# Patient Record
Sex: Male | Born: 1970 | Race: White | Hispanic: No | Marital: Married | Smoking: Current some day smoker
Health system: Southern US, Community
[De-identification: ages and names within clinical notes are randomized; demographics above are authoritative.]

## PROBLEM LIST (undated history)

## (undated) DIAGNOSIS — R51 Headache: Secondary | ICD-10-CM

## (undated) DIAGNOSIS — R519 Headache, unspecified: Secondary | ICD-10-CM

## (undated) DIAGNOSIS — I214 Non-ST elevation (NSTEMI) myocardial infarction: Secondary | ICD-10-CM

## (undated) DIAGNOSIS — E7800 Pure hypercholesterolemia, unspecified: Secondary | ICD-10-CM

## (undated) DIAGNOSIS — I2510 Atherosclerotic heart disease of native coronary artery without angina pectoris: Secondary | ICD-10-CM

## (undated) HISTORY — PX: CARDIAC CATHETERIZATION: SHX172

## (undated) HISTORY — PX: TONSILLECTOMY: SUR1361

## (undated) HISTORY — PX: CHOLECYSTECTOMY: SHX55

---
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
## 2015-07-28 DIAGNOSIS — I214 Non-ST elevation (NSTEMI) myocardial infarction: Secondary | ICD-10-CM

## 2015-07-28 HISTORY — DX: Non-ST elevation (NSTEMI) myocardial infarction: I21.4

## 2015-07-29 ENCOUNTER — Inpatient Hospital Stay (HOSPITAL_COMMUNITY)
Admission: EM | Admit: 2015-07-29 | Discharge: 2015-08-01 | DRG: 247 | Disposition: A | Discharge status: Home / Self Care | Payer: BLUE CROSS/BLUE SHIELD | Attending: Cardiology | Admitting: Cardiology

## 2015-07-29 ENCOUNTER — Emergency Department (HOSPITAL_COMMUNITY): Payer: BLUE CROSS/BLUE SHIELD

## 2015-07-29 ENCOUNTER — Encounter (HOSPITAL_COMMUNITY)
Admission: EM | Disposition: A | Discharge status: Home / Self Care | Payer: Self-pay | Source: Home / Self Care | Attending: Cardiology

## 2015-07-29 ENCOUNTER — Encounter (HOSPITAL_COMMUNITY): Payer: Self-pay | Admitting: Emergency Medicine

## 2015-07-29 DIAGNOSIS — E668 Other obesity: Secondary | ICD-10-CM | POA: Diagnosis present

## 2015-07-29 DIAGNOSIS — I2582 Chronic total occlusion of coronary artery: Secondary | ICD-10-CM | POA: Diagnosis not present

## 2015-07-29 DIAGNOSIS — R079 Chest pain, unspecified: Secondary | ICD-10-CM | POA: Diagnosis not present

## 2015-07-29 DIAGNOSIS — I214 Non-ST elevation (NSTEMI) myocardial infarction: Principal | ICD-10-CM | POA: Diagnosis present

## 2015-07-29 DIAGNOSIS — T464X5A Adverse effect of angiotensin-converting-enzyme inhibitors, initial encounter: Secondary | ICD-10-CM | POA: Diagnosis not present

## 2015-07-29 DIAGNOSIS — E785 Hyperlipidemia, unspecified: Secondary | ICD-10-CM | POA: Diagnosis present

## 2015-07-29 DIAGNOSIS — E669 Obesity, unspecified: Secondary | ICD-10-CM | POA: Diagnosis present

## 2015-07-29 DIAGNOSIS — G4733 Obstructive sleep apnea (adult) (pediatric): Secondary | ICD-10-CM | POA: Diagnosis present

## 2015-07-29 DIAGNOSIS — R001 Bradycardia, unspecified: Secondary | ICD-10-CM | POA: Diagnosis not present

## 2015-07-29 DIAGNOSIS — I2510 Atherosclerotic heart disease of native coronary artery without angina pectoris: Secondary | ICD-10-CM | POA: Diagnosis present

## 2015-07-29 DIAGNOSIS — Z955 Presence of coronary angioplasty implant and graft: Secondary | ICD-10-CM | POA: Insufficient documentation

## 2015-07-29 DIAGNOSIS — Z6838 Body mass index (BMI) 38.0-38.9, adult: Secondary | ICD-10-CM | POA: Diagnosis not present

## 2015-07-29 DIAGNOSIS — G459 Transient cerebral ischemic attack, unspecified: Secondary | ICD-10-CM | POA: Diagnosis present

## 2015-07-29 DIAGNOSIS — I25110 Atherosclerotic heart disease of native coronary artery with unstable angina pectoris: Secondary | ICD-10-CM | POA: Diagnosis present

## 2015-07-29 HISTORY — DX: Pure hypercholesterolemia, unspecified: E78.00

## 2015-07-29 HISTORY — DX: Non-ST elevation (NSTEMI) myocardial infarction: I21.4

## 2015-07-29 HISTORY — DX: Headache: R51

## 2015-07-29 HISTORY — DX: Headache, unspecified: R51.9

## 2015-07-29 HISTORY — DX: Atherosclerotic heart disease of native coronary artery without angina pectoris: I25.10

## 2015-07-29 HISTORY — PX: CARDIAC CATHETERIZATION: SHX172

## 2015-07-29 LAB — BASIC METABOLIC PANEL
Anion gap: 7 (ref 5–15)
BUN: 14 mg/dL (ref 6–20)
CHLORIDE: 105 mmol/L (ref 101–111)
CO2: 26 mmol/L (ref 22–32)
Calcium: 9.2 mg/dL (ref 8.9–10.3)
Creatinine, Ser: 0.94 mg/dL (ref 0.61–1.24)
GFR calc non Af Amer: >60 mL/min (ref >60)
Glucose, Bld: 116 mg/dL — ABNORMAL HIGH (ref 65–99)
POTASSIUM: 3.6 mmol/L (ref 3.5–5.1)
SODIUM: 138 mmol/L (ref 135–145)

## 2015-07-29 LAB — CBC
HEMATOCRIT: 42.3 % (ref 39.0–52.0)
Hemoglobin: 14 g/dL (ref 13.0–17.0)
MCH: 30.1 pg (ref 26.0–34.0)
MCHC: 33.1 g/dL (ref 30.0–36.0)
MCV: 91 fL (ref 78.0–100.0)
PLATELETS: 382 10*3/uL (ref 150–400)
RBC: 4.65 MIL/uL (ref 4.22–5.81)
RDW: 13.2 % (ref 11.5–15.5)
WBC: 11.3 10*3/uL — AB (ref 4.0–10.5)

## 2015-07-29 LAB — TROPONIN I: Troponin I: 7.88 ng/mL (ref <0.031)

## 2015-07-29 LAB — PROTIME-INR
INR: 1.02 (ref 0.00–1.49)
Prothrombin Time: 13.6 seconds (ref 11.6–15.2)

## 2015-07-29 LAB — APTT: APTT: 29 s (ref 24–37)

## 2015-07-29 SURGERY — LEFT HEART CATH AND CORONARY ANGIOGRAPHY

## 2015-07-29 MED ORDER — MORPHINE SULFATE (PF) 4 MG/ML IV SOLN
4.0000 mg | Freq: Once | INTRAVENOUS | Status: AC
  Administered 2015-07-29: 4 mg via INTRAVENOUS

## 2015-07-29 MED ORDER — SODIUM CHLORIDE 0.9 % IV SOLN: 250.0000 mL | INTRAVENOUS | Status: DC | PRN

## 2015-07-29 MED ORDER — MORPHINE SULFATE (PF) 4 MG/ML IV SOLN
INTRAVENOUS | Status: AC
  Filled 2015-07-29: qty 1

## 2015-07-29 MED ORDER — CLOPIDOGREL BISULFATE 75 MG PO TABS
300.0000 mg | ORAL_TABLET | Freq: Once | ORAL | Status: AC
Start: 2015-07-29 — End: 2015-07-29
  Administered 2015-07-29: 300 mg via ORAL
  Filled 2015-07-29: qty 4

## 2015-07-29 MED ORDER — VERAPAMIL HCL 2.5 MG/ML IV SOLN
INTRAVENOUS | Status: DC | PRN
  Administered 2015-07-29: 16:00:00 via INTRA_ARTERIAL

## 2015-07-29 MED ORDER — HEPARIN (PORCINE) IN NACL 100-0.45 UNIT/ML-% IJ SOLN
1600.0000 [IU]/h | INTRAMUSCULAR | Status: DC
  Administered 2015-07-30: 1400 [IU]/h via INTRAVENOUS
  Filled 2015-07-29 (×3): qty 250

## 2015-07-29 MED ORDER — NITROGLYCERIN IN D5W 200-5 MCG/ML-% IV SOLN: 10.0000 ug/min | INTRAVENOUS | Status: AC

## 2015-07-29 MED ORDER — SODIUM CHLORIDE 0.9 % WEIGHT BASED INFUSION: 1.0000 mL/kg/h | INTRAVENOUS | Status: AC

## 2015-07-29 MED ORDER — SODIUM CHLORIDE 0.9 % IJ SOLN: 3.0000 mL | INTRAMUSCULAR | Status: DC | PRN

## 2015-07-29 MED ORDER — HEPARIN SODIUM (PORCINE) 1000 UNIT/ML IJ SOLN
INTRAMUSCULAR | Status: AC
  Filled 2015-07-29: qty 1

## 2015-07-29 MED ORDER — FENTANYL CITRATE (PF) 100 MCG/2ML IJ SOLN
INTRAMUSCULAR | Status: AC
  Filled 2015-07-29: qty 4

## 2015-07-29 MED ORDER — ACETAMINOPHEN 325 MG PO TABS
650.0000 mg | ORAL_TABLET | ORAL | Status: DC | PRN
  Administered 2015-07-29: 19:00:00 650 mg via ORAL
  Filled 2015-07-29: qty 2

## 2015-07-29 MED ORDER — ASPIRIN 81 MG PO CHEW
CHEWABLE_TABLET | ORAL | Status: AC
  Filled 2015-07-29: qty 4

## 2015-07-29 MED ORDER — OXYCODONE-ACETAMINOPHEN 5-325 MG PO TABS
1.0000 | ORAL_TABLET | ORAL | Status: DC | PRN
  Administered 2015-07-29 – 2015-07-31 (×3): 2 via ORAL
  Filled 2015-07-29 (×3): qty 2

## 2015-07-29 MED ORDER — CLOPIDOGREL BISULFATE 75 MG PO TABS
75.0000 mg | ORAL_TABLET | Freq: Every day | ORAL | Status: DC
  Administered 2015-07-30 – 2015-08-01 (×3): 75 mg via ORAL
  Filled 2015-07-29 (×4): qty 1

## 2015-07-29 MED ORDER — ASPIRIN 81 MG PO CHEW
324.0000 mg | CHEWABLE_TABLET | Freq: Once | ORAL | Status: AC
  Administered 2015-07-29: 324 mg via ORAL

## 2015-07-29 MED ORDER — SODIUM CHLORIDE 0.9 % IJ SOLN
3.0000 mL | Freq: Two times a day (BID) | INTRAMUSCULAR | Status: DC
Start: 2015-07-29 — End: 2015-08-01
  Administered 2015-07-30 – 2015-07-31 (×2): 3 mL via INTRAVENOUS

## 2015-07-29 MED ORDER — LIDOCAINE HCL (PF) 1 % IJ SOLN
INTRAMUSCULAR | Status: DC | PRN
  Administered 2015-07-29: 17:00:00

## 2015-07-29 MED ORDER — MIDAZOLAM HCL 2 MG/2ML IJ SOLN
INTRAMUSCULAR | Status: DC | PRN
  Administered 2015-07-29: 1 mg via INTRAVENOUS

## 2015-07-29 MED ORDER — ACTIVE PARTNERSHIP FOR HEALTH OF YOUR HEART BOOK
Freq: Once | Status: AC
  Administered 2015-07-30: 04:00:00
  Filled 2015-07-29: qty 1

## 2015-07-29 MED ORDER — NITROGLYCERIN IN D5W 200-5 MCG/ML-% IV SOLN
5.0000 ug/min | Freq: Once | INTRAVENOUS | Status: AC
  Administered 2015-07-29: 5 ug/min via INTRAVENOUS
  Filled 2015-07-29: qty 250

## 2015-07-29 MED ORDER — HEPARIN (PORCINE) IN NACL 100-0.45 UNIT/ML-% IJ SOLN
1200.0000 [IU]/h | INTRAMUSCULAR | Status: DC
  Administered 2015-07-29: 1200 [IU]/h via INTRAVENOUS
  Filled 2015-07-29: qty 250

## 2015-07-29 MED ORDER — HEPARIN BOLUS VIA INFUSION
4000.0000 [IU] | Freq: Once | INTRAVENOUS | Status: AC
Start: 2015-07-29 — End: 2015-07-29
  Administered 2015-07-29: 4000 [IU] via INTRAVENOUS

## 2015-07-29 MED ORDER — NITROGLYCERIN 0.4 MG SL SUBL
0.4000 mg | SUBLINGUAL_TABLET | SUBLINGUAL | Status: DC | PRN
  Administered 2015-07-29: 0.4 mg via SUBLINGUAL

## 2015-07-29 MED ORDER — ONDANSETRON HCL 4 MG/2ML IJ SOLN: 4.0000 mg | Freq: Four times a day (QID) | INTRAMUSCULAR | Status: DC | PRN

## 2015-07-29 MED ORDER — NITROGLYCERIN 0.4 MG SL SUBL
SUBLINGUAL_TABLET | SUBLINGUAL | Status: AC
  Administered 2015-07-29: 0.4 mg via SUBLINGUAL
  Filled 2015-07-29: qty 1

## 2015-07-29 MED ORDER — ROSUVASTATIN CALCIUM 10 MG PO TABS
10.0000 mg | ORAL_TABLET | Freq: Every day | ORAL | Status: DC
  Administered 2015-07-30 – 2015-08-01 (×3): 10 mg via ORAL
  Filled 2015-07-29 (×3): qty 1

## 2015-07-29 MED ORDER — FENTANYL CITRATE (PF) 100 MCG/2ML IJ SOLN
INTRAMUSCULAR | Status: DC | PRN
  Administered 2015-07-29 (×2): 25 ug via INTRAVENOUS

## 2015-07-29 MED ORDER — MIDAZOLAM HCL 2 MG/2ML IJ SOLN
INTRAMUSCULAR | Status: AC
  Filled 2015-07-29: qty 4

## 2015-07-29 MED ORDER — LIDOCAINE HCL (PF) 1 % IJ SOLN
INTRAMUSCULAR | Status: AC
  Filled 2015-07-29: qty 30

## 2015-07-29 MED ORDER — ASPIRIN 81 MG PO CHEW
81.0000 mg | CHEWABLE_TABLET | Freq: Every day | ORAL | Status: DC
  Administered 2015-07-30 – 2015-08-01 (×3): 81 mg via ORAL
  Filled 2015-07-29 (×3): qty 1

## 2015-07-29 MED ORDER — HEPARIN SODIUM (PORCINE) 1000 UNIT/ML IJ SOLN
INTRAMUSCULAR | Status: DC | PRN
  Administered 2015-07-29: 6000 [IU] via INTRAVENOUS

## 2015-07-29 MED ORDER — VERAPAMIL HCL 2.5 MG/ML IV SOLN
INTRAVENOUS | Status: AC
  Filled 2015-07-29: qty 2

## 2015-07-29 SURGICAL SUPPLY — 9 items
CATH INFINITI 5 FR JL3.5 (CATHETERS) ×2 IMPLANT
CATH INFINITI JR4 5F (CATHETERS) ×2 IMPLANT
DEVICE RAD COMP TR BAND LRG (VASCULAR PRODUCTS) ×2 IMPLANT
GLIDESHEATH SLEND A-KIT 6F 22G (SHEATH) ×2 IMPLANT
KIT HEART LEFT (KITS) ×2 IMPLANT
PACK CARDIAC CATHETERIZATION (CUSTOM PROCEDURE TRAY) ×2 IMPLANT
TRANSDUCER W/STOPCOCK (MISCELLANEOUS) ×2 IMPLANT
TUBING CIL FLEX 10 FLL-RA (TUBING) ×2 IMPLANT
WIRE SAFE-T 1.5MM-J .035X260CM (WIRE) ×2 IMPLANT

## 2015-07-29 NOTE — H&P (View-Only) (Signed)
    Consulting cardiologist: Dr Tran Arzuaga MD  Clinical Summary Alex Barnes is a 44 y.o.male with no known cardiac disease presents with chest pain. 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 10/10 pressure pain at that time with some SOB, lasted about 45 minutes. Became very diaphoretic. Patient did not seek medical attention. Pain on and off throughout the night and into the morning, similar in character but more mild in severity.    No Known Allergies  Medications Scheduled Medications:     Infusions: . heparin 1,200 Units/hr (07/29/15 1404)     PRN Medications:  nitroGLYCERIN   Past Medical History  Diagnosis Date  . Hypercholesteremia     Past Surgical History  Procedure Laterality Date  . Tonsillectomy    . Wisdom tooth extraction    . Cardiac catheterization      History reviewed. No pertinent family history.  Social History Alex Barnes reports that he has never smoked. He does not have any smokeless tobacco history on file. Alex Barnes reports that he drinks alcohol.  Review of Systems CONSTITUTIONAL: No weight loss, fever, chills, weakness or fatigue.  HEENT: Eyes: No visual loss, blurred vision, double vision or yellow sclerae. No hearing loss, sneezing, congestion, runny nose or sore throat.  SKIN: No rash or itching.  CARDIOVASCULAR: per HPI RESPIRATORY: No shortness of breath, cough or sputum.  GASTROINTESTINAL: No anorexia, nausea, vomiting or diarrhea. No abdominal pain or blood.  GENITOURINARY: no polyuria, no dysuria NEUROLOGICAL: No headache, dizziness, syncope, paralysis, ataxia, numbness or tingling in the extremities. No change in bowel or bladder control.  MUSCULOSKELETAL: No muscle, back pain, joint pain or stiffness.  HEMATOLOGIC: No anemia, bleeding or bruising.  LYMPHATICS: No enlarged nodes. No history of splenectomy.  PSYCHIATRIC: No history of depression or  anxiety.      Physical Examination Blood pressure 127/75, pulse 61, temperature 98.6 F (37 C), temperature source Oral, resp. rate 16, height 5' 10" (1.778 m), weight 265 lb (120.203 kg), SpO2 98 %. No intake or output data in the 24 hours ending 07/29/15 1439  HEENT: sclera clear  Cardiovascular: RRR, no m/r/g, no JVD  Respiratory:CTAB  GI: abdomen soft, NT, ND  MSK: no LE edema  Neuro: no focal deficits  Psych: appropriate affect   Lab Results  Basic Metabolic Panel:  Recent Labs Lab 07/29/15 1216  NA 138  K 3.6  CL 105  CO2 26  GLUCOSE 116*  BUN 14  CREATININE 0.94  CALCIUM 9.2    Liver Function Tests: No results for input(s): AST, ALT, ALKPHOS, BILITOT, PROT, ALBUMIN in the last 168 hours.  CBC:  Recent Labs Lab 07/29/15 1216  WBC 11.3*  HGB 14.0  HCT 42.3  MCV 91.0  PLT 382    Cardiac Enzymes:  Recent Labs Lab 07/29/15 1216  TROPONINI 7.88*    BNP: Invalid input(s): POCBNP    Impression/Recommendations 1. NSTEMI - EKG shows Q-waves in inferior leads, no acute ST segement changes, overall the ST's appear nonspecific. Significant troponin elevation. Suspect possible subacute MI, has been approx 17 hours since symptoms onset.  - still with lingering chest pain on and off on heparin and gtt drip - will plan for transfer to [place] for cath, given ongoing symptoms have arranged cath be done today.     Alex Barnes, M.D  

## 2015-07-29 NOTE — ED Notes (Signed)
Cardiologist at bedside.  

## 2015-07-29 NOTE — ED Notes (Signed)
CRITICAL VALUE ALERT  Critical value received:  Troponin 7.88  Date of notification: 07/29/2015  Time of notification:  13:33  Critical value read back: Yes   Nurse who received alert:  Merleen Milliner RN  MD notified (1st page):  MD Zammit/Rancour

## 2015-07-29 NOTE — Progress Notes (Signed)
ANTICOAGULATION CONSULT NOTE - Initial Consult  Pharmacy Consult for Heparin Indication: chest pain/ACS  No Known Allergies  Patient Measurements: Height:  (177.8 cm) Weight: 265 lb (120.203 kg) IBW/kg (Calculated) : 73 Heparin Dosing Weight: 100kg  Vital Signs: Temp: 98.6 F (37 C) (09/26 1147) Temp Source: Oral (09/26 1147) BP: 124/68 mmHg (09/26 1300) Pulse Rate: 53 (09/26 1300)  Labs:  Recent Labs  07/29/15 1216  HGB 14.0  HCT 42.3  PLT 382  CREATININE 0.94  TROPONINI 7.88*    Estimated Creatinine Clearance: 133.1 mL/min (by C-G formula based on Cr of 0.94).   Medical History: Past Medical History  Diagnosis Date  . Hypercholesteremia     Medications:  Crestor  daily  Assessment: 44 yo admitted with sudden onset chest pain and radiating into his left arm. Also, c/o SOB, cold sweats, palpitations, and nausea.  Initiate Heparin protocol for ACS/chest pain.  Goal of Therapy:  Heparin level 0.3-0.7 units/ml Monitor platelets by anticoagulation protocol: Yes   Plan:  Give 4000 units bolus x 1 Start heparin infusion at 1200 units/hr Check anti-Xa level in 6 hours and daily while on heparin Continue to monitor H&H and platelets   Elder Cyphers, BS Loura Back, BCPS Clinical Pharmacist Pager [phone]  07/29/2015,1:50 PM

## 2015-07-29 NOTE — Consult Note (Addendum)
    Consulting cardiologist: Dr Dina Rich MD  Clinical Summary Alex Barnes is a 44 y.o.male with no known cardiac disease presents with chest pain. 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 10/10 pressure pain at that time with some SOB, lasted about 45 minutes. Became very diaphoretic. Patient did not seek medical attention. Pain on and off throughout the night and into the morning, similar in character but more mild in severity.    No Known Allergies  Medications Scheduled Medications:     Infusions: . heparin 1,200 Units/hr (07/29/15 1404)     PRN Medications:  nitroGLYCERIN   Past Medical History  Diagnosis Date  . Hypercholesteremia     Past Surgical History  Procedure Laterality Date  . Tonsillectomy    . Wisdom tooth extraction    . Cardiac catheterization      History reviewed. No pertinent family history.  Social History Mr. Stockinger reports that he has never smoked. He does not have any smokeless tobacco history on file. Mr. Stanzione reports that he drinks alcohol.  Review of Systems CONSTITUTIONAL: No weight loss, fever, chills, weakness or fatigue.  HEENT: Eyes: No visual loss, blurred vision, double vision or yellow sclerae. No hearing loss, sneezing, congestion, runny nose or sore throat.  SKIN: No rash or itching.  CARDIOVASCULAR: per HPI RESPIRATORY: No shortness of breath, cough or sputum.  GASTROINTESTINAL: No anorexia, nausea, vomiting or diarrhea. No abdominal pain or blood.  GENITOURINARY: no polyuria, no dysuria NEUROLOGICAL: No headache, dizziness, syncope, paralysis, ataxia, numbness or tingling in the extremities. No change in bowel or bladder control.  MUSCULOSKELETAL: No muscle, back pain, joint pain or stiffness.  HEMATOLOGIC: No anemia, bleeding or bruising.  LYMPHATICS: No enlarged nodes. No history of splenectomy.  PSYCHIATRIC: No history of depression or  anxiety.      Physical Examination Blood pressure 127/75, pulse 61, temperature 98.6 F (37 C), temperature source Oral, resp. rate 16, height  (1.778 m), weight 265 lb (120.203 kg), SpO2 98 %. No intake or output data in the 24 hours ending 07/29/15 1439  HEENT: sclera clear  Cardiovascular: RRR, no m/r/g, no JVD  Respiratory:CTAB  GI: abdomen soft, NT, ND  MSK: no LE edema  Neuro: no focal deficits  Psych: appropriate affect   Lab Results  Basic Metabolic Panel:  Recent Labs Lab 07/29/15 1216  NA 138  K 3.6  CL 105  CO2 26  GLUCOSE 116*  BUN 14  CREATININE 0.94  CALCIUM 9.2    Liver Function Tests: No results for input(s): AST, ALT, ALKPHOS, BILITOT, PROT, ALBUMIN in the last 168 hours.  CBC:  Recent Labs Lab 07/29/15 1216  WBC 11.3*  HGB 14.0  HCT 42.3  MCV 91.0  PLT 382    Cardiac Enzymes:  Recent Labs Lab 07/29/15 1216  TROPONINI 7.88*    BNP: Invalid input(s): POCBNP    Impression/Recommendations 1. NSTEMI - EKG shows Q-waves in inferior leads, no acute ST segement changes, overall the ST's appear nonspecific. Significant troponin elevation. Suspect possible subacute MI, has been approx 17 hours since symptoms onset.  - still with lingering chest pain on and off on heparin and gtt drip - will plan for transfer to [facility] for cath, given ongoing symptoms have arranged cath be done today.     Dina Rich, M.D

## 2015-07-29 NOTE — Progress Notes (Signed)
ANTICOAGULATION CONSULT NOTE - Initial Consult  Pharmacy Consult for Heparin Indication: CAD s/p cath 9/26  No Known Allergies  Patient Measurements: Height:  (177.8 cm) Weight: 265 lb (120.203 kg) IBW/kg (Calculated) : 73 Heparin Dosing Weight: 99 kg  Vital Signs: Temp: 98.1 F (36.7 C) (09/26 1703) Temp Source: Oral (09/26 1703) BP: 126/77 mmHg (09/26 1703) Pulse Rate: 70 (09/26 1703)  Labs:  Recent Labs  07/29/15 1200 07/29/15 1216  HGB  --  14.0  HCT  --  42.3  PLT  --  382  APTT 29  --   LABPROT 13.6  --   INR 1.02  --   CREATININE  --  0.94  TROPONINI  --  7.88*    Estimated Creatinine Clearance: 133.1 mL/min (by C-G formula based on Cr of 0.94).   Medical History: Past Medical History  Diagnosis Date  . Hypercholesteremia   . NSTEMI (non-ST elevated myocardial infarction) 07/28/2015  . Headache     "weekly" (07/29/2015)    Medications:  Prescriptions prior to admission  Medication Sig Dispense Refill Last Dose  . rosuvastatin (CRESTOR) 10 MG tablet Take 10 mg by mouth daily.  6 07/29/2015 at Unknown time    Assessment: 44 yo M with CP/pressure, some SOB that has been on/off for the last 42 hours. [facility]en at [facility] with EKG consistent with NSTEMI and transferred to [facility] for cath.  Cath >> total occlusion of the RCA beyond the origin of the PDA, 75-85% stenosis of distal Cx, patent LAD, EF 55-60%.  TR band removed 07/29/2015 at 2000.  Pharmacy asked to restart heparin pending plans for PCI.  Goal of Therapy:  Heparin level 0.3-0.7 units/ml Monitor platelets by anticoagulation protocol: Yes   Plan:  Follow-up LOT for heparin post-cath At 0400 9/27, begin heparin infusion at 1400 units/hr Heparin level at noon 9/27 Heparin level and CBC daily while on heparin  [employer], Pharm.D., BCPS Clinical Pharmacist Pager [phone] 07/29/2015 8:12 PM

## 2015-07-29 NOTE — ED Provider Notes (Signed)
CSN: [id number]     Arrival date & time 07/29/15  1138 History  This chart was scribed for Alex Octave, MD by Tanda Rockers, ED Scribe. This patient was seen in room APA05/[facility] and the patient's care was started at 11:57 AM.    Chief Complaint  Patient presents with  . Chest Pain   The history is provided by the patient. No language interpreter was used.     HPI Comments: Alex Barnes is a 44 y.o. male who presents to the Emergency Department complaining of sudden onset, constant, 10/10, pressure, mid chest pain that began last night while carrying sandbags. Pt states that the pain began radiating into his left arm as well. Pt also complains of shortness of breath, cold sweats, palpitations, and nausea. He mentions that the pain was in his central chest last night but it has now moved to the left side. The pain has mildly subsided since last night with pt currently r. Pt has not taken any aspirin. He denies any other associated symptoms. Pt had stress test approximately and cardiac cath approximately 10 years ago without any acute findings.    Past Medical History  Diagnosis Date  . Hypercholesteremia    Past Surgical History  Procedure Laterality Date  . Tonsillectomy    . Wisdom tooth extraction    . Cardiac catheterization     History reviewed. No pertinent family history. Social History  Substance Use Topics  . Smoking status: Never Smoker   . Smokeless tobacco: None  . Alcohol Use: Yes     Comment: twice a week (4-6 drinks)    Review of Systems  A complete 10 system review of systems was obtained and all systems are negative except as noted in the HPI and PMH.    Allergies  Review of patient's allergies indicates no known allergies.  Home Medications   Prior to Admission medications   Medication Sig Start Date End Date Taking? Authorizing Provider  rosuvastatin (CRESTOR) 10 MG tablet Take 10 mg by mouth daily. 04/29/15  Yes Historical Provider, MD   Triage  Vitals: BP 136/75 mmHg  Pulse 68  Temp(Src) 98.6 F (37 C) (Oral)  Resp 20  Ht  (1.778 m)  Wt 265 lb (120.203 kg)  BMI 38.02 kg/m2  SpO2 97%   Physical Exam  Constitutional: He is oriented to person, place, and time. He appears well-developed and well-nourished. No distress.  HENT:  Head: Normocephalic and atraumatic.  Mouth/Throat: Oropharynx is clear and moist. No oropharyngeal exudate.  Eyes: Conjunctivae and EOM are normal. Pupils are equal, round, and reactive to light.  Neck: Normal range of motion. Neck supple.  No meningismus.  Cardiovascular: Normal rate, regular rhythm, normal heart sounds and intact distal pulses.   No murmur heard. Pulmonary/Chest: Effort normal and breath sounds normal. No respiratory distress.  Abdominal: Soft. There is no tenderness. There is no rebound and no guarding.  Musculoskeletal: Normal range of motion. He exhibits no edema or tenderness.  Neurological: He is alert and oriented to person, place, and time. No cranial nerve deficit. He exhibits normal muscle tone. Coordination normal.  No ataxia on finger to nose bilaterally. No pronator drift. 5/5 strength throughout. CN 2-12 intact. Negative Romberg. Equal grip strength. Sensation intact. Gait is normal.   Skin: Skin is warm.  Psychiatric: He has a normal mood and affect. His behavior is normal.  Nursing note and vitals reviewed.   ED Course  Procedures (including critical care time)  DIAGNOSTIC STUDIES:  Oxygen Saturation is 97% on RA, normal by my interpretation.    COORDINATION OF CARE: 12:03 PM-Discussed treatment plan which includes consult with cardiology, CXR, BMP, CBC, Troponin with pt at bedside and pt agreed to plan.   Labs Review Labs Reviewed  BASIC METABOLIC PANEL - Abnormal; Notable for the following:    Glucose, Bld 116 (*)    All other components within normal limits  CBC - Abnormal; Notable for the following:    WBC 11.3 (*)    All other components within  normal limits  TROPONIN I - Abnormal; Notable for the following:    Troponin I 7.88 (*)    All other components within normal limits  PROTIME-INR  APTT  CBC    Imaging Review Dg Chest 2 View  07/29/2015   CLINICAL DATA:  Chest pain and pressure.  EXAM: CHEST  2 VIEW  COMPARISON:  None.  FINDINGS: The heart size and mediastinal contours are within normal limits. Both lungs are clear. The visualized skeletal structures are unremarkable.  IMPRESSION: Normal chest.   Electronically Signed   By: Francene Boyers M.D.   On: 07/29/2015 12:33   I have personally reviewed and evaluated these images and lab results as part of my medical decision-making.   EKG Interpretation   Date/Time:  Monday July 29 2015 11:50:55 EDT Ventricular Rate:  60 PR Interval:  174 QRS Duration: 100 QT Interval:  426 QTC Calculation: 426 R Axis:   17 Text Interpretation:  Normal sinus rhythm Possible Inferior infarct , age  undetermined Abnormal ECG No significant change was found d/w Dr. Wyline Mood  Confirmed by Manus Gunning  MD, STEPHEN [phone]) on 07/29/2015 12:16:50 PM      MDM   Final diagnoses:  NSTEMI (non-ST elevated myocardial infarction)   Patient with central chest pain into left arm that onset yesterday while lifting sand bags. Has persisted all night. Associated with shortness of breath and nausea.  Concern for angina. His EKG is concerning for Q waves inferiorly with slight ST segment elevation in lead 3 and aVF. This was discussed with Dr. Wyline Mood. He does not feel this needs STEMI criteria.  Labs, aspirin, nitroglycerin, chest x-ray.  Troponin elevated at 7.8. IV heparin, IV nitroglycerin, rediscussed with Dr. Wyline Mood.  Nitro and heparin continued with improvement in pain.  Dr. Wyline Mood has seen patient and made arrangements for transfer to [facility] for emergent catheterization.  Repeat EKG without STEMI.   CRITICAL CARE Performed by: Alex Barnes Total critical care time: 35 Critical care time  was exclusive of separately billable procedures and treating other patients. Critical care was necessary to treat or prevent imminent or life-threatening deterioration. Critical care was time spent personally by me on the following activities: development of treatment plan with patient and/or surrogate as well as nursing, discussions with consultants, evaluation of patient's response to treatment, examination of patient, obtaining history from patient or surrogate, ordering and performing treatments and interventions, ordering and review of laboratory studies, ordering and review of radiographic studies, pulse oximetry and re-evaluation of patient's condition.   I personally performed the services described in this documentation, which was scribed in my presence. The recorded information has been reviewed and is accurate.     Alex Octave, MD 07/29/15 [phone]

## 2015-07-29 NOTE — Interval H&P Note (Signed)
Cath Lab Visit (complete for each Cath Lab visit)  Clinical Evaluation Leading to the Procedure:   ACS: Yes.    Non-ACS:    Anginal Classification: CCS IV  Anti-ischemic medical therapy: Minimal Therapy (1 class of medications)  Non-Invasive Test Results: No non-invasive testing performed  Prior CABG: No previous CABG      History and Physical Interval Note:  07/29/2015 3:32 PM  Alex Barnes  has presented today for surgery, with the diagnosis of cp  The various methods of treatment have been discussed with the patient and family. After consideration of risks, benefits and other options for treatment, the patient has consented to  Procedure(s): Left Heart Cath and Coronary Angiography (N/A) as a surgical intervention .  The patient's history has been reviewed, patient examined, no change in status, stable for surgery.  I have reviewed the patient's chart and labs.  Questions were answered to the patient's satisfaction.     Lesleigh Noe

## 2015-07-29 NOTE — ED Notes (Signed)
PT c/o left sided chest pain with SOB on exertion intermittently after doing yard work last night.

## 2015-07-29 NOTE — Progress Notes (Signed)
Full consult to follow. Patient presents with chest pain. EKG with inferior Q waves without acute ishcemic changes. Ongoing chest pain, patient to start hep gtt and NG gtt. We will arrange transfer to [facility] for cath today.   Dominga Ferry MD

## 2015-07-30 ENCOUNTER — Encounter (HOSPITAL_COMMUNITY): Payer: Self-pay | Admitting: Interventional Cardiology

## 2015-07-30 DIAGNOSIS — R001 Bradycardia, unspecified: Secondary | ICD-10-CM | POA: Diagnosis not present

## 2015-07-30 DIAGNOSIS — T464X5A Adverse effect of angiotensin-converting-enzyme inhibitors, initial encounter: Secondary | ICD-10-CM | POA: Diagnosis not present

## 2015-07-30 DIAGNOSIS — I2582 Chronic total occlusion of coronary artery: Secondary | ICD-10-CM | POA: Diagnosis present

## 2015-07-30 DIAGNOSIS — R079 Chest pain, unspecified: Secondary | ICD-10-CM | POA: Diagnosis present

## 2015-07-30 DIAGNOSIS — Z6838 Body mass index (BMI) 38.0-38.9, adult: Secondary | ICD-10-CM | POA: Diagnosis not present

## 2015-07-30 DIAGNOSIS — G459 Transient cerebral ischemic attack, unspecified: Secondary | ICD-10-CM | POA: Diagnosis not present

## 2015-07-30 DIAGNOSIS — I25110 Atherosclerotic heart disease of native coronary artery with unstable angina pectoris: Secondary | ICD-10-CM | POA: Diagnosis not present

## 2015-07-30 DIAGNOSIS — I2510 Atherosclerotic heart disease of native coronary artery without angina pectoris: Secondary | ICD-10-CM | POA: Diagnosis present

## 2015-07-30 DIAGNOSIS — E668 Other obesity: Secondary | ICD-10-CM | POA: Diagnosis not present

## 2015-07-30 DIAGNOSIS — G4733 Obstructive sleep apnea (adult) (pediatric): Secondary | ICD-10-CM | POA: Diagnosis present

## 2015-07-30 DIAGNOSIS — E785 Hyperlipidemia, unspecified: Secondary | ICD-10-CM

## 2015-07-30 DIAGNOSIS — I214 Non-ST elevation (NSTEMI) myocardial infarction: Secondary | ICD-10-CM | POA: Diagnosis present

## 2015-07-30 LAB — CBC
HEMATOCRIT: 39.2 % (ref 39.0–52.0)
Hemoglobin: 12.9 g/dL — ABNORMAL LOW (ref 13.0–17.0)
MCH: 30.4 pg (ref 26.0–34.0)
MCHC: 32.9 g/dL (ref 30.0–36.0)
MCV: 92.5 fL (ref 78.0–100.0)
PLATELETS: 325 10*3/uL (ref 150–400)
RBC: 4.24 MIL/uL (ref 4.22–5.81)
RDW: 13.6 % (ref 11.5–15.5)
WBC: 9.8 10*3/uL (ref 4.0–10.5)

## 2015-07-30 LAB — PLATELET INHIBITION P2Y12: PLATELET FUNCTION P2Y12: 218 [PRU] (ref 194–418)

## 2015-07-30 LAB — HEPARIN LEVEL (UNFRACTIONATED)
HEPARIN UNFRACTIONATED: 0.25 [IU]/mL — AB (ref 0.30–0.70)
HEPARIN UNFRACTIONATED: 0.3 [IU]/mL (ref 0.30–0.70)

## 2015-07-30 MED ORDER — LISINOPRIL 5 MG PO TABS
2.5000 mg | ORAL_TABLET | Freq: Every day | ORAL | Status: DC
  Administered 2015-07-30 – 2015-08-01 (×3): 2.5 mg via ORAL
  Filled 2015-07-30 (×3): qty 1

## 2015-07-30 NOTE — Progress Notes (Signed)
   Spoke to wife and husband.  Assuming no recurrence of ischemic pain over the next 48-72 hours, will anticipate the patient being discharged and returning for PCI on circumflex at 7:30 AM on October 6th.

## 2015-07-30 NOTE — Progress Notes (Addendum)
ANTICOAGULATION CONSULT NOTE - Follow Up Consult  Pharmacy Consult for heparin Indication: CAD post cath, NSTEMI  No Known Allergies  Patient Measurements: Height:  (177.8 cm) Weight: 266 lb 12.1 oz (121 kg) IBW/kg (Calculated) : 73 Heparin Dosing Weight: 99 kg  Vital Signs: Temp: 98.1 F (36.7 C) (09/27 0800) Temp Source: Oral (09/27 0800) BP: 120/69 mmHg (09/27 0800) Pulse Rate: 61 (09/27 0800)  Labs:  Recent Labs  07/29/15 1200 07/29/15 1216 07/30/15 0315 07/30/15 1055  HGB  --  14.0 12.9*  --   HCT  --  42.3 39.2  --   PLT  --  382 325  --   APTT 29  --   --   --   LABPROT 13.6  --   --   --   INR 1.02  --   --   --   HEPARINUNFRC  --   --   --  0.25*  CREATININE  --  0.94  --   --   TROPONINI  --  7.88*  --   --     Estimated Creatinine Clearance: 133.5 mL/min (by C-G formula based on Cr of 0.94).   Medications:  Infusions:  . heparin 1,400 Units/hr (07/30/15 0356)    Assessment: 44 y/o male with CP/pressure, some SOB that has been on/off for the last 18 hours. Seen at [facility] with EKG consistent with NSTEMI and transferred to [facility] for cath.  Cath >> total occlusion of the RCA beyond the origin of the PDA, 75-85% stenosis of distal Cx, patent LAD, EF 55-60%. Plan is for discharge and to return for PCI on circumflex on 10/6 if no recurrent chest pain.  Heparin level is subtherapeutic at 0.25 on 1400 units/hr. No bleeding noted, Hb down to 12.9, platelets are stable.   Goal of Therapy:  Heparin level 0.3-0.7 units/ml Monitor platelets by anticoagulation protocol: Yes   Plan:  - Increase heparin drip to 1600 units/hr - 6 hr heparin level - Daily heparin level and CBC - Monitor for s/sx of bleeding  [facility], [place].D., BCPS Clinical Pharmacist Pager: [phone] 07/30/2015 1:49 PM   ADDN: F/u HL is therapeutic at 0.3 on heparin 1600 units/hr. No issues with infusion or bleeding noted. Continue current rate and recheck with AM labs.  Arlean Hopping. Newman Pies, PharmD Clinical Pharmacist Pager [phone]

## 2015-07-30 NOTE — Progress Notes (Signed)
CARDIAC REHAB PHASE I   PRE:  Rate/Rhythm: 66 SR  BP:  Supine: 127/69  Sitting: 132/80  Standing:    SaO2:   MODE:  Ambulation: 700 ft   POST:  Rate/Rhythm: 73 SR  BP:  Supine:   Sitting: 122/61  Standing:    SaO2:  0850-0940 Pt walked 700 ft with steady gait. No increased CP or pressure. To recliner after walk. Began MI ed with pt who voiced understanding. Discussed MI restrictions, NTG use and explained that CRP 2 [place] will be part of recovery. Friend arrived so will continue ed tomorrow with pt and wife.   Luetta Nutting, RN BSN  07/30/2015 9:35 AM

## 2015-07-30 NOTE — Progress Notes (Signed)
TR BAND REMOVAL  LOCATION:  right radial  DEFLATED PER PROTOCOL:  Yes.    TIME BAND OFF / DRESSING APPLIED:   2000   SITE UPON ARRIVAL:   Level 0  SITE AFTER BAND REMOVAL:  Level 0  REVERSE ALLEN'S TEST:    positive  CIRCULATION SENSATION AND MOVEMENT:  Within Normal Limits  Yes.    COMMENTS:    

## 2015-07-30 NOTE — Progress Notes (Signed)
Patient Name: Alex Barnes Date of Encounter: 07/30/2015  Active Problems:   NSTEMI (non-ST elevated myocardial infarction)   Primary Cardiologist: Dr. Wyline Mood Patient Profile: 44 yo male w/ HLD and no known CAD history presented to [facility] ED for chest pain on 07/29/2015. Taken to cath lab which showed recent acute inferolateral infarction and total occlusion of the RCA beyond the origin of the PDA with the left ventricular branch supplied by collaterals from the left atrial recurrent branch of the distal circumflex. A 75-80% mid to distal circumflex stenosis also noted.   SUBJECTIVE: Reports still having some left pectoral region pain, that has decreased in intensity since yesterday. Denies any palpitations or shortness of breath. Reports no complications from his procedure yesterday.  OBJECTIVE Filed Vitals:   07/30/15 0200 07/30/15 0300 07/30/15 0400 07/30/15 0500  BP: 119/61 114/59 114/65 132/52  Pulse: 48 48 46 49  Temp:   98.1 F (36.7 C)   TempSrc:   Oral   Resp:   16   Height:      Weight:   266 lb 12.1 oz (121 kg)   SpO2: 100% 100% 99% 99%    Intake/Output Summary (Last 24 hours) at 07/30/15 0732 Last data filed at 07/29/15 2300  Gross per 24 hour  Intake    240 ml  Output      0 ml  Net    240 ml   Filed Weights   07/29/15 1147 07/30/15 0400  Weight: 265 lb (120.203 kg) 266 lb 12.1 oz (121 kg)    PHYSICAL EXAM General: Well developed, well nourished, male in no acute distress. Head: Normocephalic, atraumatic.  Neck: Supple without bruits, JVD not elevated. Lungs:  Resp regular and unlabored, CTA without wheezing or rales. Heart: RRR, S1, S2, no S3, S4, or murmur; no rub. Abdomen: Soft, non-tender, non-distended with normoactive bowel sounds. No hepatomegaly. No rebound/guarding. No obvious abdominal masses. Extremities: No clubbing, cyanosis, or edema. Distal pedal pulses are 2+ bilaterally. Right radial cath site without ecchymosis or hematoma. Mild  tenderness at site. Neuro: Alert and oriented X 3. Moves all extremities spontaneously. Psych: Normal affect.   LABS: CBC: Recent Labs  07/29/15 1216 07/30/15 0315  WBC 11.3* 9.8  HGB 14.0 12.9*  HCT 42.3 39.2  MCV 91.0 92.5  PLT 382 325   INR: Recent Labs  07/29/15 1200  INR 1.02   Basic Metabolic Panel: Recent Labs  07/29/15 1216  NA 138  K 3.6  CL 105  CO2 26  GLUCOSE 116*  BUN 14  CREATININE 0.94  CALCIUM 9.2   Cardiac Enzymes: Recent Labs  07/29/15 1216  TROPONINI 7.88*    TELE:   NSR with rate in 50's - 70's. Bradycardiac at times with rate into mid-40's.   ECHO: 1. Ost 2nd Mrg to 2nd Mrg lesion, 85% stenosed. 2. Prox Cx lesion, 75% stenosed. 3. Post Atrio lesion, 100% stenosed. 4. Ost LAD to Prox LAD lesion, 30% stenosed. 5. Mid RCA to Dist RCA lesion, 40% stenosed.   Recent acute inferolateral infarction now greater than 18 hours old. Angiography demonstrates total occlusion of the RCA beyond the origin of the PDA. The left ventricular branch is supplied by collaterals from the left atrial recurrent branch of the distal circumflex.  75-80% mid to distal circumflex stenosis. High-grade complex stenosis in the second obtuse marginal branch which is small to moderate in size.  Widely patent LAD and a dominant branching ramus intermedius.  Overall normal LV  function with EF 55-60%, and inferobasal hypokinesis.   Recommendations:  Dual antiplatelet therapy.  Will eventually need PCI on the mid circumflex and second obtuse marginal branch. Not undertaken today due to acute infarction in the right coronary territory. PCI on the circumflex can be done during this hospital stay or preferably in 7-10 days as needed elective staged procedure.  High-dose statin therapy  Phase I cardiac rehabilitation in a.m.  IV nitroglycerin for an additional 12 hours  IV hydration for an additional 12 hours   Radiology/Studies: Dg Chest 2 View: 07/29/2015    CLINICAL DATA:  Chest pain and pressure.  EXAM: CHEST  2 VIEW  COMPARISON:  None.  FINDINGS: The heart size and mediastinal contours are within normal limits. Both lungs are clear. The visualized skeletal structures are unremarkable.  IMPRESSION: Normal chest.   Electronically Signed   By: Francene Boyers M.D.   On: 07/29/2015 12:33    Current Medications:  . aspirin  81 mg Oral Daily  . clopidogrel  75 mg Oral Q breakfast  . rosuvastatin  10 mg Oral q1800  . sodium chloride  3 mL Intravenous Q12H   . heparin 1,400 Units/hr (07/30/15 0356)    ASSESSMENT AND PLAN:  1. NSTEMI (non-ST elevated myocardial infarction) - presented to [facility] ED on 07/29/2015 for chest pain. Troponin elevated to 7.88 and EKG showed Q-waves in the inferior leads. - Cardiac catheterization showed recent acute inferolateral infarction with angiography demonstrating  total occlusion of the RCA beyond the origin of the PDA. The left ventricular branch was supplied by collaterals from the left atrial recurrent branch of the distal circumflex. A 75-80% mid to distal circumflex stenosis was also noted.  - Dual antiplatelet therapy was recommended and the patient will eventually need PCI on the mid circumflex and second obtuse marginal branch. PCI on the circumflex can be done during this hospital stay or preferably in 7-10 days as needed elective staged procedure. - continue ASA, Plavix, and Statin. - No BB at this time due to bradycardia.  2. HLD - continue statin  Signed, Ellsworth Lennox , PA-C 7:32 AM 07/30/2015 Pager: [phone]   I have seen, examined and evaluated the patient this AM along with Ms. Iran Ouch, PA.  After reviewing all the available data and chart,  I agree with her findings, examination as well as impression recommendations.  S/p NSTEMI by EKG, but pathophysiologically Inferolateral MI with rRPAV-PL system 100% occluded (suspect that the presence of collaterals led to no STE on EKG).     Mild discomfort o/n - NTG weaned to off.   On IV Heparin - would plan for 48 hr coverage (through tomorrow PM).  On ASA & Plavix - with the intention of PCI on Cx-OM lesion --> would plan for staged PCI as OP if no recurrence of Angina as meds started.  If he were to have angina (@ rest or with [facility]), we would proceed with PCI to Cx prior to d/c.  With baseline bradycardia - we are holding BB (need to see HR response to ambulation). BP is actually optimal -- will add low dose ACE-I for positive remodeling  On high dose statin  [facility] consulted --> would benefit from Phase 2 once PCI done (dietary education & exercise regimen).   Marykay Lex, M.D., M.S. Interventional Cardiologist   Pager # [phone]

## 2015-07-31 ENCOUNTER — Encounter (HOSPITAL_COMMUNITY)
Admission: EM | Disposition: A | Discharge status: Home / Self Care | Payer: Self-pay | Source: Home / Self Care | Attending: Cardiology

## 2015-07-31 DIAGNOSIS — E668 Other obesity: Secondary | ICD-10-CM | POA: Diagnosis present

## 2015-07-31 HISTORY — PX: CARDIAC CATHETERIZATION: SHX172

## 2015-07-31 LAB — CBC
HCT: 38.2 % — ABNORMAL LOW (ref 39.0–52.0)
HEMOGLOBIN: 12.4 g/dL — AB (ref 13.0–17.0)
MCH: 30 pg (ref 26.0–34.0)
MCHC: 32.5 g/dL (ref 30.0–36.0)
MCV: 92.3 fL (ref 78.0–100.0)
Platelets: 318 10*3/uL (ref 150–400)
RBC: 4.14 MIL/uL — AB (ref 4.22–5.81)
RDW: 13.3 % (ref 11.5–15.5)
WBC: 10.1 10*3/uL (ref 4.0–10.5)

## 2015-07-31 LAB — HEPARIN LEVEL (UNFRACTIONATED): HEPARIN UNFRACTIONATED: 0.52 [IU]/mL (ref 0.30–0.70)

## 2015-07-31 LAB — PLATELET INHIBITION P2Y12: PLATELET FUNCTION P2Y12: 165 [PRU] — AB (ref 194–418)

## 2015-07-31 SURGERY — CORONARY STENT INTERVENTION

## 2015-07-31 MED ORDER — SODIUM CHLORIDE 0.9 % IV SOLN: 250.0000 mL | INTRAVENOUS | Status: DC | PRN

## 2015-07-31 MED ORDER — SODIUM CHLORIDE 0.9 % IV SOLN
250.0000 mg | INTRAVENOUS | Status: DC | PRN
  Administered 2015-07-31: 1.75 mg/kg/h via INTRAVENOUS

## 2015-07-31 MED ORDER — MIDAZOLAM HCL 2 MG/2ML IJ SOLN
INTRAMUSCULAR | Status: AC
  Filled 2015-07-31: qty 4

## 2015-07-31 MED ORDER — SODIUM CHLORIDE 0.9 % IJ SOLN: 3.0000 mL | INTRAMUSCULAR | Status: DC | PRN

## 2015-07-31 MED ORDER — HEPARIN (PORCINE) IN NACL 2-0.9 UNIT/ML-% IJ SOLN
INTRAMUSCULAR | Status: AC
  Filled 2015-07-31: qty 1000

## 2015-07-31 MED ORDER — BIVALIRUDIN 250 MG IV SOLR
INTRAVENOUS | Status: AC
  Filled 2015-07-31: qty 250

## 2015-07-31 MED ORDER — SODIUM CHLORIDE 0.9 % WEIGHT BASED INFUSION: 1.0000 mL/kg/h | INTRAVENOUS | Status: DC

## 2015-07-31 MED ORDER — FENTANYL CITRATE (PF) 100 MCG/2ML IJ SOLN
INTRAMUSCULAR | Status: DC | PRN
  Administered 2015-07-31 (×2): 50 ug via INTRAVENOUS

## 2015-07-31 MED ORDER — BIVALIRUDIN BOLUS VIA INFUSION - CUPID
INTRAVENOUS | Status: DC | PRN
  Administered 2015-07-31: 91.35 mg via INTRAVENOUS

## 2015-07-31 MED ORDER — MIDAZOLAM HCL 2 MG/2ML IJ SOLN
INTRAMUSCULAR | Status: DC | PRN
  Administered 2015-07-31 (×2): 1 mg via INTRAVENOUS

## 2015-07-31 MED ORDER — BIVALIRUDIN BOLUS VIA INFUSION - CUPID: INTRAVENOUS | Status: DC | PRN

## 2015-07-31 MED ORDER — LIDOCAINE HCL (PF) 1 % IJ SOLN
INTRAMUSCULAR | Status: AC
  Filled 2015-07-31: qty 30

## 2015-07-31 MED ORDER — SODIUM CHLORIDE 0.9 % WEIGHT BASED INFUSION: 3.0000 mL/kg/h | INTRAVENOUS | Status: DC

## 2015-07-31 MED ORDER — VERAPAMIL HCL 2.5 MG/ML IV SOLN
INTRAVENOUS | Status: AC
  Filled 2015-07-31: qty 2

## 2015-07-31 MED ORDER — SODIUM CHLORIDE 0.9 % IJ SOLN: 3.0000 mL | Freq: Two times a day (BID) | INTRAMUSCULAR | Status: DC

## 2015-07-31 MED ORDER — VERAPAMIL HCL 2.5 MG/ML IV SOLN
INTRAVENOUS | Status: DC | PRN
  Administered 2015-07-31: 15:00:00 via INTRA_ARTERIAL

## 2015-07-31 MED ORDER — SODIUM CHLORIDE 0.9 % IV SOLN: INTRAVENOUS | Status: DC

## 2015-07-31 MED ORDER — FENTANYL CITRATE (PF) 100 MCG/2ML IJ SOLN
INTRAMUSCULAR | Status: AC
  Filled 2015-07-31: qty 4

## 2015-07-31 MED ORDER — NITROGLYCERIN 1 MG/10 ML FOR IR/CATH LAB
INTRA_ARTERIAL | Status: AC
  Filled 2015-07-31: qty 10

## 2015-07-31 MED ORDER — LIDOCAINE HCL (PF) 1 % IJ SOLN
INTRAMUSCULAR | Status: DC | PRN
  Administered 2015-07-31: 16:00:00

## 2015-07-31 SURGICAL SUPPLY — 19 items
BALLN EUPHORA RX 2.5X15 (BALLOONS) ×2
BALLN ~~LOC~~ EMERGE MR 2.75X20 (BALLOONS) ×2
BALLOON EUPHORA RX 2.5X15 (BALLOONS) ×1 IMPLANT
BALLOON ~~LOC~~ EMERGE MR 2.75X20 (BALLOONS) ×1 IMPLANT
CATH INFINITI JR4 5F (CATHETERS) ×2 IMPLANT
CATH VISTA GUIDE 6FR AL1 (CATHETERS) ×2 IMPLANT
CATH VISTA GUIDE 6FR JL3.5 (CATHETERS) ×2 IMPLANT
CATH VISTA GUIDE 6FR XB3 (CATHETERS) ×2 IMPLANT
CATH VISTA GUIDE 6FR XB3.5 (CATHETERS) ×2 IMPLANT
GLIDESHEATH SLEND SS 6F .021 (SHEATH) ×2 IMPLANT
KIT ENCORE 26 ADVANTAGE (KITS) ×2 IMPLANT
KIT HEART LEFT (KITS) ×2 IMPLANT
PACK CARDIAC CATHETERIZATION (CUSTOM PROCEDURE TRAY) ×2 IMPLANT
STENT RESOLUTE INTEG 2.25X30 (Permanent Stent) ×2 IMPLANT
TRANSDUCER W/STOPCOCK (MISCELLANEOUS) ×2 IMPLANT
TUBING CIL FLEX 10 FLL-RA (TUBING) ×2 IMPLANT
WIRE HI TORQ VERSACORE-J 145CM (WIRE) ×2 IMPLANT
WIRE INTUITION PROPEL ST 180CM (WIRE) ×2 IMPLANT
WIRE SAFE-T 1.5MM-J .035X260CM (WIRE) ×2 IMPLANT

## 2015-07-31 NOTE — H&P (View-Only) (Signed)
   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 Branch Patient Profile: 44 yo male w/ HLD and no known CAD history presented to [facility] ED for chest pain on 07/29/2015. Taken to cath lab which showed recent acute inferolateral infarction and total occlusion of the RCA beyond the origin of the PDA with the left ventricular branch supplied by collaterals from the left atrial recurrent branch of the distal circumflex. A 75-80% mid to distal circumflex stenosis also noted.   SUBJECTIVE:  Still having intermittent ~2/10 SSCP & noted DOE walking with [facility] today.   BP & HR stable - still mildly bradycardic o/n.  OBJECTIVE Filed Vitals:   07/30/15 1616 07/30/15 1957 07/31/15 0427 07/31/15 0801  BP: 134/60 137/59 121/61 127/55  Pulse: 53 61 48 65  Temp: 98 F (36.7 C) 99.6 F (37.6 C) 98.4 F (36.9 C) 98.8 F (37.1 C)  TempSrc: Oral Oral Oral Oral  Resp: 18 16 17 17  Height:      Weight:   268 lb 8.3 oz (121.8 kg)   SpO2: 99% 98% 95% 96%    Intake/Output Summary (Last 24 hours) at 07/31/15 0919 Last data filed at 07/31/15 0804  Gross per 24 hour  Intake 677.13 ml  Output    700 ml  Net -22.87 ml   Filed Weights   07/29/15 1147 07/30/15 0400 07/31/15 0427  Weight: 265 lb (120.203 kg) 266 lb 12.1 oz (121 kg) 268 lb 8.3 oz (121.8 kg)  Body mass index is 38.53 kg/(m^2).   PHYSICAL EXAM General: Well developed, well nourished, male in no acute distress. Head: Normocephalic, atraumatic.  Neck: Supple without bruits, JVD not elevated. Lungs:  Resp regular and unlabored, CTA without wheezing or rales. Heart: RRR, S1, S2, no S3, S4, or murmur; no rub. Abdomen: Soft, non-tender, non-distended with normoactive bowel sounds. No  hepatomegaly. No rebound/guarding. No obvious abdominal masses. Extremities: No clubbing, cyanosis, or edema. Distal pedal pulses are 2+ bilaterally. Right radial cath site without ecchymosis or hematoma. Mild tenderness at site. Neuro: Alert and oriented X 3. Moves all extremities spontaneously. Psych: Normal affect.   LABS: CBC:  Recent Labs  07/30/15 0315 07/31/15 0403  WBC 9.8 10.1  HGB 12.9* 12.4*  HCT 39.2 38.2*  MCV 92.5 92.3  PLT 325 318   INR:  Recent Labs  07/29/15 1200  INR 1.02   Basic Metabolic Panel:  Recent Labs  07/29/15 1216  NA 138  K 3.6  CL 105  CO2 26  GLUCOSE 116*  BUN 14  CREATININE 0.94  CALCIUM 9.2   Cardiac Enzymes:  Recent Labs  07/29/15 1216  TROPONINI 7.88*    TELE:   NSR with rate in 50's - 70's. Bradycardiac at times with rate into mid-40's. Cath: Films personally reviewed. 1. Ost 2nd Mrg to 2nd Mrg lesion, 85% stenosed; Prox Cx lesion, 75% stenosed. 2. Mid RCA to Dist RCA lesion, 40% stenosed.Post Atrio lesion, 100% stenosed. 3. Ost LAD to Prox LAD lesion, 30% stenosed.   Recent acute inferolateral infarction now greater than 18 hours old. Angiography demonstrates total occlusion of the RCA beyond the origin of the PDA. The left ventricular branch is supplied by collaterals from the left atrial recurrent branch of the distal circumflex.    75-80% mid to distal circumflex stenosis. High-grade complex stenosis in the second obtuse marginal branch which is small to moderate in size.  Widely patent LAD and a dominant branching ramus intermedius.  Overall normal LV function with EF 55-60%, and inferobasal hypokinesis.   Recommendations:  Dual antiplatelet therapy.  Will eventually need PCI on the mid circumflex and second obtuse marginal branch. Not undertaken today due to acute infarction in the right coronary territory. PCI on the circumflex can be done during this hospital stay or preferably in 7-10 days as needed  elective staged procedure.  High-dose statin therapy  Phase I cardiac rehabilitation in a.m.  IV nitroglycerin for an additional 12 hours  IV hydration for an additional 12 hours   Radiology/Studies: Dg Chest 2 View: 07/29/2015   CLINICAL DATA:  Chest pain and pressure.  EXAM: CHEST  2 VIEW  COMPARISON:  None.  FINDINGS: The heart size and mediastinal contours are within normal limits. Both lungs are clear. The visualized skeletal structures are unremarkable.  IMPRESSION: Normal chest.   Electronically Signed   By: James  Maxwell M.D.   On: 07/29/2015 12:33    Current Medications:  . aspirin  81 mg Oral Daily  . clopidogrel  75 mg Oral Q breakfast  . lisinopril  2.5 mg Oral Daily  . rosuvastatin  10 mg Oral q1800  . sodium chloride  3 mL Intravenous Q12H   . heparin 1,600 Units/hr (07/30/15 2100)    ASSESSMENT AND PLAN:  1. NSTEMI/CAD - apparent subacute occlusion of rPAV-PL with L-R collaterals as culprit = Med Rx.  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   With persistent SSCP, I think the best plan is to proceed with PCI of Cx +/- OM today with re-look @ RCA to reassess RPL system.  Will add on for today.  On ASA & Plavix  On High dose statin  No BB at this time due to bradycardia (HR in 40s overnight - suspect OSA). ACE-I started yesterday.-> will add Nitrate today  Phase 1 & 2 [facility] consult     2. HLD - continue statin  3. Moderate Obesity - Needs nutrition counseling; consider OP OSA evaluation.   [facility] consulted --> would benefit from Phase 2 once PCI done (dietary education & exercise regimen).   HARDING, DAVID W, M.D., M.S. Interventional Cardiologist   Pager # [phone]    

## 2015-07-31 NOTE — Progress Notes (Signed)
ANTICOAGULATION CONSULT NOTE - Follow Up Consult  Pharmacy Consult for heparin Indication: CAD post cath, NSTEMI  No Known Allergies  Patient Measurements: Height:  (177.8 cm) Weight: 268 lb 8.3 oz (121.8 kg) IBW/kg (Calculated) : 73 Heparin Dosing Weight: 99 kg  Vital Signs: Temp: 98.8 F (37.1 C) (09/28 0801) Temp Source: Oral (09/28 0801) BP: 127/55 mmHg (09/28 0801) Pulse Rate: 65 (09/28 0801)  Labs:  Recent Labs  07/29/15 1200  07/29/15 1216 07/30/15 0315 07/30/15 1055 07/30/15 2055 07/31/15 0403  HGB  --   < > 14.0 12.9*  --   --  12.4*  HCT  --   --  42.3 39.2  --   --  38.2*  PLT  --   --  382 325  --   --  318  APTT 29  --   --   --   --   --   --   LABPROT 13.6  --   --   --   --   --   --   INR 1.02  --   --   --   --   --   --   HEPARINUNFRC  --   --   --   --  0.25* 0.30 0.52  CREATININE  --   --  0.94  --   --   --   --   TROPONINI  --   --  7.88*  --   --   --   --   < > = values in this interval not displayed.  Estimated Creatinine Clearance: 133.9 mL/min (by C-G formula based on Cr of 0.94).   Medications:  Infusions:  . heparin 1,600 Units/hr (07/30/15 2100)    Assessment: 44 y/o male with CP/pressure, some SOB that has been on/off for the last 18 hours. Seen at [facility] with EKG consistent with NSTEMI and transferred to [facility] for cath.  Cath >> total occlusion of the RCA beyond the origin of the PDA, 75-85% stenosis of distal Cx, patent LAD, EF 55-60%. Plan is for discharge and to return for PCI on circumflex on 10/6 if no recurrent chest pain.  Heparin level is therapeutic at 0.52;  Hb down to 12.4, platelets lower but overall stable.   Goal of Therapy:  Heparin level 0.3-0.7 units/ml Monitor platelets by anticoagulation protocol: Yes   Plan:  - Continue heparin drip at 1600 units/hr - Daily heparin level and CBC - Monitor for s/sx of bleeding   .Pollyann Samples, PharmD, BCPS 07/31/2015, 8:14 AM Pager: [phone]

## 2015-07-31 NOTE — Progress Notes (Signed)
TR BAND REMOVAL  LOCATION:    right radial  DEFLATED PER PROTOCOL:    Yes.    TIME BAND OFF / DRESSING APPLIED:    20:15   SITE UPON ARRIVAL:    Level 0  SITE AFTER BAND REMOVAL:    Level 0  REVERSE ALLEN'S TEST:     positive  CIRCULATION SENSATION AND MOVEMENT:    Within Normal Limits   Yes.    COMMENTS:    

## 2015-07-31 NOTE — Progress Notes (Signed)
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 Wyline Mood Patient Profile: 44 yo male w/ HLD and no known CAD history presented to [facility] ED for chest pain on 07/29/2015. Taken to cath lab which showed recent acute inferolateral infarction and total occlusion of the RCA beyond the origin of the PDA with the left ventricular branch supplied by collaterals from the left atrial recurrent branch of the distal circumflex. A 75-80% mid to distal circumflex stenosis also noted.   SUBJECTIVE:  Still having intermittent ~2/10 SSCP & noted DOE walking with [facility] today.   BP & HR stable - still mildly bradycardic o/n.  OBJECTIVE Filed Vitals:   07/30/15 1616 07/30/15 1957 07/31/15 0427 07/31/15 0801  BP: 134/60 137/59 121/61 127/55  Pulse: 53 61 48 65  Temp: 98 F (36.7 C) 99.6 F (37.6 C) 98.4 F (36.9 C) 98.8 F (37.1 C)  TempSrc: Oral Oral Oral Oral  Resp: Height:      Weight:   268 lb 8.3 oz (121.8 kg)   SpO2: 99% 98% 95% 96%    Intake/Output Summary (Last 24 hours) at 07/31/15 0919 Last data filed at 07/31/15 0804  Gross per 24 hour  Intake 677.13 ml  Output    700 ml  Net -22.87 ml   Filed Weights   07/29/15 1147 07/30/15 0400 07/31/15 0427  Weight: 265 lb (120.203 kg) 266 lb 12.1 oz (121 kg) 268 lb 8.3 oz (121.8 kg)  Body mass index is 38.53 kg/(m^2).   PHYSICAL EXAM General: Well developed, well nourished, male in no acute distress. Head: Normocephalic, atraumatic.  Neck: Supple without bruits, JVD not elevated. Lungs:  Resp regular and unlabored, CTA without wheezing or rales. Heart: RRR, S1, S2, no S3, S4, or murmur; no rub. Abdomen: Soft, non-tender, non-distended with normoactive bowel sounds. No  hepatomegaly. No rebound/guarding. No obvious abdominal masses. Extremities: No clubbing, cyanosis, or edema. Distal pedal pulses are 2+ bilaterally. Right radial cath site without ecchymosis or hematoma. Mild tenderness at site. Neuro: Alert and oriented X 3. Moves all extremities spontaneously. Psych: Normal affect.   LABS: CBC:  Recent Labs  07/30/15 0315 07/31/15 0403  WBC 9.8 10.1  HGB 12.9* 12.4*  HCT 39.2 38.2*  MCV 92.5 92.3  PLT 325 318   INR:  Recent Labs  07/29/15 1200  INR 1.02   Basic Metabolic Panel:  Recent Labs  16/10/96 1216  NA 138  K 3.6  CL 105  CO2 26  GLUCOSE 116*  BUN 14  CREATININE 0.94  CALCIUM 9.2   Cardiac Enzymes:  Recent Labs  07/29/15 1216  TROPONINI 7.88*    TELE:   NSR with rate in 50's - 70's. Bradycardiac at times with rate into mid-40's. Cath: Films personally reviewed. 1. Ost 2nd Mrg to 2nd Mrg lesion, 85% stenosed; Prox Cx lesion, 75% stenosed. 2. Mid RCA to Dist RCA lesion, 40% stenosed.Post Atrio lesion, 100% stenosed. 3. Ost LAD to Prox LAD lesion, 30% stenosed.   Recent acute inferolateral infarction now greater than 18 hours old. Angiography demonstrates total occlusion of the RCA beyond the origin of the PDA. The left ventricular branch is supplied by collaterals from the left atrial recurrent branch of the distal circumflex.  75-80% mid to distal circumflex stenosis. High-grade complex stenosis in the second obtuse marginal branch which is small to moderate in size.  Widely patent LAD and a dominant branching ramus intermedius.  Overall normal LV function with EF 55-60%, and inferobasal hypokinesis.   Recommendations:  Dual antiplatelet therapy.  Will eventually need PCI on the mid circumflex and second obtuse marginal branch. Not undertaken today due to acute infarction in the right coronary territory. PCI on the circumflex can be done during this hospital stay or preferably in 7-10 days as needed  elective staged procedure.  High-dose statin therapy  Phase I cardiac rehabilitation in a.m.  IV nitroglycerin for an additional 12 hours  IV hydration for an additional 12 hours   Radiology/Studies: Dg Chest 2 View: 07/29/2015   CLINICAL DATA:  Chest pain and pressure.  EXAM: CHEST  2 VIEW  COMPARISON:  None.  FINDINGS: The heart size and mediastinal contours are within normal limits. Both lungs are clear. The visualized skeletal structures are unremarkable.  IMPRESSION: Normal chest.   Electronically Signed   By: Francene Boyers M.D.   On: 07/29/2015 12:33    Current Medications:  . aspirin  81 mg Oral Daily  . clopidogrel  75 mg Oral Q breakfast  . lisinopril  2.5 mg Oral Daily  . rosuvastatin  10 mg Oral q1800  . sodium chloride  3 mL Intravenous Q12H   . heparin 1,600 Units/hr (07/30/15 2100)    ASSESSMENT AND PLAN:  1. NSTEMI/CAD - apparent subacute occlusion of rPAV-PL with L-R collaterals as culprit = Med Rx.  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   With persistent SSCP, I think the best plan is to proceed with PCI of Cx +/- OM today with re-look @ RCA to reassess RPL system.  Will add on for today.  On ASA & Plavix  On High dose statin  No BB at this time due to bradycardia (HR in 40s overnight - suspect OSA). ACE-I started yesterday.-> will add Nitrate today  Phase 1 & 2 [facility] consult     2. HLD - continue statin  3. Moderate Obesity - Needs nutrition counseling; consider OP OSA evaluation.   [facility] consulted --> would benefit from Phase 2 once PCI done (dietary education & exercise regimen).   Marykay Lex, M.D., M.S. Interventional Cardiologist   Pager # [phone]

## 2015-07-31 NOTE — Interval H&P Note (Signed)
Cath Lab Visit (complete for each Cath Lab visit)  Clinical Evaluation Leading to the Procedure:   ACS: Yes.    Non-ACS:    Anginal Classification: CCS IV  Anti-ischemic medical therapy: No Therapy  Non-Invasive Test Results: No non-invasive testing performed  Prior CABG: No previous CABG      History and Physical Interval Note:  07/31/2015 2:44 PM  Alex Barnes  has presented today for surgery, with the diagnosis of cad  The various methods of treatment have been discussed with the patient and family. After consideration of risks, benefits and other options for treatment, the patient has consented to  Procedure(s): Coronary Stent Intervention (N/A) as a surgical intervention .  The patient's history has been reviewed, patient examined, no change in status, stable for surgery.  I have reviewed the patient's chart and labs.  Questions were answered to the patient's satisfaction.     Lorine Bears

## 2015-07-31 NOTE — Progress Notes (Signed)
CARDIAC REHAB PHASE I   PRE:  Rate/Rhythm: 61 SR  BP:  Supine:   Sitting: 110/62  Standing:    SaO2:   MODE:  Ambulation: 800 ft   POST:  Rate/Rhythm: 83 SR  BP:  Supine:   Sitting: 126/67  Standing:    SaO2:  0850-0927 Pt walked 800 ft with steady gait. Chest pressure 2 on scale 1-10 before walk and did not increase with activity. MI education completed with pt except ex and Phase 2 as pt will have staged intervention if not done this admission.  Gave [place] Phase 2 brochure and discussed program for after PCI.  Discussed heart healthy diet and reviewed NTG use again. Will continue to follow.   Luetta Nutting, RN BSN  07/31/2015 9:23 AM

## 2015-08-01 ENCOUNTER — Encounter (HOSPITAL_COMMUNITY): Payer: Self-pay | Admitting: Cardiovascular Disease

## 2015-08-01 DIAGNOSIS — Z955 Presence of coronary angioplasty implant and graft: Secondary | ICD-10-CM | POA: Insufficient documentation

## 2015-08-01 LAB — CBC
HEMATOCRIT: 37.5 % — AB (ref 39.0–52.0)
HEMOGLOBIN: 12 g/dL — AB (ref 13.0–17.0)
MCH: 29.4 pg (ref 26.0–34.0)
MCHC: 32 g/dL (ref 30.0–36.0)
MCV: 91.9 fL (ref 78.0–100.0)
Platelets: 336 10*3/uL (ref 150–400)
RBC: 4.08 MIL/uL — AB (ref 4.22–5.81)
RDW: 13.4 % (ref 11.5–15.5)
WBC: 9.4 10*3/uL (ref 4.0–10.5)

## 2015-08-01 LAB — BASIC METABOLIC PANEL
ANION GAP: 7 (ref 5–15)
BUN: 8 mg/dL (ref 6–20)
CHLORIDE: 108 mmol/L (ref 101–111)
CO2: 25 mmol/L (ref 22–32)
Calcium: 8.9 mg/dL (ref 8.9–10.3)
Creatinine, Ser: 0.77 mg/dL (ref 0.61–1.24)
GFR calc Af Amer: >60 mL/min (ref >60)
Glucose, Bld: 117 mg/dL — ABNORMAL HIGH (ref 65–99)
POTASSIUM: 4 mmol/L (ref 3.5–5.1)
SODIUM: 140 mmol/L (ref 135–145)

## 2015-08-01 LAB — POCT ACTIVATED CLOTTING TIME: ACTIVATED CLOTTING TIME: 325 s

## 2015-08-01 MED ORDER — ASPIRIN 81 MG PO CHEW: 81.0000 mg | CHEWABLE_TABLET | Freq: Every day | ORAL | Status: AC

## 2015-08-01 MED ORDER — NITROGLYCERIN 0.4 MG SL SUBL: 0.4000 mg | SUBLINGUAL_TABLET | SUBLINGUAL | Status: DC | PRN

## 2015-08-01 MED ORDER — LISINOPRIL 2.5 MG PO TABS: 2.5000 mg | ORAL_TABLET | Freq: Every day | ORAL | Status: DC

## 2015-08-01 MED ORDER — CLOPIDOGREL BISULFATE 75 MG PO TABS: 75.0000 mg | ORAL_TABLET | Freq: Every day | ORAL | Status: DC

## 2015-08-01 NOTE — Progress Notes (Signed)
CARDIAC REHAB PHASE I   PRE:  Rate/Rhythm: 67 SR  BP:  Supine: 115/39  Sitting:   Standing:    SaO2:   MODE:  Ambulation: 1000 ft   POST:  Rate/Rhythm: 96 SR  BP:  Supine:   Sitting: 126/57  Standing:    SaO2: 0800-0835 Pt walked 1000 ft with steady gait. Tolerated well. Ed completed and reviewed NTG use at wife's request. Discussed CRP 2 and referring to [facility].   Luetta Nutting, RN BSN  08/01/2015 8:32 AM

## 2015-08-01 NOTE — Progress Notes (Signed)
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 Ready to walk the cardiac rehab  CURRENT MEDS . aspirin  81 mg Oral Daily  . clopidogrel  75 mg Oral Q breakfast  . lisinopril  2.5 mg Oral Daily  . rosuvastatin  10 mg Oral q1800  . sodium chloride  3 mL Intravenous Q12H  . sodium chloride  3 mL Intravenous Q12H    OBJECTIVE  Filed Vitals:   07/31/15 1605 07/31/15 1630 07/31/15 1946 08/01/15 0340  BP: 140/86 129/71 153/74 117/49  Pulse: 68 53 64 63  Temp:  99.3 F (37.4 C) 99.3 F (37.4 C) 98.4 F (36.9 C)  TempSrc:  Oral Oral Oral  Resp: Height:      Weight:    281 lb 1.4 oz (127.5 kg)  SpO2: 97% 99% 97% 100%    Intake/Output Summary (Last 24 hours) at 08/01/15 0742 Last data filed at 08/01/15 0200  Gross per 24 hour  Intake   1190 ml  Output   1600 ml  Net   -410 ml   Filed Weights   07/30/15 0400 07/31/15 0427 08/01/15 0340  Weight: 266 lb 12.1 oz (121 kg) 268 lb 8.3 oz (121.8 kg) 281 lb 1.4 oz (127.5 kg)    PHYSICAL EXAM  General: Pleasant, NAD. Neuro: Alert and oriented X 3. Moves all extremities spontaneously. Psych: Normal affect. HEENT:  Normal  Neck: Supple without bruits or JVD. Lungs:  Resp regular and unlabored, CTA. Heart: RRR no s3, s4, or murmurs. R radial cath site stable with good distal pulse Abdomen: Soft, non-tender, non-distended, BS + x 4.  Extremities: No clubbing, cyanosis or edema. DP/PT/Radials 2+ and equal bilaterally.  Accessory Clinical Findings  CBC  Recent Labs  07/31/15 0403 08/01/15 0233  WBC 10.1 9.4  HGB 12.4* 12.0*  HCT 38.2* 37.5*  MCV 92.3 91.9  PLT 318 336   Basic  Metabolic Panel  Recent Labs  07/29/15 1216 08/01/15 0233  NA 138 140  K 3.6 4.0  CL 105 108  CO2 26 25  GLUCOSE 116* 117*  BUN 14 8  CREATININE 0.94 0.77  CALCIUM 9.2 8.9   Cardiac Enzymes  Recent Labs  07/29/15 1216  TROPONINI 7.88*    TELE Sinus bradycardia    ECG  Sinus brady without significant ST-T wave changes  Radiology/Studies  Dg Chest 2 View  07/29/2015   CLINICAL DATA:  Chest pain and pressure.  EXAM: CHEST  2 VIEW  COMPARISON:  None.  FINDINGS: The heart size and mediastinal contours are within normal limits. Both lungs are clear. The visualized skeletal structures are unremarkable.  IMPRESSION: Normal chest.   Electronically Signed   By: Francene Boyers M.D.   On: 07/29/2015 12:33    ASSESSMENT AND PLAN  [name] is a 44 y.o.male with no known cardiac disease presents with chest pain presented with CP while carrying heavy sandbag. Presented to [facility], transferred to [facility].Trop 7.88. S/p Cath showed recent acute inferolateral infarction and total occlusion of the RCA beyond the origin of the PDA with the left ventricular branch supplied by collaterals from the left atrial recurrent branch of the  distal circumflex. A 75-80% mid to distal circumflex stenosis also noted  1. NSTEMI  - Troponin elevated to 7.88 and EKG showed Q-waves in the inferior leads  - cath 07/29/2015 acute inferolateral infarction >18 hrs, 100% total occlusion of post atrio branch of RCA, LV supplied by collaterals from the L atrial recurrent branch of distal LCx, 75-80% mid to distal LCx stenosis, high grade complex stenosis of OM2, patent LAD and RI, EF 55-60%  - cath 07/31/2015 continued occlusion of R postrior AV groove artery with L to R collateral, succesful angioplasty and DES (RESOLUTE INTEG 2.25X30) to mid LCx extending into OM2  - continue ASA, plavix, ACEI and statin. No BB due to baseline bradycardia  - stable for discharge  2. HLD  3. Moderate obesity  4. Likely OSA: snores  loudly and occasionally stop breathing during sleep per wife. Last sleep study 20 yrs ago, was told negative, will need another outpatient  Signed, Amedeo Plenty Pager: [phone]

## 2015-08-01 NOTE — Discharge Instructions (Signed)
No driving for 24 hours. No lifting over 5 lbs for 1 week. No sexual activity for 1 week. Keep procedure site clean & dry. If you notice increased pain, swelling, bleeding or pus, call/return!  You may shower, but no soaking baths/hot tubs/pools for 1 week.  ° ° °

## 2015-08-01 NOTE — Discharge Summary (Signed)
Discharge Summary   Patient ID: Alex Barnes,  MRN: [id number], DOB/AGE: 1970-12-12 44 y.o.  Admit date: 07/29/2015 Discharge date: 08/01/2015  Primary Care Provider: Donzetta Sprung Primary Cardiologist: Dr. 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 Ost 2nd Mrg to 2nd Mrg lesion, 85% stenosed. 2. Prox Cx lesion, 75% stenosed. 3. Post Atrio lesion, 100% stenosed. 4. Ost LAD to Prox LAD lesion, 30% stenosed. 5. Mid RCA to Dist RCA lesion, 40% stenosed.   Recent acute inferolateral infarction now greater than 18 hours old. Angiography demonstrates total occlusion of the RCA beyond the origin of the PDA. The left ventricular branch is supplied by collaterals from the left atrial recurrent branch of the distal circumflex.  75-80% mid to distal circumflex stenosis. High-grade complex stenosis in the second obtuse marginal branch which is small to moderate in size.  Widely patent LAD and a dominant branching ramus intermedius.  Overall normal LV function with EF 55-60%, and inferobasal hypokinesis.   Recommendations:   *Dual antiplatelet therapy.  Will eventually need PCI on the mid circumflex and second obtuse marginal branch. Not undertaken today due to acute infarction in the right coronary territory. PCI on the circumflex can be done during this hospital stay or preferably in 7-10 days as needed elective staged procedure.  High-dose statin therapy  Phase I cardiac rehabilitation in a.m.  IV nitroglycerin for an additional 12 hours  IV hydration for an additional 12 hours            Cardiac catheterization 07/31/2015 Conclusion    6. Post  Atrio lesion, 100% stenosed. 7. Ost LAD to Prox LAD lesion, 30% stenosed. 8. Mid RCA to Dist RCA lesion, 40% stenosed. 9. Ost 2nd Mrg to 2nd Mrg lesion, 90% stenosed. There is a 0% residual stenosis post intervention. 10. A drug-eluting stent was placed. 11. Prox Cx lesion, 75% stenosed. There is a 0% residual stenosis post intervention.  1. Continued occlusion of the right posterior AV groove artery with left to right collaterals. Severe mid left circumflex stenosis into OM 2. 2. Mildly elevated left ventricular end-diastolic pressure. 3. Successful angioplasty and drug-eluting stent placement to the mid left circumflex extending into OM 2. I used one long stent which covered both lesions.  Recommendations: Dual antiplatelets therapy for at least one year. Aggressive treatment of risk factors      Hospital Course  The patient is a pleasant 44 year old morbidly obese male with no significant past cardiac history who presented to the hospital on 07/29/2015 was chest pain after carrying heavy sandbags at his job. Initial EKG showed Q waves in the inferior lead, however no acute ST-T wave changes. He had a significant troponin elevation of 7.88. He was later transferred to [facility] for cardiac catheterization given NSTEMI.   Cardiac cath on the same day showed acute inferolateral infarction >18 hrs, 100% total occlusion of post atrio branch of RCA, LV supplied by collaterals from the L atrial recurrent branch of distal LCx, 75-80% mid to distal LCx stenosis, high grade complex stenosis of OM2, patent LAD and RI, EF 55-60%. It was originally felt the patient will have staged PCI to left circumflex and OM 2 in a few weeks  as long as he remained chest pain-free, however patient continued to have persistent chest discomfort. He was brought back to the cath lab on 07/31/2015 which showed continued occlusion of R postrior AV groove artery with L to R collateral, succesful angioplasty and DES  (RESOLUTE INTEG 2.25X30) to mid LCx extending into OM2. He was kept in the hospital overnight for observation.  He was seen in the morning of 9/29, at which time he denies any chest discomfort or shortness of breath. We were unable to add any beta blocker due to baseline bradycardia. Aspirin, Plavix, and ACE inhibitor was added along with his home statin medication. His blood pressure has been stable post cath. Per patient's wife, patient snores loudly and occasionally stops breathing during sleep. He reportedly had a negative sleep study 20 years ago, however he will likely need another one as outpatient because obstructive sleep apnea could have contributed to his bradycardia at night time. Otherwise, his right radial cath site appears to be stable without significant bleeding and with good distal pulses. He is deemed stable for discharge from cardiology perspective. I have emphasized on the importance of compliance with DAPT. I have also arranged a seven-day transition of care follow-up in our [place] office.    Discharge Vitals Blood pressure 115/39, pulse 62, temperature 98.1 F (36.7 C), temperature source Oral, resp. rate 16, height  (1.778 m), weight 281 lb 1.4 oz (127.5 kg), SpO2 93 %.  Filed Weights   07/30/15 0400 07/31/15 0427 08/01/15 0340  Weight: 266 lb 12.1 oz (121 kg) 268 lb 8.3 oz (121.8 kg) 281 lb 1.4 oz (127.5 kg)    Labs  CBC  Recent Labs  07/31/15 0403 08/01/15 0233  WBC 10.1 9.4  HGB 12.4* 12.0*  HCT 38.2* 37.5*  MCV 92.3 91.9  PLT 318 336   Basic Metabolic Panel  Recent Labs  07/29/15 1216 08/01/15 0233  NA 138 140  K 3.6 4.0  CL 105 108  CO2 26 25  GLUCOSE 116* 117*  BUN 14 8  CREATININE 0.94 0.77  CALCIUM 9.2 8.9   Cardiac Enzymes  Recent Labs  07/29/15 1216  TROPONINI 7.88*   Disposition  Pt is being discharged home today in good condition.  Follow-up Plans & Appointments      Follow-up Information    Follow up with Joni Reining, NP On 08/08/2015.   Specialties:  Nurse Practitioner, Radiology, Cardiology   Why:  2:10pm   Contact information:   [address] [place] [address] [phone]       Discharge Medications    Medication List    TAKE these medications        aspirin 81 MG chewable tablet  Chew 1 tablet (81 mg total) by mouth daily.     clopidogrel 75 MG tablet  Commonly known as:  PLAVIX  Take 1 tablet (75 mg total) by mouth daily with breakfast.     lisinopril 2.5 MG tablet  Commonly known as:  PRINIVIL,ZESTRIL  Take 1 tablet (2.5 mg total) by mouth daily.     nitroGLYCERIN 0.4 MG SL tablet  Commonly known as:  NITROSTAT  Place 1 tablet (0.4 mg total) under the tongue every 5 (five) minutes as needed for chest pain.     rosuvastatin 10 MG tablet  Commonly known as:  CRESTOR  Take 10 mg by mouth daily.        Duration of Discharge Encounter   Greater than 30 minutes including physician  time.  Ramond Dial PA-C Pager: [phone] 08/01/2015, 10:34 AM

## 2015-08-08 ENCOUNTER — Encounter: Payer: Self-pay | Admitting: Adult Health

## 2015-08-08 ENCOUNTER — Ambulatory Visit (INDEPENDENT_AMBULATORY_CARE_PROVIDER_SITE_OTHER): Payer: BLUE CROSS/BLUE SHIELD | Admitting: Adult Health

## 2015-08-08 ENCOUNTER — Ambulatory Visit (HOSPITAL_COMMUNITY)
Admission: RE | Admit: 2015-08-08 | Payer: BLUE CROSS/BLUE SHIELD | Source: Ambulatory Visit | Admitting: Interventional Cardiology

## 2015-08-08 VITALS — BP 110/64 | HR 75 | Ht 71.0 in | Wt 274.4 lb

## 2015-08-08 DIAGNOSIS — I10 Essential (primary) hypertension: Secondary | ICD-10-CM | POA: Diagnosis not present

## 2015-08-08 DIAGNOSIS — I252 Old myocardial infarction: Secondary | ICD-10-CM

## 2015-08-08 DIAGNOSIS — I2510 Atherosclerotic heart disease of native coronary artery without angina pectoris: Secondary | ICD-10-CM

## 2015-08-08 DIAGNOSIS — IMO0002 Reserved for concepts with insufficient information to code with codable children: Secondary | ICD-10-CM

## 2015-08-08 NOTE — Progress Notes (Deleted)
Name: Alex Barnes    DOB: 12/26/1970  Age: 44 y.o.  MR#: [id number]       PCP:  Donzetta Sprung, MD      Insurance: Payor: BLUE CROSS BLUE [employer]IELD / Plan: BCBS OTHER / Product Type: *No Product type* /   CC:    Chief Complaint  Patient presents with  . Coronary Artery Disease  . Hypertension    VS Filed Vitals:   08/08/15 1412  BP: 110/64  Pulse: 75  Height:  (1.803 m)  Weight: 274 lb 6.4 oz (124.467 kg)  SpO2: 98%    Weights Current Weight  08/08/15 274 lb 6.4 oz (124.467 kg)  08/01/15 281 lb 1.4 oz (127.5 kg)    Blood Pressure  BP Readings from Last 3 Encounters:  08/08/15 110/64  08/01/15 115/39     Admit date:  (Not on file) Last encounter with RMR:  Visit date not found   Allergy Review of patient's allergies indicates no known allergies.  Current Outpatient Prescriptions  Medication Sig Dispense Refill  . aspirin 81 MG chewable tablet Chew 1 tablet (81 mg total) by mouth daily.    . clopidogrel (PLAVIX) 75 MG tablet Take 1 tablet (75 mg total) by mouth daily with breakfast. 90 tablet 3  . lisinopril (PRINIVIL,ZESTRIL) 2.5 MG tablet Take 1 tablet (2.5 mg total) by mouth daily. 90 tablet 3  . nitroGLYCERIN (NITROSTAT) 0.4 MG SL tablet Place 1 tablet (0.4 mg total) under the tongue every 5 (five) minutes as needed for chest pain. 25 tablet 3  . rosuvastatin (CRESTOR) 10 MG tablet Take 10 mg by mouth daily.  6   No current facility-administered medications for this visit.    Discontinued Meds:   There are no discontinued medications.  Patient Active Problem List   Diagnosis Date Noted  . Stented coronary artery   . Moderate obesity 07/31/2015  . Hyperlipidemia 07/30/2015  . 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 TIA (transient ischemic attack) 07/30/2015  . NSTEMI (non-ST elevated myocardial infarction) (HCC) 07/29/2015    LABS    Component Value Date/Time   NA 140 08/01/2015 0233   NA 138 07/29/2015 1216   K 4.0  08/01/2015 0233   K 3.6 07/29/2015 1216   CL 108 08/01/2015 0233   CL 105 07/29/2015 1216   CO2 25 08/01/2015 0233   CO2 26 07/29/2015 1216   GLUCOSE 117* 08/01/2015 0233   GLUCOSE 116* 07/29/2015 1216   BUN 8 08/01/2015 0233   BUN 14 07/29/2015 1216   CREATININE 0.77 08/01/2015 0233   CREATININE 0.94 07/29/2015 1216   CALCIUM 8.9 08/01/2015 0233   CALCIUM 9.2 07/29/2015 1216   GFRNONAA >60 08/01/2015 0233   GFRNONAA >60 07/29/2015 1216   GFRAA >60 08/01/2015 0233   GFRAA >60 07/29/2015 1216   CMP     Component Value Date/Time   NA 140 08/01/2015 0233   K 4.0 08/01/2015 0233   CL 108 08/01/2015 0233   CO2 25 08/01/2015 0233   GLUCOSE 117* 08/01/2015 0233   BUN 8 08/01/2015 0233   CREATININE 0.77 08/01/2015 0233   CALCIUM 8.9 08/01/2015 0233   GFRNONAA >60 08/01/2015 0233   GFRAA >60 08/01/2015 0233       Component Value Date/Time   WBC 9.4 08/01/2015 0233   WBC 10.1 07/31/2015 0403   WBC 9.8 07/30/2015 0315   HGB 12.0* 08/01/2015 0233   HGB 12.4* 07/31/2015 0403   HGB 12.9* 07/30/2015  0315   HCT 37.5* 08/01/2015 0233   HCT 38.2* 07/31/2015 0403   HCT 39.2 07/30/2015 0315   MCV 91.9 08/01/2015 0233   MCV 92.3 07/31/2015 0403   MCV 92.5 07/30/2015 0315    Lipid Panel  No results found for: CHOL, TRIG, HDL, CHOLHDL, VLDL, LDLCALC, LDLDIRECT  ABG No results found for: PHART, PCO2ART, PO2ART, HCO3, TCO2, ACIDBASEDEF, O2SAT   No results found for: TSH BNP (last 3 results) No results for input(s): BNP in the last 8760 hours.  ProBNP (last 3 results) No results for input(s): PROBNP in the last 8760 hours.  Cardiac Panel (last 3 results) No results for input(s): CKTOTAL, CKMB, TROPONINI, RELINDX in the last 72 hours.  Iron/TIBC/Ferritin/ %Sat No results found for: IRON, TIBC, FERRITIN, IRONPCTSAT   EKG Orders placed or performed during the hospital encounter of 07/29/15  . EKG 12-Lead  . EKG 12-Lead  . EKG 12-Lead  . EKG 12-Lead  . EKG 12-Lead  . EKG  12-Lead  . EKG  . EKG 12-Lead  . EKG 12-Lead  . EKG 12-Lead  . EKG 12-Lead  . EKG 12-Lead  . EKG 12-Lead  . 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  EXAM: CHEST  2 VIEW  COMPARISON:  None.  FINDINGS: The heart size and mediastinal contours are within normal limits. Both lungs are clear. The visualized skeletal structures are unremarkable.  IMPRESSION: Normal chest.   Electronically Signed   By: Francene Boyers M.D.   On: 07/29/2015 12:33

## 2015-08-08 NOTE — Progress Notes (Signed)
Cardiology Office Note   Date:  08/08/2015   ID:  Alex Barnes, DOB 1971/02/28, MRN [id number]  PCP:  Donzetta Sprung, MD  Cardiologist: Arlington Calix, NP   Chief Complaint  Patient presents with  . Coronary Artery Disease  . Hypertension      History of Present Illness: Alex Barnes is a 44 y.o. male who presents for post-hospitalization followup after admission for non-ST elevation MI,with subsequent cardiac catheterization demonstrating total occlusion of the right coronary artery beyond the origin of the PDA.,75% to 80%, mid to distal circumflex stenosis.  High-grade complex stenosis in the second obtuse marginal branch, which is small to moderate in size, widely patent LAD with dominant branching ramus intermedius.  Normal LV systolic function. The patient ultimately received a drug-eluting stent to the mid circumflex extending into OM 2.  This is a long stent covering multiple lesions.  He was started on dual antiplatelet therapy, but beta blocker was not added due to baseline bradycardia.  Plavix, aspirin, and ACE inhibitor were started.  It was also noted that the patient likely has obstructive sleep apnea, he did have a negative sleep study 20 years ago, however, it is recommended that this be repeated.  He is here for TOC followup.  He comes today with some complaints that do not appear to be cardiac related.  He is having some burning sensation in his upper left thigh, he noticed it while he was in the hospital and it has not gone away since discharge.  He also has multiple questions concerning his heart disease.  He is having some lightheadedness and his blood pressure is on the low side.  He is medically compliant.  He is interested in going to cardiac rehabilitation.  He is been walking 15 minutes at a time twice a day.   Past Medical History  Diagnosis Date  . Hypercholesteremia   . NSTEMI (non-ST elevated myocardial infarction) (HCC) 07/28/2015  . Headache    "weekly" (07/29/2015)  . CAD (coronary artery disease)     cath 9/26 & 07/31/2015 100% total occlusion of post atrio branch of RCA, LV supplied by collaterals from the L atrial recurrent branch of distal LCx, 75-80% mid to distal LCx stenosis, high grade complex stenosis of OM2, patent LAD and RI, EF 55-60%. On 9/8, single DES to midLCx extending into OM2.    Past Surgical History  Procedure Laterality Date  . Wisdom tooth extraction  1993  . Tonsillectomy  ~ 1989  . Cardiac catheterization  2006; 07/29/2015  . Cardiac catheterization N/A 07/29/2015    Procedure: Left Heart Cath and Coronary Angiography;  Surgeon: Lyn Records, MD;  Location: [facility] INVASIVE CV LAB;  Service: Cardiovascular;  Laterality: N/A;  . Cardiac catheterization N/A 07/31/2015    Procedure: Coronary Stent Intervention;  Surgeon: Iran Ouch, MD;  Location: [facility] INVASIVE CV LAB;  Service: Cardiovascular;  Laterality: N/A;  . Cardiac catheterization N/A 07/31/2015    Procedure: Left Heart Cath and Coronary Angiography;  Surgeon: Iran Ouch, MD;  Location: [facility] INVASIVE CV LAB;  Service: Cardiovascular;  Laterality: N/A;     Current Outpatient Prescriptions  Medication Sig Dispense Refill  . aspirin 81 MG chewable tablet Chew 1 tablet (81 mg total) by mouth daily.    . clopidogrel (PLAVIX) 75 MG tablet Take 1 tablet (75 mg total) by mouth daily with breakfast. 90 tablet 3  . lisinopril (PRINIVIL,ZESTRIL) 2.5 MG tablet Take 1 tablet (2.5 mg total) by mouth daily. 90  tablet 3  . nitroGLYCERIN (NITROSTAT) 0.4 MG SL tablet Place 1 tablet (0.4 mg total) under the tongue every 5 (five) minutes as needed for chest pain. 25 tablet 3  . rosuvastatin (CRESTOR) 10 MG tablet Take 10 mg by mouth daily.  6   No current facility-administered medications for this visit.    Allergies:   Review of patient's allergies indicates no known allergies.    Social History:  The patient  reports that he quit smoking about 18 years ago. His  smoking use included Cigarettes. He has a 5 pack-year smoking history. His smokeless tobacco use includes Snuff. He reports that he drinks about 3.6 oz of alcohol per week. He reports that he does not use illicit drugs.   Family History:  The patient's family history is not on file.    ROS: All other systems are reviewed and negative. Unless otherwise mentioned in H&P    PHYSICAL EXAM: VS:  BP 110/64 mmHg  Pulse 75  Ht 5\' 11"  (1.803 m)  Wt 274 lb 6.4 oz (124.467 kg)  BMI 38.29 kg/m2  SpO2 98% , BMI Body mass index is 38.29 kg/(m^2). GEN: Well nourished, well developed, in no acute distress HEENT: normal Neck: no JVD, carotid bruits, or masses Cardiac:RRR; no murmurs, rubs, or gallops,no edema  Respiratory:  clear to auscultation bilaterally, normal work of breathing GI: soft, nontender, nondistended, + BS MS: no deformity or atrophy Skin: warm and dry, no rash Neuro:  Strength and sensation are intact Psych: euthymic mood, full affect   Recent Labs: 08/01/2015: BUN 8; Creatinine, Ser 0.77; Hemoglobin 12.0*; Platelets 336; Potassium 4.0; Sodium 140    Lipid Panel No results found for: CHOL, TRIG, HDL, CHOLHDL, VLDL, LDLCALC, LDLDIRECT    Wt Readings from Last 3 Encounters:  08/08/15 274 lb 6.4 oz (124.467 kg)  08/01/15 281 lb 1.4 oz (127.5 kg)    ASSESSMENT AND PLAN:  1.  WUJ:[id number] post non-ST elevation MI.  Cardiac catheterization revealing ostial LAD the proximal LAD 30%, mid RCA.  The distal RCA, 40% stenosis, ostial second marginal to second marginal lesion 90% stenosis.  Drug-eluting stent placed.  Proximal circumflex 75% stenosis was 0% residual stenosis post intervention. He is given information on website [employer]org for further information on heart disease and treatment.  He continues to have some non-descriptive chest pain.  He is uncertain of its gas or soreness.    He has taken nitroglycerin on occasion since being discharged, which has alleviated the pain.   I am reluctant to put him on nitrates at this time as his blood pressure is low normal at 110/64.  He has not been placed on a beta blocker due to bradycardia during hospitalization with heart rates as low as 40 beats per minute, which could potentially assistant, angina.    2. Hypertension: He does not have a history of hypertension, and has been placed on low-dose ACE inhibitor, post MI.Marland Kitchen  Blood pressures are running a little low at home, and is found to be low here in the office.  The patient is having symptoms of lightheadedness and dizziness.  I checkedorthostatics here in the office.  He is not significantly orthostatic.  Was only a 10 point drop from lying to standing.  Heart rate was well controlled. The patient will remain on his current medication list will see him again in 3 months.  If he needs a work excuse to return to work.  We will provide this for him.  3. Hypercholesterolemia:  He is on Crestor.  I do not think that the burning sensation in the upper left thigh is related to myalgia symptoms as it is localized.  He may have had some sort of compression of his lumbar spine during lengthy bed rest.  If this continues or worsens, I would suggest that he followup with his primary care physician to have further examination of his lumbar spine.  At this time.  I will continue his statin medicine. Current medicines are reviewed at length with the patient today.    4. Bradycardia: this was noted during hospitalization.  He is not on a beta blocker.  Uncertain if his dizziness and lightheadedness is related to heart rate.  Can consider an outpatient monitor or evaluate further with TSH.   In have spend 35 minutes with this patient and his wife answering multiple questions.  Orders Placed This Encounter  Procedures  . AMB referral to cardiac rehabilitation     Disposition:   FU with 3 months.   Signed, Joni Reining, NP  08/08/2015 3:45 PM    [facility] [address] [place], [place] [address] Phone: [phone]; Fax: [phone] and

## 2015-08-08 NOTE — Patient Instructions (Signed)
Your physician recommends that you schedule a follow-up appointment in: 3 months with MD    Your physician recommends that you continue on your current medications as directed. Please refer to the Current Medication list given to you today.    You have been referred to cardiac rehab at [facility]       Thank you for choosing [facility] !

## 2015-08-23 ENCOUNTER — Encounter (HOSPITAL_COMMUNITY): Payer: Self-pay | Admitting: *Deleted

## 2015-08-23 ENCOUNTER — Emergency Department (HOSPITAL_COMMUNITY): Payer: BLUE CROSS/BLUE SHIELD

## 2015-08-23 ENCOUNTER — Observation Stay (HOSPITAL_COMMUNITY)
Admission: EM | Admit: 2015-08-23 | Discharge: 2015-08-24 | Disposition: A | Discharge status: Home / Self Care | Payer: BLUE CROSS/BLUE SHIELD | Attending: Internal Medicine | Admitting: Internal Medicine

## 2015-08-23 DIAGNOSIS — Z955 Presence of coronary angioplasty implant and graft: Secondary | ICD-10-CM

## 2015-08-23 DIAGNOSIS — E668 Other obesity: Secondary | ICD-10-CM | POA: Insufficient documentation

## 2015-08-23 DIAGNOSIS — I209 Angina pectoris, unspecified: Secondary | ICD-10-CM | POA: Diagnosis present

## 2015-08-23 DIAGNOSIS — R079 Chest pain, unspecified: Secondary | ICD-10-CM | POA: Diagnosis not present

## 2015-08-23 DIAGNOSIS — E785 Hyperlipidemia, unspecified: Secondary | ICD-10-CM | POA: Diagnosis not present

## 2015-08-23 DIAGNOSIS — R001 Bradycardia, unspecified: Secondary | ICD-10-CM | POA: Diagnosis present

## 2015-08-23 DIAGNOSIS — I25110 Atherosclerotic heart disease of native coronary artery with unstable angina pectoris: Principal | ICD-10-CM | POA: Diagnosis present

## 2015-08-23 DIAGNOSIS — E669 Obesity, unspecified: Secondary | ICD-10-CM | POA: Diagnosis present

## 2015-08-23 LAB — CBC
HEMATOCRIT: 38 % — AB (ref 39.0–52.0)
HEMATOCRIT: 38.1 % — AB (ref 39.0–52.0)
HEMOGLOBIN: 12.6 g/dL — AB (ref 13.0–17.0)
HEMOGLOBIN: 12.7 g/dL — AB (ref 13.0–17.0)
MCH: 30.2 pg (ref 26.0–34.0)
MCH: 30 pg (ref 26.0–34.0)
MCHC: 33.2 g/dL (ref 30.0–36.0)
MCHC: 33.3 g/dL (ref 30.0–36.0)
MCV: 91.1 fL (ref 78.0–100.0)
MCV: 89.9 fL (ref 78.0–100.0)
Platelets: 328 10*3/uL (ref 150–400)
Platelets: 316 10*3/uL (ref 150–400)
RBC: 4.17 MIL/uL — ABNORMAL LOW (ref 4.22–5.81)
RBC: 4.24 MIL/uL (ref 4.22–5.81)
RDW: 12.9 % (ref 11.5–15.5)
RDW: 13 % (ref 11.5–15.5)
WBC: 8.8 10*3/uL (ref 4.0–10.5)
WBC: 9.3 10*3/uL (ref 4.0–10.5)

## 2015-08-23 LAB — BASIC METABOLIC PANEL
Anion gap: 7 (ref 5–15)
BUN: 14 mg/dL (ref 6–20)
CALCIUM: 9.4 mg/dL (ref 8.9–10.3)
CHLORIDE: 106 mmol/L (ref 101–111)
CO2: 26 mmol/L (ref 22–32)
CREATININE: 0.97 mg/dL (ref 0.61–1.24)
GFR calc non Af Amer: >60 mL/min (ref >60)
Glucose, Bld: 98 mg/dL (ref 65–99)
Potassium: 3.9 mmol/L (ref 3.5–5.1)
Sodium: 139 mmol/L (ref 135–145)

## 2015-08-23 LAB — TROPONIN I
Troponin I: <0.03 ng/mL (ref <0.031)
Troponin I: <0.03 ng/mL (ref <0.031)

## 2015-08-23 MED ORDER — LISINOPRIL 2.5 MG PO TABS
2.5000 mg | ORAL_TABLET | Freq: Every day | ORAL | Status: DC
  Administered 2015-08-24: 2.5 mg via ORAL
  Filled 2015-08-23: qty 1

## 2015-08-23 MED ORDER — ROSUVASTATIN CALCIUM 10 MG PO TABS
20.0000 mg | ORAL_TABLET | Freq: Every day | ORAL | Status: DC
  Administered 2015-08-24: 20 mg via ORAL
  Filled 2015-08-23: qty 2

## 2015-08-23 MED ORDER — SODIUM CHLORIDE 0.9 % IV SOLN: INTRAVENOUS | Status: DC

## 2015-08-23 MED ORDER — ROSUVASTATIN CALCIUM 10 MG PO TABS: 10.0000 mg | ORAL_TABLET | Freq: Every day | ORAL | Status: DC

## 2015-08-23 MED ORDER — OXYCODONE-ACETAMINOPHEN 5-325 MG PO TABS
1.0000 | ORAL_TABLET | Freq: Once | ORAL | Status: AC
  Administered 2015-08-23: 1 via ORAL
  Filled 2015-08-23: qty 1

## 2015-08-23 MED ORDER — CLOPIDOGREL BISULFATE 75 MG PO TABS
75.0000 mg | ORAL_TABLET | Freq: Every day | ORAL | Status: DC
  Administered 2015-08-24: 75 mg via ORAL
  Filled 2015-08-23: qty 1

## 2015-08-23 MED ORDER — ONDANSETRON HCL 4 MG PO TABS: 4.0000 mg | ORAL_TABLET | Freq: Four times a day (QID) | ORAL | Status: DC | PRN

## 2015-08-23 MED ORDER — MORPHINE SULFATE (PF) 2 MG/ML IV SOLN
2.0000 mg | INTRAVENOUS | Status: DC | PRN
  Administered 2015-08-23: 2 mg via INTRAVENOUS
  Filled 2015-08-23: qty 1

## 2015-08-23 MED ORDER — COLCHICINE 0.6 MG PO TABS
0.6000 mg | ORAL_TABLET | Freq: Two times a day (BID) | ORAL | Status: DC
  Administered 2015-08-23 – 2015-08-24 (×3): 0.6 mg via ORAL
  Filled 2015-08-23 (×3): qty 1

## 2015-08-23 MED ORDER — ASPIRIN EC 325 MG PO TBEC
650.0000 mg | DELAYED_RELEASE_TABLET | Freq: Three times a day (TID) | ORAL | Status: DC
  Administered 2015-08-24 (×2): 650 mg via ORAL
  Filled 2015-08-23 (×3): qty 2

## 2015-08-23 MED ORDER — ASPIRIN 81 MG PO CHEW
324.0000 mg | CHEWABLE_TABLET | Freq: Once | ORAL | Status: AC
  Administered 2015-08-23: 324 mg via ORAL
  Filled 2015-08-23: qty 4

## 2015-08-23 MED ORDER — ASPIRIN 81 MG PO CHEW: 81.0000 mg | CHEWABLE_TABLET | Freq: Every day | ORAL | Status: DC

## 2015-08-23 MED ORDER — SODIUM CHLORIDE 0.9 % IJ SOLN
3.0000 mL | Freq: Two times a day (BID) | INTRAMUSCULAR | Status: DC
  Administered 2015-08-23 – 2015-08-24 (×2): 3 mL via INTRAVENOUS

## 2015-08-23 MED ORDER — ONDANSETRON HCL 4 MG/2ML IJ SOLN: 4.0000 mg | Freq: Four times a day (QID) | INTRAMUSCULAR | Status: DC | PRN

## 2015-08-23 MED ORDER — ENOXAPARIN SODIUM 60 MG/0.6ML ~~LOC~~ SOLN
60.0000 mg | SUBCUTANEOUS | Status: DC
  Administered 2015-08-24: 60 mg via SUBCUTANEOUS
  Filled 2015-08-23 (×2): qty 0.6

## 2015-08-23 NOTE — ED Notes (Signed)
Pt states pain to left chest began last night and went away this morning and returned at 1300. States, "It's a sharp pain every time my heart beats." Pt took 2 nitro without relief.

## 2015-08-23 NOTE — ED Provider Notes (Signed)
CSN: [id number]     Arrival date & time 08/23/15  1728 History   First MD Initiated Contact with Patient 08/23/15 1817     Chief Complaint  Patient presents with  . Chest Pain      HPI Pt was seen at 1830. Per pt, c/o gradual onset and persistence of constant left sided chest "pain" that began last night. Pt states he was able to go to sleep, but woke up at 0800 today with the same discomfort. Pt states the discomfort has been present throughout the day today, worse since 1300. Describes the CP as "throbbing" and "pulsing." Pt took his own SL ntg without change. Pt has not taken any other meds to treat his symptoms. Denies any associated symptoms. Denies palpitations, no SOB/cough, no abd pain, no N/V/D, no back pain.     Past Medical History  Diagnosis Date  . Hypercholesteremia   . NSTEMI (non-ST elevated myocardial infarction) (HCC) 07/28/2015  . Headache     "weekly" (07/29/2015)  . CAD (coronary artery disease)     cath 9/26 & 07/31/2015 100% total occlusion of post atrio branch of RCA, LV supplied by collaterals from the L atrial recurrent branch of distal LCx, 75-80% mid to distal LCx stenosis, high grade complex stenosis of OM2, patent LAD and RI, EF 55-60%. On 9/8, single DES to midLCx extending into OM2.   Past Surgical History  Procedure Laterality Date  . Wisdom tooth extraction  1993  . Tonsillectomy  ~ 1989  . Cardiac catheterization  2006; 07/29/2015  . Cardiac catheterization N/A 07/29/2015    Procedure: Left Heart Cath and Coronary Angiography;  Surgeon: Lyn Records, MD;  Location: [facility] INVASIVE CV LAB;  Service: Cardiovascular;  Laterality: N/A;  . Cardiac catheterization N/A 07/31/2015    Procedure: Coronary Stent Intervention;  Surgeon: Iran Ouch, MD;  Location: [facility] INVASIVE CV LAB;  Service: Cardiovascular;  Laterality: N/A;  . Cardiac catheterization N/A 07/31/2015    Procedure: Left Heart Cath and Coronary Angiography;  Surgeon: Iran Ouch, MD;  Location:  [facility] INVASIVE CV LAB;  Service: Cardiovascular;  Laterality: N/A;    Social History  Substance Use Topics  . Smoking status: Former Smoker -- 0.50 packs/day for 10 years    Types: Cigarettes    Quit date: 11/07/1996  . Smokeless tobacco: Former Neurosurgeon     Comment: "quit smoking cigarettes in 1998"  . Alcohol Use: Yes    Review of Systems ROS: Statement: All systems negative except as marked or noted in the HPI; Constitutional: Negative for fever and chills. ; ; Eyes: Negative for eye pain, redness and discharge. ; ; ENMT: Negative for ear pain, hoarseness, nasal congestion, sinus pressure and sore throat. ; ; Cardiovascular: +CP. Negative for palpitations, diaphoresis, dyspnea and peripheral edema. ; ; Respiratory: Negative for cough, wheezing and stridor. ; ; Gastrointestinal: Negative for nausea, vomiting, diarrhea, abdominal pain, blood in stool, hematemesis, jaundice and rectal bleeding. . ; ; Genitourinary: Negative for dysuria, flank pain and hematuria. ; ; Musculoskeletal: Negative for back pain and neck pain. Negative for swelling and trauma.; ; Skin: Negative for pruritus, rash, abrasions, blisters, bruising and skin lesion.; ; Neuro: Negative for headache, lightheadedness and neck stiffness. Negative for weakness, altered level of consciousness , altered mental status, extremity weakness, paresthesias, involuntary movement, seizure and syncope.      Allergies  Review of patient's allergies indicates no known allergies.  Home Medications   Prior to Admission medications   Medication  Sig Start Date End Date Taking? Authorizing Provider  aspirin 81 MG chewable tablet Chew 1 tablet (81 mg total) by mouth daily. 08/01/15   Azalee Course, PA  clopidogrel (PLAVIX) 75 MG tablet Take 1 tablet (75 mg total) by mouth daily with breakfast. 08/01/15   Azalee Course, PA  lisinopril (PRINIVIL,ZESTRIL) 2.5 MG tablet Take 1 tablet (2.5 mg total) by mouth daily. 08/01/15   Azalee Course, PA  nitroGLYCERIN (NITROSTAT)  0.4 MG SL tablet Place 1 tablet (0.4 mg total) under the tongue every 5 (five) minutes as needed for chest pain. 08/01/15   Azalee Course, PA  rosuvastatin (CRESTOR) 10 MG tablet Take 10 mg by mouth daily. 04/29/15   Historical Provider, MD   BP 111/64 mmHg  Pulse 44  Temp(Src) 98.2 F (36.8 C) (Oral)  Resp 9  Ht  (1.803 m)  Wt 260 lb (117.935 kg)  BMI 36.28 kg/m2  SpO2 100% Physical Exam  1835: Physical examination:  Nursing notes reviewed; Vital signs and O2 SAT reviewed;  Constitutional: Well developed, Well nourished, Well hydrated, In no acute distress; Head:  Normocephalic, atraumatic; Eyes: EOMI, PERRL, No scleral icterus; ENMT: Mouth and pharynx normal, Mucous membranes moist; Neck: Supple, Full range of motion, No lymphadenopathy; Cardiovascular: Bradycardic rate and rhythm, No gallop; Respiratory: Breath sounds clear & equal bilaterally, No wheezes.  Speaking full sentences with ease, Normal respiratory effort/excursion; Chest: Nontender, Movement normal; Abdomen: Soft, Nontender, Nondistended, Normal bowel sounds; Genitourinary: No CVA tenderness; Extremities: Pulses normal, No tenderness, No edema, No calf edema or asymmetry.; Neuro: AA&Ox3, Major CN grossly intact.  Speech clear. No gross focal motor or sensory deficits in extremities.; Skin: Color normal, Warm, Dry.   ED Course  Procedures (including critical care time) Labs Review   Imaging Review  I have personally reviewed and evaluated these images and lab results as part of my medical decision-making.   EKG Interpretation   Date/Time:  Friday August 23 2015 19:39:34 EDT  Repeat EKG Ventricular Rate:  41 PR Interval:  191 QRS Duration: 106 QT Interval:  521 QTC Calculation: 430 R Axis:   31 Text Interpretation:  Sinus bradycardia Inferoposterior infarct, age  indeterminate T wave abnormality Lateral leads T wave abnormality Lateral  leads is now Present Since last tracing of earlier today Confirmed by  [facility]   MD, Nicholos Johns [phone]) on 08/23/2015 8:10:19 PM      MDM  MDM Reviewed: previous chart, nursing note and vitals Reviewed previous: ECG and labs Interpretation: labs, ECG and x-ray     ED ECG REPORT   Date: 08/23/2015  Rate: 47  Rhythm: sinus bradycardia  On arrival  QRS Axis: normal  Intervals: normal  ST/T Wave abnormalities: normal  Conduction Disutrbances:none  Narrative Interpretation: inferior infarct, age undetermined  Old EKG Reviewed: unchanged; no significant changes compared to EKG dated 07/31/2015.   Results for orders placed or performed during the hospital encounter of 08/23/15  CBC  Result Value Ref Range   WBC 8.8 4.0 - 10.5 K/uL   RBC 4.17 (L) 4.22 - 5.81 MIL/uL   Hemoglobin 12.6 (L) 13.0 - 17.0 g/dL   HCT 60.4 (L) 54.0 - 98.1 %   MCV 91.1 78.0 - 100.0 fL   MCH 30.2 26.0 - 34.0 pg   MCHC 33.2 30.0 - 36.0 g/dL   RDW 19.1 47.8 - 29.5 %   Platelets 328 150 - 400 K/uL  Basic metabolic panel  Result Value Ref Range   Sodium 139 135 - 145 mmol/L  Potassium 3.9 3.5 - 5.1 mmol/L   Chloride 106 101 - 111 mmol/L   CO2 26 22 - 32 mmol/L   Glucose, Bld 98 65 - 99 mg/dL   BUN 14 6 - 20 mg/dL   Creatinine, Ser 4.090.97 0.61 - 1.24 mg/dL   Calcium 9.4 8.9 - 81.110.3 mg/dL   GFR calc non Af Amer >60 >60 mL/min   GFR calc Af Amer >60 >60 mL/min   Anion gap 7 5 - 15  Troponin I  Result Value Ref Range   Troponin I <0.03 <0.031 ng/mL   Dg Chest 2 View 08/23/2015  CLINICAL DATA:  Left-sided chest pain overnight EXAM: CHEST - 2 VIEW COMPARISON:  07/29/2015 FINDINGS: The heart size and mediastinal contours are within normal limits. Both lungs are clear. The visualized skeletal structures are unremarkable. IMPRESSION: No active disease. Electronically Signed   By: Alcide CleverMark  Lukens M.D.   On: 08/23/2015 18:22     2040:   ASA given. Pt states he "feels better" after pain meds. Initial EKG without change from previous EKG. 2nd EKG with new TWI lateral leads. 2nd troponin pending.  Will observation admit. Dx and testing d/w pt and family.  Questions answered.  Verb understanding, agreeable to admit. [facility] Virgina OrganQureshi, case discussed, including:  HPI, pertinent PM/SHx, VS/PE, dx testing, ED course and treatment:  States [facility].  [facility] Sharl MaLama, case discussed, including:  HPI, pertinent PM/SHx, VS/PE, dx testing, ED course and treatment:  Agreeable to admit, requests to write temporary orders, obtain observation tele bed to [facility]/Dr. Lovell SheehanJenkins.    Samuel JesterKathleen Pershing Skidmore, DO 08/27/15 [phone]

## 2015-08-23 NOTE — ED Notes (Signed)
Report attempted. Receiving RN to call back. 

## 2015-08-23 NOTE — Consult Note (Signed)
Alex Barnes CONSULTATION  Patient ID: Alex Barnes MRN: [id number], DOB/AGE: [birth year]   Admit date: 08/23/2015   Primary Physician: Gar Ponto, MD Primary Cardiologist: Dr. Cloria Spring NP Ordering Physician: Francine Graven MD  Reason for consultation: chest pain  HPI: This is a 44 y.o. male with known history of CAD and risk factors (hypertension, HLD), recent NSTEMI s/p PCI who presented with chest pain to [facility] hospital ED. Patient was in his usual state of health when he started having chest pain yesterday. He slept with it and it was still present in the morning. Over the day, it started getting worse until afternoon when he came to the ED. The chest pain is sharp in character, present in the precordial region, does not radiate, 8/10 in intensity, gets worse with deep breathing and is not associated with food, exertion. It improved with morphine and did not change with NTG. He denied SOB, diaphoresis, nausea, fevers, chills, night sweats, PND, orthopnea, weight gain with it. Patient said that this pain was not similar to the chest pain when he had his MI 3 weeks ago. He will be starting cardiac rehabilitation in a few days. He recently saw Nurse Jory Sims and was stable at that time.  Of note, patient is not very active at baseline and lives with his wife. In the ED, patient had stable hemodynamics. His EKG showed T wave inversions in the lateral V5-V6 leads which was worse than prior EKGs. His first troponin was negative. He was transferred to [facility] as cardiologist is not available [facility] over the weekend.  At [facility], he was received stable and continue to have constant chest pain. He had stable hemodynamics on arrival.   07/29/2015 and 07/31/2015 LHC w/PCI and staged PCI total occlusion of the right coronary artery beyond the origin of the PDA.,75% to 80%, mid to distal circumflex stenosis. High-grade complex stenosis in the second obtuse  marginal branch, which is small to moderate in size, widely patent LAD with dominant branching ramus intermedius. PTCA to Om2 and prox LCx with a 2.25/30 resolute. RPLV filled up by collaterals.   Problem List: Past Medical History  Diagnosis Date  . Hypercholesteremia   . NSTEMI (non-ST elevated myocardial infarction) ([place]) 07/28/2015  . Headache     "weekly" (07/29/2015)  . CAD (coronary artery disease)     cath 9/26 & 07/31/2015 100% total occlusion of post atrio branch of RCA, LV supplied by collaterals from the L atrial recurrent branch of distal LCx, 75-80% mid to distal LCx stenosis, high grade complex stenosis of OM2, patent LAD and RI, EF 55-60%. On 9/8, single DES to midLCx extending into OM2.    Past Surgical History  Procedure Laterality Date  . Wisdom tooth extraction  1993  . Tonsillectomy  ~ 1989  . Cardiac catheterization  2006; 07/29/2015  . Cardiac catheterization N/A 07/29/2015    Procedure: Left Heart Cath and Coronary Angiography;  Surgeon: Belva Crome, MD;  Location: [place] CV LAB;  Service: Cardiovascular;  Laterality: N/A;  . Cardiac catheterization N/A 07/31/2015    Procedure: Coronary Stent Intervention;  Surgeon: Wellington Hampshire, MD;  Location: [place] CV LAB;  Service: Cardiovascular;  Laterality: N/A;  . Cardiac catheterization N/A 07/31/2015    Procedure: Left Heart Cath and Coronary Angiography;  Surgeon: Wellington Hampshire, MD;  Location: [place] CV LAB;  Service: Cardiovascular;  Laterality: N/A;     Allergies: No Known Allergies   Home Medications  No current facility-administered medications for this encounter.   Current Outpatient Prescriptions  Medication Sig Dispense Refill  . aspirin 81 MG chewable tablet Chew 1 tablet (81 mg total) by mouth daily.    . clopidogrel (PLAVIX) 75 MG tablet Take 1 tablet (75 mg total) by mouth daily with breakfast. 90 tablet 3  . lisinopril (PRINIVIL,ZESTRIL) 2.5 MG tablet Take 1 tablet (2.5 mg total) by  mouth daily. 90 tablet 3  . nitroGLYCERIN (NITROSTAT) 0.4 MG SL tablet Place 1 tablet (0.4 mg total) under the tongue every 5 (five) minutes as needed for chest pain. 25 tablet 3  . rosuvastatin (CRESTOR) 10 MG tablet Take 10 mg by mouth daily.  6     No family history on file.   Social History   Social History  . Marital Status: Married    Spouse Name: N/A  . Number of Children: N/A  . Years of Education: N/A   Occupational History  . Not on file.   Social History Main Topics  . Smoking status: Former Smoker -- 0.50 packs/day for 10 years    Types: Cigarettes    Quit date: 11/07/1996  . Smokeless tobacco: Former Systems developer     Comment: "quit smoking cigarettes in 1998"  . Alcohol Use: Yes  . Drug Use: No  . Sexual Activity: Yes   Other Topics Concern  . Not on file   Social History Narrative     Review of Systems: General: negative for chills, fever, night sweats or weight changes.  Cardiovascular: chest pain negative for dyspnea on exertion, edema, orthopnea, palpitations, paroxysmal nocturnal dyspnea and dyspnea Dermatological: negative for rash Respiratory: negative for cough or wheezing Urologic: negative for hematuria Abdominal: negative for nausea, vomiting, diarrhea, bright red blood per rectum, melena, or hematemesis Neurologic: negative for visual changes, syncope, or dizziness Endocrine: no diabetes, no hypothyroidism Immunological: no lymph adenopathy Psych: non homicidal/suicidal  Physical Exam: Vitals: BP 118/61 mmHg  Pulse 42  Temp(Src) 98.2 F (36.8 C) (Oral)  Resp 12  Ht '5\' 11"'  (1.803 m)  Wt 117.935 kg (260 lb)  BMI 36.28 kg/m2  SpO2 100% General: not in acute distress Neck: JVP flat, neck supple Heart: regular rate and rhythm, S1, S2, no murmurs  Lungs: CTAB  GI: non tender, non distended, bowel sounds present Extremities: no edema Neuro: AAO x 3  Psych: normal affect, no anxiety   Labs:   Results for orders placed or performed during  the hospital encounter of 08/23/15 (from the past 24 hour(s))  CBC     Status: Abnormal   Collection Time: 08/23/15  5:55 PM  Result Value Ref Range   WBC 8.8 4.0 - 10.5 K/uL   RBC 4.17 (L) 4.22 - 5.81 MIL/uL   Hemoglobin 12.6 (L) 13.0 - 17.0 g/dL   HCT 38.0 (L) 39.0 - 52.0 %   MCV 91.1 78.0 - 100.0 fL   MCH 30.2 26.0 - 34.0 pg   MCHC 33.2 30.0 - 36.0 g/dL   RDW 12.9 11.5 - 15.5 %   Platelets 328 150 - 400 K/uL  Basic metabolic panel     Status: None   Collection Time: 08/23/15  5:55 PM  Result Value Ref Range   Sodium 139 135 - 145 mmol/L   Potassium 3.9 3.5 - 5.1 mmol/L   Chloride 106 101 - 111 mmol/L   CO2 26 22 - 32 mmol/L   Glucose, Bld 98 65 - 99 mg/dL   BUN 14 6 - 20 mg/dL  Creatinine, Ser 0.97 0.61 - 1.24 mg/dL   Calcium 9.4 8.9 - 10.3 mg/dL   GFR calc non Af Amer >60 >60 mL/min   GFR calc Af Amer >60 >60 mL/min   Anion gap 7 5 - 15  Troponin I     Status: None   Collection Time: 08/23/15  5:55 PM  Result Value Ref Range   Troponin I <0.03 <0.031 ng/mL     Radiology/Studies: Dg Chest 2 View  08/23/2015  CLINICAL DATA:  Left-sided chest pain overnight EXAM: CHEST - 2 VIEW COMPARISON:  07/29/2015 FINDINGS: The heart size and mediastinal contours are within normal limits. Both lungs are clear. The visualized skeletal structures are unremarkable. IMPRESSION: No active disease. Electronically Signed   By: Inez Catalina M.D.   On: 08/23/2015 18:22   Dg Chest 2 View  07/29/2015  CLINICAL DATA:  Chest pain and pressure. EXAM: CHEST  2 VIEW COMPARISON:  None. FINDINGS: The heart size and mediastinal contours are within normal limits. Both lungs are clear. The visualized skeletal structures are unremarkable. IMPRESSION: Normal chest. Electronically Signed   By: Lorriane Shire M.D.   On: 07/29/2015 12:33    EKG: T wave inversions in the V5-V6, sinus bradycardia  Echo: not available  Cardiac cath: 07/31/2015 1. Occlusion of the right posterior AV groove artery with left to  right collaterals. Severe mid left circumflex stenosis into OM 2. 2. Mildly elevated left ventricular end-diastolic pressure. 3. Successful angioplasty and drug-eluting stent placement to the mid left circumflex extending into OM 2. I used one long stent which covered both lesions.   Post Atrio lesion, 100% stenosed.  Ost LAD to Prox LAD lesion, 30% stenosed.  Mid RCA to Dist RCA lesion, 40% stenosed.  Ost 2nd Mrg to 2nd Mrg lesion, 90% stenosed. There is a 0% residual stenosis post intervention.  A drug-eluting stent was placed.  Prox Cx lesion, 75% stenosed. There is a 0% residual stenosis post intervention.  Medical decision making:  Discussed care with the patient Discussed care with the physician on the phone Reviewed labs and imaging personally Reviewed prior records  ASSESSMENT AND PLAN:  This is a 44 y.o. male with recent NSTEMI and risk factors presented with chest pain 3 weeks post MI with negative troponin with non specific EKG changes from [facility] ED to [place].    Principal Problem:   Coronary artery disease with unstable angina pectoris ([place]) Active Problems:   Hyperlipidemia   Moderate obesity   Stented coronary artery   Bradycardia   Chest pain   Chest pain, gets worse with respiration/position - probably due to post MI pericarditis - will check CRP, ESR, cycle trop x 2 and ordered echocardiogram for LVEF/wall motion - started on high dose aspirin 650 mg TID w/meals (2-3 weeks and then taper) and colchicine (for 3 months), outpatient follow up with Dr. Harvest Forest in 2-3 weeks  Chronic coronary artery disease s/p recent PCI Cont aspirin, clopidogrel. Increase dose of rosuvastatin (high dose) to 20 mg daily  Asymptomatic bradycardia HR 40s - 50s.  NSR, no blocks Outpatient evaluation for sleep apnea  Signed, Flossie Dibble, MD MS 08/23/2015, 11:31 PM

## 2015-08-23 NOTE — H&P (Signed)
PCP:   Donzetta Sprung, MD   Chief Complaint:  Chest pain  HPI:  44 year old male who  has a past medical history of Hypercholesteremia; NSTEMI (non-ST elevated myocardial infarction) (HCC) (07/28/2015); Headache; and CAD (coronary artery disease). Patient underwent cardiac catheterization last month which showed 75-80% mid to distal circumflex stenosis along with placement of drug-eluting stent to the mid circumflex, he was placed on dual antiplatelet therapy with aspirin and Plavix. Today patient presents to the hospital with left-sided chest pain throbbing in character, Which started last night and became worse today. Patient says he took nitroglycerin without any change. Denies shortness of breath. No nausea vomiting or diarrhea. No fever dysuria urgency. No cough. In the ED first EKG did not show significant abnormality, repeat EKG showed T-wave inversions which were new in leads V5 and V6.  Allergies:  No Known Allergies    Past Medical History  Diagnosis Date  . Hypercholesteremia   . NSTEMI (non-ST elevated myocardial infarction) (HCC) 07/28/2015  . Headache     "weekly" (07/29/2015)  . CAD (coronary artery disease)     cath 9/26 & 07/31/2015 100% total occlusion of post atrio branch of RCA, LV supplied by collaterals from the L atrial recurrent branch of distal LCx, 75-80% mid to distal LCx stenosis, high grade complex stenosis of OM2, patent LAD and RI, EF 55-60%. On 9/8, single DES to midLCx extending into OM2.    Past Surgical History  Procedure Laterality Date  . Wisdom tooth extraction  1993  . Tonsillectomy  ~ 1989  . Cardiac catheterization  2006; 07/29/2015  . Cardiac catheterization N/A 07/29/2015    Procedure: Left Heart Cath and Coronary Angiography;  Surgeon: Lyn Records, MD;  Location: [facility] INVASIVE CV LAB;  Service: Cardiovascular;  Laterality: N/A;  . Cardiac catheterization N/A 07/31/2015    Procedure: Coronary Stent Intervention;  Surgeon: Iran Ouch, MD;   Location: [facility] INVASIVE CV LAB;  Service: Cardiovascular;  Laterality: N/A;  . Cardiac catheterization N/A 07/31/2015    Procedure: Left Heart Cath and Coronary Angiography;  Surgeon: Iran Ouch, MD;  Location: [facility] INVASIVE CV LAB;  Service: Cardiovascular;  Laterality: N/A;    Prior to Admission medications   Medication Sig Start Date End Date Taking? Authorizing Provider  aspirin 81 MG chewable tablet Chew 1 tablet (81 mg total) by mouth daily. 08/01/15  Yes Azalee Course, PA  clopidogrel (PLAVIX) 75 MG tablet Take 1 tablet (75 mg total) by mouth daily with breakfast. 08/01/15  Yes Azalee Course, PA  lisinopril (PRINIVIL,ZESTRIL) 2.5 MG tablet Take 1 tablet (2.5 mg total) by mouth daily. 08/01/15  Yes Azalee Course, PA  nitroGLYCERIN (NITROSTAT) 0.4 MG SL tablet Place 1 tablet (0.4 mg total) under the tongue every 5 (five) minutes as needed for chest pain. 08/01/15  Yes Azalee Course, PA  rosuvastatin (CRESTOR) 10 MG tablet Take 10 mg by mouth daily. 04/29/15  Yes Historical Provider, MD    Social History:  reports that he quit smoking about 18 years ago. His smoking use included Cigarettes. He has a 5 pack-year smoking history. He has quit using smokeless tobacco. He reports that he drinks alcohol. He reports that he does not use illicit drugs.  No family history on file.  Filed Weights   08/23/15 1741  Weight: 117.935 kg (260 lb)    All the positives are listed in BOLD  Review of Systems:  HEENT: Headache, blurred vision, runny nose, sore throat Neck: Hypothyroidism, hyperthyroidism,,lymphadenopathy Chest :  Shortness of breath, history of COPD, Asthma Heart : Chest pain, history of coronary arterey disease GI:  Nausea, vomiting, diarrhea, constipation, GERD GU: Dysuria, urgency, frequency of urination, hematuria Neuro: Stroke, seizures, syncope Psych: Depression, anxiety, hallucinations   Physical Exam: Blood pressure 118/61, pulse 42, temperature 98.2 F (36.8 C), temperature source Oral, resp.  rate 12, height 5\' 11"  (1.803 m), weight 117.935 kg (260 lb), SpO2 100 %. Constitutional:   Patient is a well-developed and well-nourished male* in no acute distress and cooperative with exam. Head: Normocephalic and atraumatic Mouth: Mucus membranes moist Eyes: PERRL, EOMI, conjunctivae normal Neck: Supple, No Thyromegaly Cardiovascular: RRR, S1 normal, S2 normal Pulmonary/Chest: CTAB, no wheezes, rales, or rhonchi Abdominal: Soft. Non-tender, non-distended, bowel sounds are normal, no masses, organomegaly, or guarding present.  Neurological: A&O x3, Strength is normal and symmetric bilaterally, cranial nerve II-XII are grossly intact, no focal motor deficit, sensory intact to light touch bilaterally.  Extremities : No Cyanosis, Clubbing or Edema  Labs on Admission:  Basic Metabolic Panel:  Recent Labs Lab 08/23/15 1755  NA 139  K 3.9  CL 106  CO2 26  GLUCOSE 98  BUN 14  CREATININE 0.97  CALCIUM 9.4   Liver Function Tests: No results for input(s): AST, ALT, ALKPHOS, BILITOT, PROT, ALBUMIN in the last 168 hours. No results for input(s): LIPASE, AMYLASE in the last 168 hours. No results for input(s): AMMONIA in the last 168 hours. CBC:  Recent Labs Lab 08/23/15 1755  WBC 8.8  HGB 12.6*  HCT 38.0*  MCV 91.1  PLT 328   Cardiac Enzymes:  Recent Labs Lab 08/23/15 1755  TROPONINI <0.03     Radiological Exams on Admission: Dg Chest 2 View  08/23/2015  CLINICAL DATA:  Left-sided chest pain overnight EXAM: CHEST - 2 VIEW COMPARISON:  07/29/2015 FINDINGS: The heart size and mediastinal contours are within normal limits. Both lungs are clear. The visualized skeletal structures are unremarkable. IMPRESSION: No active disease. Electronically Signed   By: Alcide CleverMark  Lukens M.D.   On: 08/23/2015 18:22    EKG: Independently reviewed. Sinus bradycardia, T wave inversion in lateral leads V5, V6   Assessment/Plan Principal Problem:   Coronary artery disease with unstable angina  pectoris (HCC) Active Problems:   Hyperlipidemia   Moderate obesity   Stented coronary artery   Bradycardia   Chest pain  Chest pain Patient presenting with left-sided chest pain throbbing in character, with new changes in EKG with flipped T waves in V5 and V6. Cardiology fellow was called by the ED physician. [facility] to be evaluated by cardiology. First set of cardiac enzymes is negative. Will obtain serial cardiac enzymes.  Coronary artery disease status post stenting in mid circumflex Continue aspirin and Plavix dual antiplatelet therapy. Lisinopril, Crestor. Patient is not on beta blockers due to underlying bradycardia  Bradycardia Chronic, will be monitored on telemetry  DVT prophylaxis Lovenox   [facility], Dr. Della GooHarvette Jenkins has accepted the patient.  Code status: Full code  Family discussion: Admission, patients condition and plan of care including tests being ordered have been discussed with the patient and *his wife at bedside* who indicate understanding and agree with the plan and Code Status.   Time Spent on Admission: 60 min  Lachandra Dettmann S [facility] Pager: [phone] 08/23/2015, 8:57 PM  If 7PM-7AM, please contact night-coverage  [email or web address]  Password [email or web address]

## 2015-08-24 ENCOUNTER — Observation Stay (HOSPITAL_BASED_OUTPATIENT_CLINIC_OR_DEPARTMENT_OTHER): Payer: BLUE CROSS/BLUE SHIELD

## 2015-08-24 DIAGNOSIS — E668 Other obesity: Secondary | ICD-10-CM | POA: Diagnosis not present

## 2015-08-24 DIAGNOSIS — I25110 Atherosclerotic heart disease of native coronary artery with unstable angina pectoris: Secondary | ICD-10-CM | POA: Diagnosis not present

## 2015-08-24 DIAGNOSIS — I309 Acute pericarditis, unspecified: Secondary | ICD-10-CM

## 2015-08-24 DIAGNOSIS — Z955 Presence of coronary angioplasty implant and graft: Secondary | ICD-10-CM

## 2015-08-24 DIAGNOSIS — R079 Chest pain, unspecified: Secondary | ICD-10-CM

## 2015-08-24 DIAGNOSIS — R001 Bradycardia, unspecified: Secondary | ICD-10-CM | POA: Diagnosis not present

## 2015-08-24 DIAGNOSIS — E785 Hyperlipidemia, unspecified: Secondary | ICD-10-CM | POA: Diagnosis not present

## 2015-08-24 LAB — CBC
HEMATOCRIT: 39.2 % (ref 39.0–52.0)
HEMOGLOBIN: 12.8 g/dL — AB (ref 13.0–17.0)
MCH: 29.6 pg (ref 26.0–34.0)
MCHC: 32.7 g/dL (ref 30.0–36.0)
MCV: 90.5 fL (ref 78.0–100.0)
Platelets: 301 10*3/uL (ref 150–400)
RBC: 4.33 MIL/uL (ref 4.22–5.81)
RDW: 13.1 % (ref 11.5–15.5)
WBC: 7 10*3/uL (ref 4.0–10.5)

## 2015-08-24 LAB — COMPREHENSIVE METABOLIC PANEL
ALBUMIN: 3.4 g/dL — AB (ref 3.5–5.0)
ALK PHOS: 38 U/L (ref 38–126)
ALT: 27 U/L (ref 17–63)
ANION GAP: 11 (ref 5–15)
AST: 20 U/L (ref 15–41)
BILIRUBIN TOTAL: 0.4 mg/dL (ref 0.3–1.2)
BUN: 12 mg/dL (ref 6–20)
CALCIUM: 9.4 mg/dL (ref 8.9–10.3)
CO2: 23 mmol/L (ref 22–32)
Chloride: 106 mmol/L (ref 101–111)
Creatinine, Ser: 0.96 mg/dL (ref 0.61–1.24)
GFR calc Af Amer: >60 mL/min (ref >60)
GLUCOSE: 96 mg/dL (ref 65–99)
POTASSIUM: 4 mmol/L (ref 3.5–5.1)
Sodium: 140 mmol/L (ref 135–145)
TOTAL PROTEIN: 6.9 g/dL (ref 6.5–8.1)

## 2015-08-24 LAB — SEDIMENTATION RATE: Sed Rate: 25 mm/hr — ABNORMAL HIGH (ref 0–16)

## 2015-08-24 LAB — C-REACTIVE PROTEIN

## 2015-08-24 LAB — TROPONIN I: Troponin I: <0.03 ng/mL (ref <0.031)

## 2015-08-24 LAB — CREATININE, SERUM
Creatinine, Ser: 0.95 mg/dL (ref 0.61–1.24)
GFR calc Af Amer: >60 mL/min (ref >60)
GFR calc non Af Amer: >60 mL/min (ref >60)

## 2015-08-24 MED ORDER — OXYCODONE-ACETAMINOPHEN 5-325 MG PO TABS: 1.0000 | ORAL_TABLET | Freq: Four times a day (QID) | ORAL | Status: DC | PRN

## 2015-08-24 MED ORDER — IBUPROFEN 400 MG PO TABS: 400.0000 mg | ORAL_TABLET | Freq: Three times a day (TID) | ORAL | Status: DC

## 2015-08-24 MED ORDER — IBUPROFEN 200 MG PO TABS
400.0000 mg | ORAL_TABLET | Freq: Three times a day (TID) | ORAL | Status: DC
  Administered 2015-08-24: 400 mg via ORAL
  Filled 2015-08-24: qty 2

## 2015-08-24 MED ORDER — COLCHICINE 0.6 MG PO TABS: 0.6000 mg | ORAL_TABLET | Freq: Two times a day (BID) | ORAL | Status: DC

## 2015-08-24 MED ORDER — ASPIRIN EC 81 MG PO TBEC: 81.0000 mg | DELAYED_RELEASE_TABLET | Freq: Every day | ORAL | Status: DC

## 2015-08-24 NOTE — Progress Notes (Signed)
Pt discharge teaching and instructions reviewed with pt. VSS. Pt discharging home with wife

## 2015-08-24 NOTE — Discharge Summary (Signed)
Physician Discharge Summary  Alex Barnes [id number] DOB: [birth year] DOA: 08/23/2015  PCP: Donzetta SprungANIEL, TERRY, MD  Admit date: 08/23/2015 Discharge date: 08/24/2015  Time spent: < 30 minutes  Recommendations for Outpatient Follow-up:  1. Follow up with PCP in 2 weeks 2. Follow up with cardiology office in  in 1-2 weeks   Discharge Diagnoses:  Principal Problem:   Coronary artery disease with unstable angina pectoris [facility](HCC) Active Problems:   Hyperlipidemia   Moderate obesity   Stented coronary artery   Bradycardia   Chest pain  Discharge Condition: stable  Diet recommendation: heart healthy  Filed Weights   08/23/15 1741 08/23/15 2303  Weight: 117.935 kg (260 lb) 122.517 kg (270 lb 1.6 oz)   History of present illness:  44 year old male who  has a past medical history of Hypercholesteremia; NSTEMI (non-ST elevated myocardial infarction) (HCC) (07/28/2015); Headache; and CAD (coronary artery disease). Patient underwent cardiac catheterization last month which showed 75-80% mid to distal circumflex stenosis along with placement of drug-eluting stent to the mid circumflex, he was placed on dual antiplatelet therapy with aspirin and Plavix. Today patient presents to the hospital with left-sided chest pain throbbing in character, Which started last night and became worse today. Patient says he took nitroglycerin without any change. Denies shortness of breath. No nausea vomiting or diarrhea. No fever dysuria urgency. No cough. In the ED first EKG did not show significant abnormality, repeat EKG showed T-wave inversions which were new in leads V5 and V6.  Hospital Course:  [facility] for cardiology consult given recent NSTEMI and for cardiology consult. His chest pain slowly improved after admission with pain medications. His cardiac enzymes were cycled and have remained negative. He underwent a 2D echo which showed normal EF and no  WMA. Cardiology was consulted and have followed patient while hospitalized. His chest pain was different in quality and location from his prior MI chest pain, and this is likely due to possible pericarditis. He was started on Motrin and colchicine and was discharged home in stable condition with outpatient cardiology follow up, already has an appointment in 5 days.  Procedures:  2D echo  Study Conclusions - Left ventricle: The cavity size was normal. Wall thickness wasnormal. Systolic function was normal. The estimated ejectionfraction was in the range of 60% to 65%. Wall motion was normal;there were no regional wall motion abnormalities. - Left atrium: The atrium was mildly dilated. - Right atrium: The atrium was mildly dilated.  Consultations:  Cardiology   Discharge Exam: Filed Vitals:   08/23/15 2133 08/23/15 2303 08/24/15 0500 08/24/15 1411  BP: 129/63 128/51 125/56 124/58  Pulse: 44 47 43 47  Temp: 98.4 F (36.9 C) 98.3 F (36.8 C) 98.3 F (36.8 C) 97.3 F (36.3 C)  TempSrc:  Oral Oral Oral  Resp: 14 16 16 18   Height:  5\' 11"  (1.803 m)    Weight:  122.517 kg (270 lb 1.6 oz)    SpO2: 99% 98% 99% 99%    General: NAD Cardiovascular: RRR Respiratory: CTA biL  Discharge Instructions     Medication List    TAKE these medications        aspirin 81 MG chewable tablet  Chew 1 tablet (81 mg total) by mouth daily.     clopidogrel 75 MG tablet  Commonly known as:  PLAVIX  Take 1 tablet (75 mg total) by mouth daily with breakfast.     colchicine 0.6 MG tablet  Take 1 tablet (0.6 mg total) by mouth 2 (two) times daily.     ibuprofen 400 MG tablet  Commonly known as:  ADVIL,MOTRIN  Take 1 tablet (400 mg total) by mouth 3 (three) times daily.     lisinopril 2.5 MG tablet  Commonly known as:  PRINIVIL,ZESTRIL  Take 1 tablet (2.5 mg total) by mouth daily.     nitroGLYCERIN 0.4 MG SL tablet  Commonly known as:  NITROSTAT  Place 1 tablet (0.4 mg total) under the  tongue every 5 (five) minutes as needed for chest pain.     oxyCODONE-acetaminophen 5-325 MG tablet  Commonly known as:  ROXICET  Take 1-2 tablets by mouth every 6 (six) hours as needed for severe pain.     rosuvastatin 10 MG tablet  Commonly known as:  CRESTOR  Take 10 mg by mouth daily.           Follow-up Information    Follow up with Dina Rich, MD In 3 months.   Specialty:  Cardiology   Why:  office will contact you   Contact information:   [address] [place] [address] [phone]       Follow up with Donzetta Sprung, MD. Schedule an appointment as soon as possible for a visit in 2 weeks.   Specialty:  Family Medicine   Contact information:   [address] [place] [address] [phone]       The results of significant diagnostics from this hospitalization (including imaging, microbiology, ancillary and laboratory) are listed below for reference.    Significant Diagnostic Studies: Dg Chest 2 View  08/23/2015  CLINICAL DATA:  Left-sided chest pain overnight EXAM: CHEST - 2 VIEW COMPARISON:  07/29/2015 FINDINGS: The heart size and mediastinal contours are within normal limits. Both lungs are clear. The visualized skeletal structures are unremarkable. IMPRESSION: No active disease. Electronically Signed   By: Alcide Clever M.D.   On: 08/23/2015 18:22   Dg Chest 2 View  07/29/2015  CLINICAL DATA:  Chest pain and pressure. EXAM: CHEST  2 VIEW COMPARISON:  None. FINDINGS: The heart size and mediastinal contours are within normal limits. Both lungs are clear. The visualized skeletal structures are unremarkable. IMPRESSION: Normal chest. Electronically Signed   By: Francene Boyers M.D.   On: 07/29/2015 12:33   Labs: Basic Metabolic Panel:  Recent Labs Lab 08/23/15 1755 08/23/15 2324 08/24/15 0513  NA 139  --  140  K 3.9  --  4.0  CL 106  --  106  CO2 26  --  23  GLUCOSE 98  --  96  BUN 14  --  12  CREATININE 0.97 0.95 0.96  CALCIUM 9.4  --  9.4    Liver Function Tests:  Recent Labs Lab 08/24/15 0513  AST 20  ALT 27  ALKPHOS 38  BILITOT 0.4  PROT 6.9  ALBUMIN 3.4*   CBC:  Recent Labs Lab 08/23/15 1755 08/23/15 2324 08/24/15 0513  WBC 8.8 9.3 7.0  HGB 12.6* 12.7* 12.8*  HCT 38.0* 38.1* 39.2  MCV 91.1 89.9 90.5  PLT 328 316 301   Cardiac Enzymes:  Recent Labs Lab 08/23/15 1755 08/23/15 2159 08/23/15 2324 08/24/15 0513 08/24/15 1056  TROPONINI <0.03 <0.03 <0.03 <0.03 <0.03     Signed:  Pamella Pert  [facility] 08/24/2015, 5:15 PM

## 2015-08-24 NOTE — Progress Notes (Signed)
PROGRESS NOTE  Subjective:   44 yo man with hx of CAD and recetn PCI for NSTEMI.  [facility]  troponins are negative so far  Pain is pleuretic.   Is getting high dose ASA and  colchicine  Will change to motrin ( his subendocardial MI was a month ago )      Objective:    Vital Signs:   Temp:  [98.2 F (36.8 C)-98.4 F (36.9 C)] 98.3 F (36.8 C) (10/22 0500) Pulse Rate:  [41-48] 43 (10/22 0500) Resp:  [9-18] 16 (10/22 0500) BP: (103-130)/(51-74) 125/56 mmHg (10/22 0500) SpO2:  [98 %-100 %] 99 % (10/22 0500) Weight:  [260 lb (117.935 kg)-270 lb 1.6 oz (122.517 kg)] 270 lb 1.6 oz (122.517 kg) (10/21 2303)  Last BM Date: 08/23/15   24-hour weight change: Weight change:   Weight trends: Filed Weights   08/23/15 1741 08/23/15 2303  Weight: 260 lb (117.935 kg) 270 lb 1.6 oz (122.517 kg)    Intake/Output:  10/21 0701 - 10/22 0700 In: 3 [I.V.:3] Out: -  Total I/O In: 240 [P.O.:240] Out: -    Physical Exam: BP 125/56 mmHg  Pulse 43  Temp(Src) 98.3 F (36.8 C) (Oral)  Resp 16  Ht 5\' 11"  (1.803 m)  Wt 270 lb 1.6 oz (122.517 kg)  BMI 37.69 kg/m2  SpO2 99%  Wt Readings from Last 3 Encounters:  08/23/15 270 lb 1.6 oz (122.517 kg)  08/08/15 274 lb 6.4 oz (124.467 kg)  08/01/15 281 lb 1.4 oz (127.5 kg)    General: Vital signs reviewed and noted.   Head: Normocephalic, atraumatic.  Eyes: conjunctivae/corneas clear.  EOM's intact.   Throat: normal  Neck:  normal   Lungs:    clear   Heart:   RR   Abdomen:  Soft, non-tender, non-distended    Extremities: No edema    Neurologic: A&O X3, CN II - XII are grossly intact. =  Psych: Normal     Labs: BMET:  Recent Labs  08/23/15 1755 08/23/15 2324 08/24/15 0513  NA 139  --  140  K 3.9  --  4.0  CL 106  --  106  CO2 26  --  23  GLUCOSE 98  --  96  BUN 14  --  12  CREATININE 0.97 0.95 0.96  CALCIUM 9.4  --  9.4    Liver function tests:  Recent Labs  08/24/15 0513  AST 20  ALT 27  ALKPHOS 38  BILITOT 0.4  PROT 6.9  ALBUMIN 3.4*   No results for input(s): LIPASE, AMYLASE in the last 72 hours.  CBC:  Recent Labs  08/23/15 2324 08/24/15 0513  WBC 9.3 7.0  HGB 12.7* 12.8*  HCT 38.1* 39.2  MCV 89.9 90.5  PLT 316 301    Cardiac Enzymes:  Recent Labs  08/23/15 2159 08/23/15 2324 08/24/15 0513 08/24/15 1056  TROPONINI <0.03 <0.03 <0.03 <0.03    Coagulation Studies: No results for input(s): LABPROT, INR in the last 72 hours.  Other: Invalid input(s): POCBNP No results for input(s): DDIMER in the last 72 hours. No results for input(s): HGBA1C in the last 72 hours. No results for input(s): CHOL, HDL, LDLCALC, TRIG, CHOLHDL in the last 72 hours. No results for input(s): TSH, T4TOTAL, T3FREE, THYROIDAB in the last 72 hours.  Invalid input(s): FREET3 No results for input(s): VITAMINB12, FOLATE, FERRITIN, TIBC, IRON, RETICCTPCT in the last 72 hours.   Other  results:  EKG  ( personally reviewed )  - sinus brady , no profound ST changes     Medications:    Infusions: . sodium chloride      Scheduled Medications: . aspirin EC  650 mg Oral TID PC  . clopidogrel  75 mg Oral Q breakfast  . colchicine  0.6 mg Oral BID  . enoxaparin (LOVENOX) injection  60 mg Subcutaneous Q24H  . lisinopril  2.5 mg Oral Daily  . rosuvastatin  20 mg Oral Daily  . sodium chloride  3 mL Intravenous Q12H    Assessment/ Plan:   Principal Problem:   Coronary artery disease with unstable angina pectoris (HCC) Active Problems:   Hyperlipidemia   Moderate obesity   Stented coronary artery   Bradycardia   Chest pain  1. 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  Echo looks ok I suspect he can go home later today   2, CAD :  Pain is clearly different than MI pain  Troponin are negative.   3. Possible OSA:  Snores.  RV  looked enlarged in some views.  Wife confirms that he snores. Encouraged him to get a sleep study .   Disposition:  Length of Stay:   Alvia Grove., MD, [facility] 08/24/2015, 12:07 PM Office [phone] Pager [phone]

## 2015-08-24 NOTE — Progress Notes (Signed)
  Echocardiogram 2D Echocardiogram has been performed.  Arvil ChacoFoster, Pammy Vesey 08/24/2015, 1:58 PM

## 2015-08-24 NOTE — Discharge Instructions (Signed)
Follow with Donzetta SprungANIEL, TERRY, MD in 2 weeks  Please get a complete blood count and chemistry panel checked by your Primary MD at your next visit, and again as instructed by your Primary MD. Please get your medications reviewed and adjusted by your Primary MD.  Please request your Primary MD to go over all Hospital Tests and Procedure/Radiological results at the follow up, please get all Hospital records sent to your Prim MD by signing hospital release before you go home.  If you had Pneumonia of Lung problems at the Hospital: Please get a 2 view Chest X ray done in 6-8 weeks after hospital discharge or sooner if instructed by your Primary MD.  If you have Congestive Heart Failure: Please call your Cardiologist or Primary MD anytime you have any of the following symptoms:  1) 3 pound weight gain in 24 hours or 5 pounds in 1 week  2) shortness of breath, with or without a dry hacking cough  3) swelling in the hands, feet or stomach  4) if you have to sleep on extra pillows at night in order to breathe  Follow cardiac low salt diet and 1.5 lit/day fluid restriction.  If you have diabetes Accuchecks 4 times/day, Once in AM empty stomach and then before each meal. Log in all results and show them to your primary doctor at your next visit. If any glucose reading is under 80 or above 300 call your primary MD immediately.  If you have Seizure/Convulsions/Epilepsy: Please do not drive, operate heavy machinery, participate in activities at heights or participate in high speed sports until you have seen by Primary MD or a Neurologist and advised to do so again.  If you had Gastrointestinal Bleeding: Please ask your Primary MD to check a complete blood count within one week of discharge or at your next visit. Your endoscopic/colonoscopic biopsies that are pending at the time of discharge, will also need to followed by your Primary MD.  Get Medicines reviewed and adjusted. Please take all your  medications with you for your next visit with your Primary MD  Please request your Primary MD to go over all hospital tests and procedure/radiological results at the follow up, please ask your Primary MD to get all Hospital records sent to his/her office.  If you experience worsening of your admission symptoms, develop shortness of breath, life threatening emergency, suicidal or homicidal thoughts you must seek medical attention immediately by calling 911 or calling your MD immediately  if symptoms less severe.  You must read complete instructions/literature along with all the possible adverse reactions/side effects for all the Medicines you take and that have been prescribed to you. Take any new Medicines after you have completely understood and accpet all the possible adverse reactions/side effects.   Do not drive or operate heavy machinery when taking Pain medications.   Do not take more than prescribed Pain, Sleep and Anxiety Medications  Special Instructions: If you have smoked or chewed Tobacco  in the last 2 yrs please stop smoking, stop any regular Alcohol  and or any Recreational drug use.  Wear Seat belts while driving.  Please note You were cared for by a hospitalist during your hospital stay. If you have any questions about your discharge medications or the care you received while you were in the hospital after you are discharged, you can call the unit and asked to speak with the hospitalist on call if the hospitalist that took care of you is not available. Once  you are discharged, your primary care physician will handle any further medical issues. Please note that NO REFILLS for any discharge medications will be authorized once you are discharged, as it is imperative that you return to your primary care physician (or establish a relationship with a primary care physician if you do not have one) for your aftercare needs so that they can reassess your need for medications and monitor your  lab values.  You can reach the hospitalist office at phone [phone] or fax [phone]   If you do not have a primary care physician, you can call [phone] for a physician referral.  Activity: As tolerated with Full fall precautions use walker/cane & assistance as needed  Diet: heart healthy  Disposition Home

## 2015-08-29 ENCOUNTER — Encounter (HOSPITAL_COMMUNITY)
Admission: RE | Admit: 2015-08-29 | Discharge: 2015-08-29 | Disposition: A | Discharge status: Home / Self Care | Payer: BLUE CROSS/BLUE SHIELD | Source: Ambulatory Visit | Attending: Cardiology | Admitting: Cardiology

## 2015-08-29 VITALS — BP 118/70 | HR 55 | Ht 71.0 in | Wt 269.5 lb

## 2015-08-29 DIAGNOSIS — Z955 Presence of coronary angioplasty implant and graft: Secondary | ICD-10-CM

## 2015-08-29 DIAGNOSIS — I2510 Atherosclerotic heart disease of native coronary artery without angina pectoris: Secondary | ICD-10-CM | POA: Diagnosis not present

## 2015-08-29 DIAGNOSIS — I214 Non-ST elevation (NSTEMI) myocardial infarction: Secondary | ICD-10-CM | POA: Diagnosis not present

## 2015-08-29 NOTE — Patient Instructions (Signed)
Pt has finished orientation/education and is scheduled to return CR on 09/02/15 at 3:45 pm. Pt has been instructed to arrive to class 15 minutes early for scheduled class. Pt has been instructed to wear comfortable clothing and shoes with rubber soles. Pt has been told to take their medications 1 hour prior to coming to class.  If the patient is not going to attend class, he/she has been instructed to call.

## 2015-08-29 NOTE — Progress Notes (Signed)
Patient arrived for 1st visit/orientation/education at 0800. Patient was referred to CR by Dr. Wyline MoodBranch due to NSTEMI I21.4 and Stent Placement Z95.5.  During orientation advised patient on arrival and appointment times what to wear, what to do before, during and after exercise. Reviewed attendance and class policy. Talked about inclement weather and class consultation policy. Pt is scheduled to return Cardiac Rehab on 09/02/15 at 0345. Pt was advised to come to class 5 minutes before class starts. He was also given instructions on meeting with the dietician and attending the Family Structure classes. Pt is eager to get started. Patient was able to complete 6 minute walk test. Patient was measured for the equipment. Discussed equipment safety with patient. Took patient pre-anthropometric measurements. Patient scored 1 on PHQ-2 and 3 on PHQ-3. Patient does not need counseling at this present time. Patient finished visit at 1009.

## 2015-08-29 NOTE — Progress Notes (Signed)
Cardiac/Pulmonary Rehab Medication Review by a Pharmacist  Does the patient  feel that his/her medications are working for him/her?  yes  Has the patient been experiencing any side effects to the medications prescribed?  no  Does the patient measure his/her own blood pressure or blood glucose at home?  yes   Does the patient have any problems obtaining medications due to transportation or finances?   yes  Understanding of regimen: excellent Understanding of indications: excellent Potential of compliance: excellent    Pharmacist comments: Patient compliant with medications. Recent admission for pericarditis which he is on colchicine and ibuprofen for.  Reviewed s/s of bleeding and how to manage and ED is an option if bleeding cannot be stopped. Monitor BP and keep a log.  Continue current regimen.   Thanks for the opportunity to review and discuss with this patient.   Elder CyphersLorie Herrick Hartog, BS Pharm D, [place] Clinical Pharmacist Pager [phone]   08/29/2015 9:08 AM

## 2015-09-02 ENCOUNTER — Encounter (HOSPITAL_COMMUNITY)
Admission: RE | Admit: 2015-09-02 | Discharge: 2015-09-02 | Disposition: A | Discharge status: Home / Self Care | Payer: BLUE CROSS/BLUE SHIELD | Source: Ambulatory Visit | Attending: Cardiology | Admitting: Cardiology

## 2015-09-02 DIAGNOSIS — I214 Non-ST elevation (NSTEMI) myocardial infarction: Secondary | ICD-10-CM | POA: Diagnosis not present

## 2015-09-04 ENCOUNTER — Encounter (HOSPITAL_COMMUNITY): Payer: BLUE CROSS/BLUE SHIELD

## 2015-09-06 ENCOUNTER — Encounter (HOSPITAL_COMMUNITY): Payer: BLUE CROSS/BLUE SHIELD

## 2015-09-09 ENCOUNTER — Encounter (HOSPITAL_COMMUNITY)
Admission: RE | Admit: 2015-09-09 | Discharge: 2015-09-09 | Disposition: A | Discharge status: Home / Self Care | Payer: BLUE CROSS/BLUE SHIELD | Source: Ambulatory Visit | Attending: Cardiology | Admitting: Cardiology

## 2015-09-09 DIAGNOSIS — I2510 Atherosclerotic heart disease of native coronary artery without angina pectoris: Secondary | ICD-10-CM | POA: Diagnosis not present

## 2015-09-09 DIAGNOSIS — I214 Non-ST elevation (NSTEMI) myocardial infarction: Secondary | ICD-10-CM | POA: Diagnosis present

## 2015-09-09 DIAGNOSIS — Z955 Presence of coronary angioplasty implant and graft: Secondary | ICD-10-CM | POA: Insufficient documentation

## 2015-09-09 NOTE — Progress Notes (Signed)
Cardiac Rehabilitation Program Outcomes Report   Orientation:  08/29/15 Graduate Date:  tbd Discharge Date:  tbd # of sessions completed: 3  Cardiologist: Branch Family MD:  Ina Kickaniel Class Time:  1545  A.  Exercise Program:  Tolerates exercise @ 3.75 METS for 15 minutes and Walk Test Results:  Pre: 2.96 mets  B.  Mental Health:  Good mental attitude and PHQ-9: 3.  patient stated he does not feel he needs counseling.   C.  Education/Instruction/Skills  Accurately checks own pulse.  Rest:  66  Exercise:  109 and Knows THR for exercise  Uses Perceived Exertion Scale and/or Dyspnea Scale  D.  Nutrition/Weight Control/Body Composition:  Adherence to prescribed nutrition program: fair    E.  Blood Lipids   No results found for: CHOL, HDL, LDLCALC, LDLDIRECT, TRIG, CHOLHDL  F.  Lifestyle Changes:  Making positive lifestyle changes and Not smoking:  Quit 1998  G.  Symptoms noted with exercise:  Asymptomatic  Report Completed By:  Doretha Sou Syrai Gladwin RN   Comments:  This is the patients first week progress note for [facility].

## 2015-09-11 ENCOUNTER — Encounter (HOSPITAL_COMMUNITY): Payer: BLUE CROSS/BLUE SHIELD

## 2015-09-13 ENCOUNTER — Encounter (HOSPITAL_COMMUNITY)
Admission: RE | Admit: 2015-09-13 | Discharge: 2015-09-13 | Disposition: A | Discharge status: Home / Self Care | Payer: BLUE CROSS/BLUE SHIELD | Source: Ambulatory Visit | Attending: Cardiology | Admitting: Cardiology

## 2015-09-13 DIAGNOSIS — I214 Non-ST elevation (NSTEMI) myocardial infarction: Secondary | ICD-10-CM | POA: Diagnosis not present

## 2015-09-16 ENCOUNTER — Encounter (HOSPITAL_COMMUNITY)
Admission: RE | Admit: 2015-09-16 | Discharge: 2015-09-16 | Disposition: A | Discharge status: Home / Self Care | Payer: BLUE CROSS/BLUE SHIELD | Source: Ambulatory Visit | Attending: Cardiology | Admitting: Cardiology

## 2015-09-16 DIAGNOSIS — I214 Non-ST elevation (NSTEMI) myocardial infarction: Secondary | ICD-10-CM | POA: Diagnosis not present

## 2015-09-18 ENCOUNTER — Encounter (HOSPITAL_COMMUNITY): Payer: BLUE CROSS/BLUE SHIELD

## 2015-09-20 ENCOUNTER — Encounter (HOSPITAL_COMMUNITY): Payer: BLUE CROSS/BLUE SHIELD

## 2015-09-23 ENCOUNTER — Encounter (HOSPITAL_COMMUNITY)
Admission: RE | Admit: 2015-09-23 | Discharge: 2015-09-23 | Disposition: A | Discharge status: Home / Self Care | Payer: BLUE CROSS/BLUE SHIELD | Source: Ambulatory Visit | Attending: Cardiology | Admitting: Cardiology

## 2015-09-23 DIAGNOSIS — I214 Non-ST elevation (NSTEMI) myocardial infarction: Secondary | ICD-10-CM | POA: Diagnosis not present

## 2015-09-25 ENCOUNTER — Encounter (HOSPITAL_COMMUNITY): Payer: BLUE CROSS/BLUE SHIELD

## 2015-09-27 ENCOUNTER — Encounter (HOSPITAL_COMMUNITY): Payer: BLUE CROSS/BLUE SHIELD

## 2015-09-30 ENCOUNTER — Encounter (HOSPITAL_COMMUNITY)
Admission: RE | Admit: 2015-09-30 | Discharge: 2015-09-30 | Disposition: A | Discharge status: Home / Self Care | Payer: BLUE CROSS/BLUE SHIELD | Source: Ambulatory Visit | Attending: Cardiology | Admitting: Cardiology

## 2015-09-30 DIAGNOSIS — I214 Non-ST elevation (NSTEMI) myocardial infarction: Secondary | ICD-10-CM | POA: Diagnosis not present

## 2015-10-02 ENCOUNTER — Encounter (HOSPITAL_COMMUNITY): Payer: BLUE CROSS/BLUE SHIELD

## 2015-10-04 ENCOUNTER — Encounter (HOSPITAL_COMMUNITY): Payer: BLUE CROSS/BLUE SHIELD

## 2015-10-07 ENCOUNTER — Encounter (HOSPITAL_COMMUNITY): Payer: BLUE CROSS/BLUE SHIELD

## 2015-10-08 ENCOUNTER — Ambulatory Visit (INDEPENDENT_AMBULATORY_CARE_PROVIDER_SITE_OTHER): Payer: BLUE CROSS/BLUE SHIELD | Admitting: Adult Health

## 2015-10-08 ENCOUNTER — Encounter: Payer: Self-pay | Admitting: Adult Health

## 2015-10-08 VITALS — BP 120/66 | HR 78 | Ht 71.0 in | Wt 267.4 lb

## 2015-10-08 DIAGNOSIS — I2510 Atherosclerotic heart disease of native coronary artery without angina pectoris: Secondary | ICD-10-CM | POA: Diagnosis not present

## 2015-10-08 DIAGNOSIS — I309 Acute pericarditis, unspecified: Secondary | ICD-10-CM

## 2015-10-08 MED ORDER — COLCHICINE 0.6 MG PO TABS: 0.6000 mg | ORAL_TABLET | Freq: Two times a day (BID) | ORAL | Status: DC

## 2015-10-08 NOTE — Progress Notes (Signed)
Cardiology Office Note   Date:  10/08/2015   ID:  CAEDIN MOGAN, DOB 26-Jan-1971, MRN [id number]  PCP:  Donzetta Sprung, MD  Cardiologist: Arlington Calix, NP   Chief Complaint  Patient presents with  . Coronary Artery Disease  . Chest Pain      History of Present Illness: Alex Barnes is a 44 y.o. male who presents for ongoing assessment and management of CAD, cardiac catheterization demonstrating total occlusion of the right coronary artery beyond the origin of the PDA.,75% to 80%, mid to distal circumflex stenosis. High-grade complex stenosis in the second obtuse marginal branch, which is small to moderate in size, widely patent LAD with dominant branching ramus intermedius. Normal LV systolic function. The patient ultimately received a drug-eluting stent to the mid circumflex extending into OM 2. This is a long stent covering multiple lesions in Sept of 2016.   On last visit, he was complaining of dizziness. We continued to monitor this as if was felt it was related to new medications. He denied syncope. He was not orthostatic.   He was readmitted for recurrent chest pain one month ago and diagnosed with pericarditis. He was started on a course of motrin and colchicine for one week. Symptoms abated but returned about one week afterward. He is having recurrent pain now that is constant and worsening.   Past Medical History  Diagnosis Date  . Hypercholesteremia   . NSTEMI (non-ST elevated myocardial infarction) (HCC) 07/28/2015  . Headache     "weekly" (07/29/2015)  . CAD (coronary artery disease)     cath 9/26 & 07/31/2015 100% total occlusion of post atrio branch of RCA, LV supplied by collaterals from the L atrial recurrent branch of distal LCx, 75-80% mid to distal LCx stenosis, high grade complex stenosis of OM2, patent LAD and RI, EF 55-60%. On 9/8, single DES to midLCx extending into OM2.    Past Surgical History  Procedure Laterality Date  . Wisdom tooth  extraction  1993  . Tonsillectomy  ~ 1989  . Cardiac catheterization  2006; 07/29/2015  . Cardiac catheterization N/A 07/29/2015    Procedure: Left Heart Cath and Coronary Angiography;  Surgeon: Lyn Records, MD;  Location: [facility] INVASIVE CV [facility];  Service: Cardiovascular;  Laterality: N/A;  . Cardiac catheterization N/A 07/31/2015    Procedure: Coronary Stent Intervention;  Surgeon: Iran Ouch, MD;  Location: [facility] INVASIVE CV LAB;  Service: Cardiovascular;  Laterality: N/A;  . Cardiac catheterization N/A 07/31/2015    Procedure: Left Heart Cath and Coronary Angiography;  Surgeon: Iran Ouch, MD;  Location: [facility] INVASIVE CV LAB;  Service: Cardiovascular;  Laterality: N/A;     Current Outpatient Prescriptions  Medication Sig Dispense Refill  . aspirin 81 MG chewable tablet Chew 1 tablet (81 mg total) by mouth daily.    . clopidogrel (PLAVIX) 75 MG tablet Take 1 tablet (75 mg total) by mouth daily with breakfast. 90 tablet 3  . ibuprofen (ADVIL,MOTRIN) 400 MG tablet Take 1 tablet (400 mg total) by mouth 3 (three) times daily. 21 tablet 0  . lisinopril (PRINIVIL,ZESTRIL) 2.5 MG tablet Take 1 tablet (2.5 mg total) by mouth daily. 90 tablet 3  . nitroGLYCERIN (NITROSTAT) 0.4 MG SL tablet Place 1 tablet (0.4 mg total) under the tongue every 5 (five) minutes as needed for chest pain. 25 tablet 3  . oxyCODONE-acetaminophen (ROXICET) 5-325 MG tablet Take 1-2 tablets by mouth every 6 (six) hours as needed for severe pain. 30 tablet  0  . rosuvastatin (CRESTOR) 10 MG tablet Take 10 mg by mouth daily.  6   No current facility-administered medications for this visit.    Allergies:   Review of patient's allergies indicates no known allergies.    Social History:  The patient  reports that he quit smoking about 18 years ago. His smoking use included Cigarettes. He has a 5 pack-year smoking history. He has quit using smokeless tobacco. He reports that he drinks alcohol. He reports that he does not use  illicit drugs.   Family History:  The patient's family history is not on file.    ROS: All other systems are reviewed and negative. Unless otherwise mentioned in H&P    PHYSICAL EXAM: VS:  BP 120/66 mmHg  Pulse 78  Ht 5\' 11"  (1.803 m)  Wt 267 lb 6.4 oz (121.292 kg)  BMI 37.31 kg/m2  SpO2 98% , BMI Body mass index is 37.31 kg/(m^2). GEN: Well nourished, well developed, in no acute distress HEENT: normal Neck: no JVD, carotid bruits, or masses Cardiac: RRR; no murmurs, no rubs, or gallops,no edema  Respiratory:  clear to auscultation bilaterally, normal work of breathing GI: soft, nontender, nondistended, + BS MS: no deformity or atrophy Skin: warm and dry, no rash Neuro:  Strength and sensation are intact Psych: euthymic mood, full affect  Recent Labs: 08/24/2015: ALT 27; BUN 12; Creatinine, Ser 0.96; Hemoglobin 12.8*; Platelets 301; Potassium 4.0; Sodium 140    Lipid Panel No results found for: CHOL, TRIG, HDL, CHOLHDL, VLDL, LDLCALC, LDLDIRECT    Wt Readings from Last 3 Encounters:  10/08/15 267 lb 6.4 oz (121.292 kg)  08/29/15 269 lb 8 oz (122.244 kg)  08/23/15 270 lb 1.6 oz (122.517 kg)    ASSESSMENT AND PLAN:  1. Pericarditis: [facility] [place] She advises a longer course of colchicine.Marland Kitchen. 0.6 mg BID for 3 months. He will also be given low dose of tramadol, 50 mg Q 6 hours prn for a short term help with pain to avoid narcotics. I have explained this to his wife and the patient who verbalizes understanding. I will repeat echocardiogram to evaluate for effusion.    2. CAD: Stent to the mid circumflex with long stent in place covering multiple lesions in Oct 2016.  He will continue DAPT with plavix and ASA. I will check a BMET before his next appointment in one month.    Current medicines are reviewed at length with the patient today.    Labs/ tests ordered today include: BMET and Echocardiogram No orders of the defined types were  placed in this encounter.     Disposition:   FU with one month  Signed, Joni ReiningKathryn Chante Mayson, NP  10/08/2015 2:16 PM    [facility] [address], [place], [place] [address] Phone: [phone]; Fax: [phone]

## 2015-10-08 NOTE — Addendum Note (Signed)
Addended by: Marlyn CorporalARLTON, CATHERINE A on: 10/08/2015 02:27 PM   Modules accepted: Orders

## 2015-10-08 NOTE — Progress Notes (Signed)
Name: Alex Barnes    DOB: 01-Jul-1971  Age: 44 y.o.  MR#: [id number]       PCP:  Donzetta Sprung, MD      Insurance: Payor: BLUE CROSS BLUE SHIELD / Plan: BCBS OTHER / Product Type: *No Product type* /   CC:    Chief Complaint  Patient presents with  . Coronary Artery Disease    VS Filed Vitals:   10/08/15 1338  BP: 120/66  Pulse: 78  Height:  (1.803 m)  Weight: 267 lb 6.4 oz (121.292 kg)  SpO2: 98%    Weights Current Weight  10/08/15 267 lb 6.4 oz (121.292 kg)  08/29/15 269 lb 8 oz (122.244 kg)  08/23/15 270 lb 1.6 oz (122.517 kg)    Blood Pressure  BP Readings from Last 3 Encounters:  10/08/15 120/66  08/29/15 118/70  08/24/15 124/58     Admit date:  (Not on file) Last encounter with [facility]:  08/08/2015   Allergy Review of patient's allergies indicates no known allergies.  Current Outpatient Prescriptions  Medication Sig Dispense Refill  . aspirin 81 MG chewable tablet Chew 1 tablet (81 mg total) by mouth daily.    . clopidogrel (PLAVIX) 75 MG tablet Take 1 tablet (75 mg total) by mouth daily with breakfast. 90 tablet 3  . ibuprofen (ADVIL,MOTRIN) 400 MG tablet Take 1 tablet (400 mg total) by mouth 3 (three) times daily. 21 tablet 0  . lisinopril (PRINIVIL,ZESTRIL) 2.5 MG tablet Take 1 tablet (2.5 mg total) by mouth daily. 90 tablet 3  . nitroGLYCERIN (NITROSTAT) 0.4 MG SL tablet Place 1 tablet (0.4 mg total) under the tongue every 5 (five) minutes as needed for chest pain. 25 tablet 3  . oxyCODONE-acetaminophen (ROXICET) 5-325 MG tablet Take 1-2 tablets by mouth every 6 (six) hours as needed for severe pain. 30 tablet 0  . rosuvastatin (CRESTOR) 10 MG tablet Take 10 mg by mouth daily.  6   No current facility-administered medications for this visit.    Discontinued Meds:    Medications Discontinued During This Encounter  Medication Reason  . colchicine 0.6 MG tablet Error    Patient Active Problem List   Diagnosis Date Noted  . Bradycardia 08/23/2015  .  Chest pain 08/23/2015  . Stented coronary artery   . Moderate obesity 07/31/2015  . Hyperlipidemia 07/30/2015  . 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 TIA (transient ischemic attack) 07/30/2015  . NSTEMI (non-ST elevated myocardial infarction) (HCC) 07/29/2015    LABS    Component Value Date/Time   NA 140 08/24/2015 0513   NA 139 08/23/2015 1755   NA 140 08/01/2015 0233   K 4.0 08/24/2015 0513   K 3.9 08/23/2015 1755   K 4.0 08/01/2015 0233   CL 106 08/24/2015 0513   CL 106 08/23/2015 1755   CL 108 08/01/2015 0233   CO2 23 08/24/2015 0513   CO2 26 08/23/2015 1755   CO2 25 08/01/2015 0233   GLUCOSE 96 08/24/2015 0513   GLUCOSE 98 08/23/2015 1755   GLUCOSE 117* 08/01/2015 0233   BUN 12 08/24/2015 0513   BUN 14 08/23/2015 1755   BUN 8 08/01/2015 0233   CREATININE 0.96 08/24/2015 0513   CREATININE 0.95 08/23/2015 2324   CREATININE 0.97 08/23/2015 1755   CALCIUM 9.4 08/24/2015 0513   CALCIUM 9.4 08/23/2015 1755   CALCIUM 8.9 08/01/2015 0233   GFRNONAA >60 08/24/2015 0513   GFRNONAA >60 08/23/2015 2324   GFRNONAA >60  08/23/2015 1755   GFRAA >60 08/24/2015 0513   GFRAA >60 08/23/2015 2324   GFRAA >60 08/23/2015 1755   CMP     Component Value Date/Time   NA 140 08/24/2015 0513   K 4.0 08/24/2015 0513   CL 106 08/24/2015 0513   CO2 23 08/24/2015 0513   GLUCOSE 96 08/24/2015 0513   BUN 12 08/24/2015 0513   CREATININE 0.96 08/24/2015 0513   CALCIUM 9.4 08/24/2015 0513   PROT 6.9 08/24/2015 0513   ALBUMIN 3.4* 08/24/2015 0513   AST 20 08/24/2015 0513   ALT 27 08/24/2015 0513   ALKPHOS 38 08/24/2015 0513   BILITOT 0.4 08/24/2015 0513   GFRNONAA >60 08/24/2015 0513   GFRAA >60 08/24/2015 0513       Component Value Date/Time   WBC 7.0 08/24/2015 0513   WBC 9.3 08/23/2015 2324   WBC 8.8 08/23/2015 1755   HGB 12.8* 08/24/2015 0513   HGB 12.7* 08/23/2015 2324   HGB 12.6* 08/23/2015 1755   HCT 39.2 08/24/2015 0513   HCT 38.1*  08/23/2015 2324   HCT 38.0* 08/23/2015 1755   MCV 90.5 08/24/2015 0513   MCV 89.9 08/23/2015 2324   MCV 91.1 08/23/2015 1755    Lipid Panel  No results found for: CHOL, TRIG, HDL, CHOLHDL, VLDL, LDLCALC, LDLDIRECT  ABG No results found for: PHART, PCO2ART, PO2ART, HCO3, TCO2, ACIDBASEDEF, O2SAT   No results found for: TSH BNP (last 3 results) No results for input(s): BNP in the last 8760 hours.  ProBNP (last 3 results) No results for input(s): PROBNP in the last 8760 hours.  Cardiac Panel (last 3 results) No results for input(s): CKTOTAL, CKMB, TROPONINI, RELINDX in the last 72 hours.  Iron/TIBC/Ferritin/ %Sat No results found for: IRON, TIBC, FERRITIN, IRONPCTSAT   EKG Orders placed or performed during the hospital encounter of 08/23/15  . Repeat EKG  . Repeat EKG  . EKG 12-Lead  . EKG 12-Lead  . EKG 12-Lead  . EKG 12-Lead  . EKG     Prior Assessment and Plan Problem List as of 10/08/2015      Cardiovascular and Mediastinum   NSTEMI (non-ST elevated myocardial infarction) [facility](HCC)   Coronary artery disease with unstable angina pectoris (HCC)   TIA (transient ischemic attack)     Other   Hyperlipidemia   Moderate obesity   Stented coronary artery   Bradycardia   Chest pain       Imaging: No results found.

## 2015-10-08 NOTE — Patient Instructions (Addendum)
Your physician recommends that you schedule a follow-up appointment in: 3 months with Harriet PhoK Lawrence NP   Take colchicine 0.6 mg twice a day for twice a day for 3 months  Take tramadol for pain as directed  Please get lab work : BMET JUST BEFORE NEXT VISIT    Your physician has requested that you have an echocardiogram. Echocardiography is a painless test that uses sound waves to create images of your heart. It provides your doctor with information about the size and shape of your heart and how well your heart's chambers and valves are working. This procedure takes approximately one hour. There are no restrictions for this procedure.

## 2015-10-09 ENCOUNTER — Encounter (HOSPITAL_COMMUNITY): Payer: BLUE CROSS/BLUE SHIELD

## 2015-10-09 ENCOUNTER — Telehealth: Payer: Self-pay | Admitting: Adult Health

## 2015-10-09 ENCOUNTER — Telehealth: Payer: Self-pay

## 2015-10-09 NOTE — Telephone Encounter (Signed)
Pt's ins has denied his Colchicine due to it not being Cardiology related and would like someone to call and speak w/ their ins company.

## 2015-10-09 NOTE — Telephone Encounter (Signed)
Prior auth for Colchicine 0.6mg  tabs sent to [facility].

## 2015-10-09 NOTE — Telephone Encounter (Signed)
Case ID # for Prior Authorization is [id number]. Notification will be sent with in 72 hours to both pt and office.

## 2015-10-11 ENCOUNTER — Ambulatory Visit (HOSPITAL_COMMUNITY)
Admission: RE | Admit: 2015-10-11 | Discharge: 2015-10-11 | Disposition: A | Discharge status: Home / Self Care | Payer: BLUE CROSS/BLUE SHIELD | Source: Ambulatory Visit | Attending: Adult Health | Admitting: Adult Health

## 2015-10-11 ENCOUNTER — Encounter (HOSPITAL_COMMUNITY): Payer: BLUE CROSS/BLUE SHIELD | Attending: Cardiology

## 2015-10-11 DIAGNOSIS — E785 Hyperlipidemia, unspecified: Secondary | ICD-10-CM | POA: Insufficient documentation

## 2015-10-11 DIAGNOSIS — I2510 Atherosclerotic heart disease of native coronary artery without angina pectoris: Secondary | ICD-10-CM | POA: Diagnosis not present

## 2015-10-11 DIAGNOSIS — Z955 Presence of coronary angioplasty implant and graft: Secondary | ICD-10-CM | POA: Insufficient documentation

## 2015-10-11 DIAGNOSIS — I309 Acute pericarditis, unspecified: Secondary | ICD-10-CM | POA: Diagnosis present

## 2015-10-11 DIAGNOSIS — I252 Old myocardial infarction: Secondary | ICD-10-CM | POA: Diagnosis not present

## 2015-10-11 DIAGNOSIS — I071 Rheumatic tricuspid insufficiency: Secondary | ICD-10-CM | POA: Insufficient documentation

## 2015-10-11 DIAGNOSIS — I214 Non-ST elevation (NSTEMI) myocardial infarction: Secondary | ICD-10-CM | POA: Insufficient documentation

## 2015-10-11 DIAGNOSIS — I319 Disease of pericardium, unspecified: Secondary | ICD-10-CM

## 2015-10-11 DIAGNOSIS — G459 Transient cerebral ischemic attack, unspecified: Secondary | ICD-10-CM | POA: Diagnosis not present

## 2015-10-11 NOTE — Progress Notes (Signed)
Called patient today 10/11/15 to check on him because he has not been in Cardiac Rehab since 09/30/15. Patient stated he has not been feeling well and has had recurring chest discomfort. Patient shared that he was instructed by Bailey MechKatherine Lawrence, NP at last office visit to hold off from coming to Cardiac Rehab class for a couple of weeks. We will check back with patient in the New Year to see if he is feeling stronger and has been released to return. Hart Rochesteriane Ananias Kolander, Manager Entry on 10/11/15

## 2015-10-14 ENCOUNTER — Telehealth: Payer: Self-pay

## 2015-10-14 ENCOUNTER — Encounter (HOSPITAL_COMMUNITY): Payer: BLUE CROSS/BLUE SHIELD

## 2015-10-14 NOTE — Telephone Encounter (Signed)
LMTCB-12/12- LM

## 2015-10-14 NOTE — Telephone Encounter (Signed)
Colchicine 0.6mg  tablets approved . Case ID [id number]. Good through 01/18/2016.

## 2015-10-16 ENCOUNTER — Encounter (HOSPITAL_COMMUNITY): Payer: BLUE CROSS/BLUE SHIELD

## 2015-10-18 ENCOUNTER — Encounter (HOSPITAL_COMMUNITY): Payer: BLUE CROSS/BLUE SHIELD

## 2015-10-21 ENCOUNTER — Encounter (HOSPITAL_COMMUNITY): Payer: BLUE CROSS/BLUE SHIELD

## 2015-10-23 ENCOUNTER — Encounter (HOSPITAL_COMMUNITY): Payer: BLUE CROSS/BLUE SHIELD

## 2015-10-25 ENCOUNTER — Encounter (HOSPITAL_COMMUNITY): Payer: BLUE CROSS/BLUE SHIELD

## 2015-10-28 ENCOUNTER — Encounter (HOSPITAL_COMMUNITY): Payer: BLUE CROSS/BLUE SHIELD

## 2015-10-29 ENCOUNTER — Telehealth: Payer: Self-pay | Admitting: Adult Health

## 2015-10-29 NOTE — Telephone Encounter (Signed)
Called pt and he states that he needs a refill on his tramadol, but he states he  also has a horrible chest cold which is making his pericarditis hurt worse and would like an antibiotic. Please advise

## 2015-10-29 NOTE — Telephone Encounter (Signed)
No refills on tramadol. He should stay on colchicine and switch to ES Tylenol. If no relief, see PCP.

## 2015-10-29 NOTE — Telephone Encounter (Signed)
States that patient needs refill on Tramadol that KL prescribed and would like to ask if any antibiotics would have affect on any of the "heart medications". / tg

## 2015-10-29 NOTE — Telephone Encounter (Signed)
Spoke to pt- made him aware. He voiced undedrstanding.

## 2015-10-30 ENCOUNTER — Encounter (HOSPITAL_COMMUNITY): Payer: BLUE CROSS/BLUE SHIELD

## 2015-11-01 ENCOUNTER — Encounter (HOSPITAL_COMMUNITY): Payer: BLUE CROSS/BLUE SHIELD

## 2015-11-04 ENCOUNTER — Encounter (HOSPITAL_COMMUNITY): Payer: BLUE CROSS/BLUE SHIELD

## 2015-11-06 ENCOUNTER — Encounter (HOSPITAL_COMMUNITY): Payer: BLUE CROSS/BLUE SHIELD

## 2015-11-08 ENCOUNTER — Encounter (HOSPITAL_COMMUNITY): Payer: BLUE CROSS/BLUE SHIELD

## 2015-11-11 ENCOUNTER — Encounter (HOSPITAL_COMMUNITY): Payer: BLUE CROSS/BLUE SHIELD

## 2015-11-12 NOTE — Addendum Note (Signed)
Encounter addended by: Andrey Cotahristy M Aireona Torelli, RN on: 11/12/2015 10:37 AM<BR>     Documentation filed: Notes Section

## 2015-11-12 NOTE — Progress Notes (Signed)
Entry date 11/12/15- Patient was sent discharge letter 11/05/15 for D/C from [facility].  Patient did not call back stating he was going to return.

## 2015-11-13 ENCOUNTER — Encounter (HOSPITAL_COMMUNITY): Payer: BLUE CROSS/BLUE SHIELD

## 2015-11-15 ENCOUNTER — Encounter (HOSPITAL_COMMUNITY): Payer: BLUE CROSS/BLUE SHIELD

## 2015-11-18 ENCOUNTER — Encounter (HOSPITAL_COMMUNITY): Payer: BLUE CROSS/BLUE SHIELD

## 2015-11-20 ENCOUNTER — Encounter (HOSPITAL_COMMUNITY): Payer: BLUE CROSS/BLUE SHIELD

## 2015-11-22 ENCOUNTER — Encounter (HOSPITAL_COMMUNITY): Payer: BLUE CROSS/BLUE SHIELD

## 2015-12-09 ENCOUNTER — Ambulatory Visit: Payer: BLUE CROSS/BLUE SHIELD | Admitting: Cardiology

## 2015-12-27 ENCOUNTER — Other Ambulatory Visit: Payer: Self-pay | Admitting: *Deleted

## 2015-12-27 ENCOUNTER — Ambulatory Visit (INDEPENDENT_AMBULATORY_CARE_PROVIDER_SITE_OTHER): Payer: BLUE CROSS/BLUE SHIELD | Admitting: Cardiology

## 2015-12-27 ENCOUNTER — Encounter: Payer: Self-pay | Admitting: Cardiology

## 2015-12-27 VITALS — BP 149/82 | HR 60 | Ht 71.0 in | Wt 262.8 lb

## 2015-12-27 DIAGNOSIS — R079 Chest pain, unspecified: Secondary | ICD-10-CM

## 2015-12-27 DIAGNOSIS — I2510 Atherosclerotic heart disease of native coronary artery without angina pectoris: Secondary | ICD-10-CM | POA: Diagnosis not present

## 2015-12-27 MED ORDER — TRAMADOL HCL 50 MG PO TABS: 50.0000 mg | ORAL_TABLET | Freq: Four times a day (QID) | ORAL | Status: DC | PRN

## 2015-12-27 MED ORDER — COLCHICINE 0.6 MG PO TABS: 0.6000 mg | ORAL_TABLET | Freq: Two times a day (BID) | ORAL | Status: DC

## 2015-12-27 NOTE — Progress Notes (Signed)
Patient ID: Alex Barnes, male   DOB: 11-18-1970, 45 y.o.   MRN: [id number]     Clinical Summary Mr. Alex Barnes is a 45 y.o.male seen today for follow up of the following medical problems.   1. CAD - admit 07/2015 with NSTEMI. Found to have totally occluded RCA, as well as significant LCX and OM2 disease. S/p DES to LCX and OM2, RCA occlusion managed medically. - 10/2015 echo LVEF 55-60%, no WMAs - treated for post-MI pericarditis with colchicine and ASA/NSAIDs. He has bee on long term colchicine.   - still with some chest pain since last visit. Sharp pain right breast into left chest and rib cage. Varies in severity, worst 8/10. Tends to occur with activity, position. Worst with sleeping on right shoulder. Worst with deep breathing. Worst with deep breathing. Ongoing since October. Constant pain, never resolves. Better with tramadol, better with percocet.  - reports one episode where symptoms were much worst after drinking rum   Past Medical History  Diagnosis Date  . Hypercholesteremia   . NSTEMI (non-ST elevated myocardial infarction) ([facility]) 07/28/2015  . Headache     "weekly" (07/29/2015)  . CAD (coronary artery disease)     cath 9/26 & 07/31/2015 100% total occlusion of post atrio branch of RCA, LV supplied by collaterals from the L atrial recurrent branch of distal LCx, 75-80% mid to distal LCx stenosis, high grade complex stenosis of OM2, patent LAD and RI, EF 55-60%. On 9/8, single DES to midLCx extending into OM2.     No Known Allergies   Current Outpatient Prescriptions  Medication Sig Dispense Refill  . aspirin 81 MG chewable tablet Chew 1 tablet (81 mg total) by mouth daily.    . clopidogrel (PLAVIX) 75 MG tablet Take 1 tablet (75 mg total) by mouth daily with breakfast. 90 tablet 3  . colchicine 0.6 MG tablet Take 1 tablet (0.6 mg total) by mouth 2 (two) times daily. 180 tablet 0  . ibuprofen (ADVIL,MOTRIN) 400 MG tablet Take 1 tablet (400 mg total) by mouth 3 (three) times  daily. 21 tablet 0  . lisinopril (PRINIVIL,ZESTRIL) 2.5 MG tablet Take 1 tablet (2.5 mg total) by mouth daily. 90 tablet 3  . nitroGLYCERIN (NITROSTAT) 0.4 MG SL tablet Place 1 tablet (0.4 mg total) under the tongue every 5 (five) minutes as needed for chest pain. 25 tablet 3  . oxyCODONE-acetaminophen (ROXICET) 5-325 MG tablet Take 1-2 tablets by mouth every 6 (six) hours as needed for severe pain. 30 tablet 0  . rosuvastatin (CRESTOR) 10 MG tablet Take 10 mg by mouth daily.  6   No current facility-administered medications for this visit.     Past Surgical History  Procedure Laterality Date  . Wisdom tooth extraction  1993  . Tonsillectomy  ~ 1989  . Cardiac catheterization  2006; 07/29/2015  . Cardiac catheterization N/A 07/29/2015    Procedure: Left Heart Cath and Coronary Angiography;  Surgeon: Belva Crome, MD;  Location: [place];  Service: Cardiovascular;  Laterality: N/A;  . Cardiac catheterization N/A 07/31/2015    Procedure: Coronary Stent Intervention;  Surgeon: Wellington Hampshire, MD;  Location: [place];  Service: Cardiovascular;  Laterality: N/A;  . Cardiac catheterization N/A 07/31/2015    Procedure: Left Heart Cath and Coronary Angiography;  Surgeon: Wellington Hampshire, MD;  Location: [place] [facility];  Service: Cardiovascular;  Laterality: N/A;     No Known Allergies   Family history Denies any family history  of heart disease   Social History Mr. Alex Barnes reports that he quit smoking about 19 years ago. His smoking use included Cigarettes. He has a 5 pack-year smoking history. He has quit using smokeless tobacco. Mr. Alex Barnes reports that he drinks alcohol.   Review of Systems CONSTITUTIONAL: No weight loss, fever, chills, weakness or fatigue.  HEENT: Eyes: No visual loss, blurred vision, double vision or yellow sclerae.No hearing loss, sneezing, congestion, runny nose or sore throat.  SKIN: No rash or itching.  CARDIOVASCULAR:  RESPIRATORY: No  shortness of breath, cough or sputum.  GASTROINTESTINAL: No anorexia, nausea, vomiting or diarrhea. No abdominal pain or blood.  GENITOURINARY: No burning on urination, no polyuria NEUROLOGICAL: No headache, dizziness, syncope, paralysis, ataxia, numbness or tingling in the extremities. No change in bowel or bladder control.  MUSCULOSKELETAL: No muscle, back pain, joint pain or stiffness.  LYMPHATICS: No enlarged nodes. No history of splenectomy.  PSYCHIATRIC: No history of depression or anxiety.  ENDOCRINOLOGIC: No reports of sweating, cold or heat intolerance. No polyuria or polydipsia.  Marland Kitchen   Physical Examination Filed Vitals:   12/27/15 1427  BP: 149/82  Pulse: 60   Filed Vitals:   12/27/15 1427  Height: '5\' 11"'  (1.803 m)  Weight: 262 lb 12.8 oz (119.205 kg)    Gen: resting comfortably, no acute distress HEENT: no scleral icterus, pupils equal round and reactive, no palptable cervical adenopathy,  CV: RRR, no m/r/g, no jvd Resp: Clear to auscultation bilaterally GI: abdomen is soft, non-tender, non-distended, normal bowel sounds, no hepatosplenomegaly MSK: extremities are warm, no edema.  Skin: warm, no rash Neuro:  no focal deficits Psych: appropriate affect   Diagnostic Studies  07/2015 cath  Ost 2nd Mrg to 2nd Mrg lesion, 85% stenosed.  Prox Cx lesion, 75% stenosed.  Post Atrio lesion, 100% stenosed.  Ost LAD to Prox LAD lesion, 30% stenosed.  Mid RCA to Dist RCA lesion, 40% stenosed.   Recent acute inferolateral infarction now greater than 18 hours old. Angiography demonstrates total occlusion of the RCA beyond the origin of the PDA. The left ventricular branch is supplied by collaterals from the left atrial recurrent branch of the distal circumflex.  75-80% mid to distal circumflex stenosis. High-grade complex stenosis in the second obtuse marginal branch which is small to moderate in size.  Widely patent LAD and a dominant branching ramus  intermedius.  Overall normal LV function with EF 55-60%, and inferobasal hypokinesis.    Assessment and Plan   1. CAD/Chest pain - s/p NSTEMI and stenting 07/2015. Since that time he has had atypical chest- pain, thought initially to be pericarditis however symptoms persist several months later despite extended course of NSAIDs and colchicine - will repeat ESR and CRP. Will order hepatic panel to look for any liver or gallbladder pathologly. Order noncontrast CT of chest to evaluate for alternative pathology - if ESR and CRP within normal parameters, likely stop colchicine as it is very expensive for him.  - will give tramadol prn with 1 refill.   F/u 1 month   [name], M.D.

## 2015-12-27 NOTE — Patient Instructions (Signed)
Your physician recommends that you schedule a follow-up appointment in: 1 month with Dr. Harl Bowie  Your physician recommends that you continue on your current medications as directed. Please refer to the Current Medication list given to you today.  WE HAVE GIVEN YOU TRAMADOL 50 MG EVERY 8 HOURS AS NEEDED - NO REFILLS ON THIS MEDICATION WILL BE GIVEN   Your physician recommends that you return for lab work ESR/CRP/TROPONIN   Non-Cardiac CT scanning, (CAT scanning), is a noninvasive, special x-ray that produces cross-sectional images of the body using x-rays and a computer. CT scans help physicians diagnose and treat medical conditions. For some CT exams, a contrast material is used to enhance visibility in the area of the body being studied. CT scans provide greater clarity and reveal more details than regular x-ray exams.  Thank you for choosing [facility]!!

## 2015-12-30 ENCOUNTER — Encounter: Payer: Self-pay | Admitting: *Deleted

## 2015-12-30 ENCOUNTER — Other Ambulatory Visit: Payer: Self-pay | Admitting: *Deleted

## 2015-12-30 DIAGNOSIS — R079 Chest pain, unspecified: Secondary | ICD-10-CM

## 2016-01-01 ENCOUNTER — Telehealth: Payer: Self-pay | Admitting: *Deleted

## 2016-01-01 NOTE — Telephone Encounter (Signed)
-----  Message from [name], MD sent at 12/30/2015  9:38 AM EST ----- Labs overall look good. His ESR and CRP, which are blood tests that suggests inflammation of the heart, are within norma limits suggesting pericarditis is not the ongoing issue. He can hold off on refilling his colchicine at this time, we will see what his CT shows. One liver test was just mildly elevated but not high enough to suggest an issue, we will repeat in a few weeks.   J BrancH MD

## 2016-01-01 NOTE — Telephone Encounter (Signed)
Pt aware, routed to pcp, pt will stop colchicine.

## 2016-01-01 NOTE — Telephone Encounter (Signed)
-----   Message from Antoine Poche, MD sent at 12/30/2015  1:54 PM EST ----- CT chest overall looks ok, he does have some gallstones present. His blood test showed a very mild elevation in a liver test, it unclear if a gallbladder problem could tie everything together as far as the stones, liver test, and pain. Please forward results to his primary care doctor, he needs to arrange follow up with pcp. It remains unclear what is causing his pain at this time. There is no evidence of ongoing pericarditis, ok to stop colchcine. Continue prn tramadol. I would recommend a trial of zantac  bid, sometimes severe illness can cause stress induced gastritis/ulcers. Keep scheduled follow up with [place].   Dominga Ferry MD

## 2016-01-01 NOTE — Telephone Encounter (Signed)
Pt aware, routed to pcp 

## 2016-01-31 ENCOUNTER — Ambulatory Visit: Payer: BLUE CROSS/BLUE SHIELD | Admitting: Cardiology

## 2016-03-19 ENCOUNTER — Encounter: Payer: Self-pay | Admitting: Cardiology

## 2016-03-19 ENCOUNTER — Ambulatory Visit (INDEPENDENT_AMBULATORY_CARE_PROVIDER_SITE_OTHER): Payer: BLUE CROSS/BLUE SHIELD | Admitting: Cardiology

## 2016-03-19 VITALS — BP 136/81 | HR 58 | Ht 71.0 in | Wt 262.8 lb

## 2016-03-19 DIAGNOSIS — R7309 Other abnormal glucose: Secondary | ICD-10-CM

## 2016-03-19 DIAGNOSIS — I2510 Atherosclerotic heart disease of native coronary artery without angina pectoris: Secondary | ICD-10-CM

## 2016-03-19 DIAGNOSIS — E785 Hyperlipidemia, unspecified: Secondary | ICD-10-CM | POA: Diagnosis not present

## 2016-03-19 NOTE — Progress Notes (Signed)
Patient ID: SENCERE SYMONETTE, male   DOB: 1971-05-31, 45 y.o.   MRN: [id number]     Clinical Summary Mr. Suarez is a 45 y.o.male seen today for follow up of the following medical problems.   1. CAD - admit 07/2015 with NSTEMI. Found to have totally occluded RCA, as well as significant LCX and OM2 disease. S/p DES to LCX and OM2, RCA occlusion managed medically. - 10/2015 echo LVEF 55-60%, no WMAs - treated for post-MI pericarditis with colchicine and ASA/NSAIDs for a period - last visit he continued to have atypical chest pain. Right sided, worst with position, worst with deep breathing. Constant pain lasting days at a time.  - since last visit repeat ESR and CRP have normalized. CT chest looked good, had some gallstones without cholelithiasis.Marland Kitchen ALT mildly elevated at 74, other liver tests normal.   - since last visit the patient had his gallbladder removed, his chest pain symptoms have completely have completely improved - compliant with meds  2. Hyperlipidemia - compliant with statin Past Medical History  Diagnosis Date  . Hypercholesteremia   . NSTEMI (non-ST elevated myocardial infarction) ([facility]) 07/28/2015  . Headache     "weekly" (07/29/2015)  . CAD (coronary artery disease)     cath 9/26 & 07/31/2015 100% total occlusion of post atrio Aison Malveaux of RCA, LV supplied by collaterals from the L atrial recurrent Maysen Bonsignore of distal LCx, 75-80% mid to distal LCx stenosis, high grade complex stenosis of OM2, patent LAD and RI, EF 55-60%. On 9/8, single DES to midLCx extending into OM2.     No Known Allergies   Current Outpatient Prescriptions  Medication Sig Dispense Refill  . aspirin 81 MG chewable tablet Chew 1 tablet (81 mg total) by mouth daily.    . clopidogrel (PLAVIX) 75 MG tablet Take 1 tablet (75 mg total) by mouth daily with breakfast. 90 tablet 3  . ibuprofen (ADVIL,MOTRIN) 400 MG tablet Take 1 tablet (400 mg total) by mouth 3 (three) times daily. 21 tablet 0  . lisinopril  (PRINIVIL,ZESTRIL) 2.5 MG tablet Take 1 tablet (2.5 mg total) by mouth daily. 90 tablet 3  . nitroGLYCERIN (NITROSTAT) 0.4 MG SL tablet Place 1 tablet (0.4 mg total) under the tongue every 5 (five) minutes as needed for chest pain. 25 tablet 3  . oxyCODONE-acetaminophen (ROXICET) 5-325 MG tablet Take 1-2 tablets by mouth every 6 (six) hours as needed for severe pain. 30 tablet 0  . rosuvastatin (CRESTOR) 10 MG tablet Take 10 mg by mouth daily.  6  . traMADol (ULTRAM) 50 MG tablet Take 1 tablet (50 mg total) by mouth every 6 (six) hours as needed. 30 tablet 0   No current facility-administered medications for this visit.     Past Surgical History  Procedure Laterality Date  . Wisdom tooth extraction  1993  . Tonsillectomy  ~ 1989  . Cardiac catheterization  2006; 07/29/2015  . Cardiac catheterization N/A 07/29/2015    Procedure: Left Heart Cath and Coronary Angiography;  Surgeon: Belva Crome, MD;  Location: [facility];  Service: Cardiovascular;  Laterality: N/A;  . Cardiac catheterization N/A 07/31/2015    Procedure: Coronary Stent Intervention;  Surgeon: Wellington Hampshire, MD;  Location: [facility];  Service: Cardiovascular;  Laterality: N/A;  . Cardiac catheterization N/A 07/31/2015    Procedure: Left Heart Cath and Coronary Angiography;  Surgeon: Wellington Hampshire, MD;  Location: [facility];  Service: Cardiovascular;  Laterality: N/A;  No Known Allergies    No family history on file.   Social History Mr. Dills reports that he quit smoking about 19 years ago. His smoking use included Cigarettes. He has a 5 pack-year smoking history. He has quit using smokeless tobacco. Mr. Gebhard reports that he drinks alcohol.   Review of Systems CONSTITUTIONAL: No weight loss, fever, chills, weakness or fatigue.  HEENT: Eyes: No visual loss, blurred vision, double vision or yellow sclerae.No hearing loss, sneezing, congestion, runny nose or sore throat.  SKIN: No rash  or itching.  CARDIOVASCULAR: per HPI RESPIRATORY: No shortness of breath, cough or sputum.  GASTROINTESTINAL: No anorexia, nausea, vomiting or diarrhea. No abdominal pain or blood.  GENITOURINARY: No burning on urination, no polyuria NEUROLOGICAL: No headache, dizziness, syncope, paralysis, ataxia, numbness or tingling in the extremities. No change in bowel or bladder control.  MUSCULOSKELETAL: No muscle, back pain, joint pain or stiffness.  LYMPHATICS: No enlarged nodes. No history of splenectomy.  PSYCHIATRIC: No history of depression or anxiety.  ENDOCRINOLOGIC: No reports of sweating, cold or heat intolerance. No polyuria or polydipsia.  Marland Kitchen   Physical Examination Filed Vitals:   03/19/16 1522  BP: 136/81  Pulse: 58   Filed Vitals:   03/19/16 1522  Height: '5\' 11"'  (1.803 m)  Weight: 262 lb 12.8 oz (119.205 kg)    Gen: resting comfortably, no acute distress HEENT: no scleral icterus, pupils equal round and reactive, no palptable cervical adenopathy,  CV: RRR, no m/r/g, no jvd Resp: Clear to auscultation bilaterally GI: abdomen is soft, non-tender, non-distended, normal bowel sounds, no hepatosplenomegaly MSK: extremities are warm, no edema.  Skin: warm, no rash Neuro:  no focal deficits Psych: appropriate affect   Diagnostic Studies 07/2015 cath  Ost 2nd Mrg to 2nd Mrg lesion, 85% stenosed.  Prox Cx lesion, 75% stenosed.  Post Atrio lesion, 100% stenosed.  Ost LAD to Prox LAD lesion, 30% stenosed.  Mid RCA to Dist RCA lesion, 40% stenosed.   Recent acute inferolateral infarction now greater than 18 hours old. Angiography demonstrates total occlusion of the RCA beyond the origin of the PDA. The left ventricular Roshan Salamon is supplied by collaterals from the left atrial recurrent Zaliah Wissner of the distal circumflex.  75-80% mid to distal circumflex stenosis. High-grade complex stenosis in the second obtuse marginal Kelcee Bjorn which is small to moderate in size.  Widely patent  LAD and a dominant branching ramus intermedius.  Overall normal LV function with EF 55-60%, and inferobasal hypokinesis.    Assessment and Plan  1. CAD - no recent symptoms - he will stop his plavix 07/30/16, complete one year of DAPT - continue other meds  2. Hyperlipidemia - we will repeat lipid panel   F/u 6 months.       [name], M.D.

## 2016-03-19 NOTE — Patient Instructions (Signed)
Your physician wants you to follow-up in: 6 MONTHS WITH DR. BRANCH You will receive a reminder letter in the mail two months in advance. If you don't receive a letter, please call our office to schedule the follow-up appointment.  Your physician has recommended you make the following change in your medication:   STOP PLAVIX July 30 2016  Your physician recommends that you return for lab work - PLEASE FAST PRIOR TO LAB WORK CBC/CMP/HGBA1C/LIPIDS  Thank you for choosing [facility]!!

## 2016-04-30 ENCOUNTER — Encounter: Payer: Self-pay | Admitting: *Deleted

## 2016-08-18 ENCOUNTER — Other Ambulatory Visit: Payer: Self-pay | Admitting: Cardiology

## 2016-08-18 ENCOUNTER — Telehealth: Payer: Self-pay | Admitting: Cardiology

## 2016-08-18 MED ORDER — LISINOPRIL 2.5 MG PO TABS: 2.5000 mg | ORAL_TABLET | Freq: Every day | ORAL | 3 refills | Status: DC

## 2016-08-18 NOTE — Telephone Encounter (Signed)
°*  STAT* If patient is at the pharmacy, call can be transferred to refill team.   1. Which medications need to be refilled? (please list name of each medication and dose if known) Patient's wife states that she does not know the name of the medicine. But it is "his BP medicine".  2. Which pharmacy/location (including street and city if local pharmacy) is medication to be sent to?[facility]  3. Do they need a 30 day or 90 day supply? 30 day supply

## 2016-08-18 NOTE — Telephone Encounter (Signed)
Medication sent to pharmacy  

## 2016-08-18 NOTE — Telephone Encounter (Signed)
Pt is needing a refill on his  lisinopril (PRINIVIL,ZESTRIL) 2.5 MG tablet [[id number]][[id number]]

## 2016-08-18 NOTE — Telephone Encounter (Signed)
[facility] pt, routed accordingly

## 2016-09-23 ENCOUNTER — Ambulatory Visit: Payer: BLUE CROSS/BLUE SHIELD | Admitting: Cardiology

## 2016-10-23 ENCOUNTER — Telehealth: Payer: Self-pay | Admitting: Cardiology

## 2016-10-23 NOTE — Telephone Encounter (Signed)
Will route to provider if ok to take antibiotics from cardiac standpoint - defer tooth ache to dentist or pcp

## 2016-10-23 NOTE — Telephone Encounter (Signed)
Pt aware.

## 2016-10-23 NOTE — Telephone Encounter (Signed)
Tooth ache would need to be evaluated by dentist. There is no limitations from a heart standpoint on there possible treatment. Did he stop his plavix in 07/2016. He they needed to do some dental work he would need to be off that. We had talked about stopping in 07/2016 at our last visit   Dominga FerryJ Branch MD

## 2016-10-23 NOTE — Telephone Encounter (Signed)
Pt's wife called and lvm stating the pt has a severe toothache-she's a little concerned and is wanting to know if he should be on antibiotics and if they'd be ok to take w/ all of his current heart meds, wife stated he had heart attack in 2016--please give her a call @ [phone]

## 2016-11-10 ENCOUNTER — Ambulatory Visit: Payer: BLUE CROSS/BLUE SHIELD | Admitting: Cardiology

## 2016-12-30 ENCOUNTER — Ambulatory Visit: Payer: BLUE CROSS/BLUE SHIELD | Admitting: Cardiology

## 2017-01-18 ENCOUNTER — Telehealth: Payer: Self-pay | Admitting: *Deleted

## 2017-01-18 MED ORDER — ROSUVASTATIN CALCIUM 20 MG PO TABS: 20.0000 mg | ORAL_TABLET | Freq: Every day | ORAL | 6 refills | Status: DC

## 2017-01-18 NOTE — Telephone Encounter (Signed)
Notes recorded by Lesle ChrisAngela G Hill, LPN on 0/45/40983/19/2018 at 5:01 PM EDT Patient notified. Copy to pmd. New 20mg  Crestor [facility]. Follow up already scheduled for 01/22/2017 with Dr. Wyline MoodBranch. ------  Notes recorded by Albertine PatriciaStaci T Ashworth, CMA on 01/18/2017 at 4:40 PM EDT LM to return call ------  Notes recorded by Antoine PocheJonathan F Branch, MD on 01/18/2017 at 3:14 PM EDT Labs show cholesterol too high. Verify he is takign crestor 10mg  and increase to 20mg . Labs also show prediabetes, he needs to work on exercise and weight loss to avoid to progressing to full blow diabetes. Please forward to pcp

## 2017-01-22 ENCOUNTER — Ambulatory Visit: Payer: BLUE CROSS/BLUE SHIELD | Admitting: Cardiology

## 2017-02-03 ENCOUNTER — Telehealth: Payer: Self-pay | Admitting: Cardiology

## 2017-02-03 ENCOUNTER — Ambulatory Visit: Payer: BLUE CROSS/BLUE SHIELD | Admitting: Cardiology

## 2017-02-03 MED ORDER — LISINOPRIL 2.5 MG PO TABS: 2.5000 mg | ORAL_TABLET | Freq: Every day | ORAL | 0 refills | Status: DC

## 2017-02-03 MED ORDER — ROSUVASTATIN CALCIUM 20 MG PO TABS
20.0000 mg | ORAL_TABLET | Freq: Every day | ORAL | 0 refills | Status: DC
Start: 2017-02-03 — End: 2017-03-02

## 2017-02-03 NOTE — Telephone Encounter (Signed)
[facility] [place] Spoke with patients wife and told her we would not be able to refill protonix due to Dr. Wyline Mood not prescribing it and it not being on his medication list when he was seen last. Advised wife to contact his PCP for refill. Patients wife stated they would make his follow up appointment as soon as they returned.

## 2017-02-03 NOTE — Telephone Encounter (Signed)
Mr. Mercier is out of town due to his father passing. He is running out of  Lisinopril 2.5 and Crestor 20 mg, Protonix. [facility] [place] [facility]phone [facility] ) please call patient if any questions   [phone].

## 2017-02-03 NOTE — Progress Notes (Deleted)
Clinical Summary Mr. Fullam is a 46 y.o.male  1. CAD - admit 07/2015 with NSTEMI. Found to have totally occluded RCA, as well as significant LCX and OM2 disease. S/p DES to LCX and OM2, RCA occlusion managed medically. - 10/2015 echo LVEF 55-60%, no WMAs - treated for post-MI pericarditis with colchicine and ASA/NSAIDs for a period - last visit he continued to have atypical chest pain. Right sided, worst with position, worst with deep breathing. Constant pain lasting days at a time.  - since last visit repeat ESR and CRP have normalized. CT chest looked good, had some gallstones without cholelithiasis.Marland Kitchen ALT mildly elevated at 74, other liver tests normal.   - since last visit the patient had his gallbladder removed, his chest pain symptoms have completely have completely improved - compliant with meds  2. Hyperlipidemia - compliant with statin  - since last visit we increased his crestor to 72m daily.   3. Prediabetes Past Medical History:  Diagnosis Date  . CAD (coronary artery disease)    cath 9/26 & 07/31/2015 100% total occlusion of post atrio Rondall Radigan of RCA, LV supplied by collaterals from the L atrial recurrent Ewan Grau of distal LCx, 75-80% mid to distal LCx stenosis, high grade complex stenosis of OM2, patent LAD and RI, EF 55-60%. On 9/8, single DES to midLCx extending into OM2.  . Headache    "weekly" (07/29/2015)  . Hypercholesteremia   . NSTEMI (non-ST elevated myocardial infarction) ([facility] 07/28/2015     No Known Allergies   Current Outpatient Prescriptions  Medication Sig Dispense Refill  . aspirin 81 MG chewable tablet Chew 1 tablet (81 mg total) by mouth daily.    . clopidogrel (PLAVIX) 75 MG tablet Take 1 tablet (75 mg total) by mouth daily with breakfast. 90 tablet 3  . lisinopril (PRINIVIL,ZESTRIL) 2.5 MG tablet Take 1 tablet (2.5 mg total) by mouth daily. 90 tablet 3  . nitroGLYCERIN (NITROSTAT) 0.4 MG SL tablet Place 1 tablet (0.4 mg total) under the  tongue every 5 (five) minutes as needed for chest pain. 25 tablet 3  . rosuvastatin (CRESTOR) 20 MG tablet Take 1 tablet (20 mg total) by mouth daily. 30 tablet 6   No current facility-administered medications for this visit.      Past Surgical History:  Procedure Laterality Date  . CARDIAC CATHETERIZATION  2006; 07/29/2015  . CARDIAC CATHETERIZATION N/A 07/29/2015   Procedure: Left Heart Cath and Coronary Angiography;  Surgeon: HBelva Crome MD;  [facility];  Service: Cardiovascular;  Laterality: N/A;  . CARDIAC CATHETERIZATION N/A 07/31/2015   Procedure: Coronary Stent Intervention;  Surgeon: MWellington Hampshire MD;  [facility];  Service: Cardiovascular;  Laterality: N/A;  . CARDIAC CATHETERIZATION N/A 07/31/2015   Procedure: Left Heart Cath and Coronary Angiography;  Surgeon: MWellington Hampshire MD;  [facility];  Service: Cardiovascular;  Laterality: N/A;  . TONSILLECTOMY  ~ 1989  . WISDOM TOOTH EXTRACTION  1993     No Known Allergies    No family history on file.   Social History Mr. LAldretereports that he quit smoking about 20 years ago. His smoking use included Cigarettes. He has a 5.00 pack-year smoking history. He has quit using smokeless tobacco. Mr. LKirkwoodreports that he drinks alcohol.   Review of Systems CONSTITUTIONAL: No weight loss, fever, chills, weakness or fatigue.  HEENT: Eyes: No visual loss, blurred vision, double vision or yellow sclerae.No hearing loss, sneezing,  congestion, runny nose or sore throat.  SKIN: No rash or itching.  CARDIOVASCULAR:  RESPIRATORY: No shortness of breath, cough or sputum.  GASTROINTESTINAL: No anorexia, nausea, vomiting or diarrhea. No abdominal pain or blood.  GENITOURINARY: No burning on urination, no polyuria NEUROLOGICAL: No headache, dizziness, syncope, paralysis, ataxia, numbness or tingling in the extremities. No change in bowel or bladder control.  MUSCULOSKELETAL: No  muscle, back pain, joint pain or stiffness.  LYMPHATICS: No enlarged nodes. No history of splenectomy.  PSYCHIATRIC: No history of depression or anxiety.  ENDOCRINOLOGIC: No reports of sweating, cold or heat intolerance. No polyuria or polydipsia.  Marland Kitchen   Physical Examination There were no vitals filed for this visit. There were no vitals filed for this visit.  Gen: resting comfortably, no acute distress HEENT: no scleral icterus, pupils equal round and reactive, no palptable cervical adenopathy,  CV Resp: Clear to auscultation bilaterally GI: abdomen is soft, non-tender, non-distended, normal bowel sounds, no hepatosplenomegaly MSK: extremities are warm, no edema.  Skin: warm, no rash Neuro:  no focal deficits Psych: appropriate affect   Diagnostic Studies 07/2015 cath  Ost 2nd Mrg to 2nd Mrg lesion, 85% stenosed.  Prox Cx lesion, 75% stenosed.  Post Atrio lesion, 100% stenosed.  Ost LAD to Prox LAD lesion, 30% stenosed.  Mid RCA to Dist RCA lesion, 40% stenosed.   Recent acute inferolateral infarction now greater than 18 hours old. Angiography demonstrates total occlusion of the RCA beyond the origin of the PDA. The left ventricular Berdella Bacot is supplied by collaterals from the left atrial recurrent Tadeo Besecker of the distal circumflex.  75-80% mid to distal circumflex stenosis. High-grade complex stenosis in the second obtuse marginal Cherissa Hook which is small to moderate in size.  Widely patent LAD and a dominant branching ramus intermedius.  Overall normal LV function with EF 55-60%, and inferobasal hypokinesis.     Assessment and Plan  1. CAD - no recent symptoms - he will stop his plavix 07/30/16, complete one year of DAPT - continue other meds  2. Hyperlipidemia - we will repeat lipid panel   F/u 6 months.       [name], M.D., F.A.C.C.

## 2017-03-02 ENCOUNTER — Other Ambulatory Visit: Payer: Self-pay | Admitting: Cardiology

## 2017-03-02 MED ORDER — ROSUVASTATIN CALCIUM 20 MG PO TABS: 20.0000 mg | ORAL_TABLET | Freq: Every day | ORAL | 0 refills | Status: DC

## 2017-03-02 MED ORDER — LISINOPRIL 2.5 MG PO TABS: 2.5000 mg | ORAL_TABLET | Freq: Every day | ORAL | 0 refills | Status: DC

## 2017-03-05 ENCOUNTER — Ambulatory Visit: Payer: BLUE CROSS/BLUE SHIELD | Admitting: Cardiology

## 2017-03-08 ENCOUNTER — Ambulatory Visit: Payer: BLUE CROSS/BLUE SHIELD | Admitting: Cardiology

## 2017-03-23 ENCOUNTER — Ambulatory Visit (INDEPENDENT_AMBULATORY_CARE_PROVIDER_SITE_OTHER): Payer: BLUE CROSS/BLUE SHIELD | Admitting: Cardiology

## 2017-03-23 ENCOUNTER — Encounter: Payer: Self-pay | Admitting: Cardiology

## 2017-03-23 VITALS — BP 118/84 | HR 72 | Ht 71.0 in | Wt 279.0 lb

## 2017-03-23 DIAGNOSIS — E782 Mixed hyperlipidemia: Secondary | ICD-10-CM

## 2017-03-23 DIAGNOSIS — I25119 Atherosclerotic heart disease of native coronary artery with unspecified angina pectoris: Secondary | ICD-10-CM | POA: Diagnosis not present

## 2017-03-23 MED ORDER — LISINOPRIL 2.5 MG PO TABS: 2.5000 mg | ORAL_TABLET | Freq: Every day | ORAL | 11 refills | Status: DC

## 2017-03-23 MED ORDER — ROSUVASTATIN CALCIUM 20 MG PO TABS: 20.0000 mg | ORAL_TABLET | Freq: Every day | ORAL | 11 refills | Status: DC

## 2017-03-23 MED ORDER — NITROGLYCERIN 0.4 MG SL SUBL: 0.4000 mg | SUBLINGUAL_TABLET | SUBLINGUAL | 3 refills | Status: DC | PRN

## 2017-03-23 NOTE — Patient Instructions (Signed)
Medication Instructions:  I HAVE REFILLED ALL CARDIAC MEDICATIONS   Labwork: Your physician recommends that you return for lab work in: 1 MONTH  FASTING LIPID   Testing/Procedures: Your physician has requested that you have en exercise stress myoview. For further information please visit [email or web address]. Please follow instruction sheet, as given.    Follow-Up: Your physician recommends that you schedule a follow-up appointment in: TO BE DETERMINED BASED ON TEST   Any Other Special Instructions Will Be Listed Below (If Applicable).     If you need a refill on your cardiac medications before your next appointment, please call your pharmacy.

## 2017-03-23 NOTE — Progress Notes (Signed)
Clinical Summary Alex Barnes is a 46 y.o.male seen today for follow up of the following medical problems.   1. CAD - admit 07/2015 with NSTEMI. Found to have totally occluded RCA, as well as significant LCX and OM2 disease. S/p DES to LCX and OM2, RCA occlusion managed medically. - 10/2015 echo LVEF 55-60%, no WMAs - treated for post-MI pericarditis with colchicine and ASA/NSAIDs for a period - last visit he continued to have atypical chest pain. Right sided, worst with position, worst with deep breathing. Constant pain lasting days at a time.  - since last visit repeat ESR and CRP have normalized. CT chest looked good, had some gallstones without cholelithiasis.Marland Kitchen ALT mildly elevated at 74, other liver tests normal.  -had his gallbladder removed, his chest pain symptoms resolved at that time   - has had some recent chest pain. With high levels of exertion has chest pain. Pressure in midchest, radiates. Can get diaphoretic. Not better with NG. Can linger for hours after onset. Can occur at normal walking pace - started late last year. Stable frequency, stable severity.     2. Hyperlipidemia - compliant with statin - we recently increased his crestor from 25m to 269m      Past Medical History:  Diagnosis Date  . CAD (coronary artery disease)    cath 9/26 & 07/31/2015 100% total occlusion of post atrio Alex Barnes of RCA, LV supplied by collaterals from the L atrial recurrent Alex Barnes of distal LCx, 75-80% mid to distal LCx stenosis, high grade complex stenosis of OM2, patent LAD and RI, EF 55-60%. On 9/8, single DES to midLCx extending into OM2.  . Headache    "weekly" (07/29/2015)  . Hypercholesteremia   . NSTEMI (non-ST elevated myocardial infarction) ([facility]/[place]     No Known Allergies   Current Outpatient Prescriptions  Medication Sig Dispense Refill  . aspirin 81 MG chewable tablet Chew 1 tablet (81 mg total) by mouth daily.    . Marland Kitchenisinopril (PRINIVIL,ZESTRIL) 2.5 MG  tablet Take 1 tablet (2.5 mg total) by mouth daily. 30 tablet 0  . nitroGLYCERIN (NITROSTAT) 0.4 MG SL tablet Place 1 tablet (0.4 mg total) under the tongue every 5 (five) minutes as needed for chest pain. 25 tablet 3  . rosuvastatin (CRESTOR) 20 MG tablet Take 1 tablet (20 mg total) by mouth daily. 30 tablet 0   No current facility-administered medications for this visit.      Past Surgical History:  Procedure Laterality Date  . CARDIAC CATHETERIZATION  2006; 07/29/2015  . CARDIAC CATHETERIZATION N/A 07/29/2015   Procedure: Left Heart Cath and Coronary Angiography;  Surgeon: Alex Barnes;  [facility];  Service: Cardiovascular;  Laterality: N/A;  . CARDIAC CATHETERIZATION N/A 07/31/2015   Procedure: Coronary Stent Intervention;  Surgeon: Alex Barnes;  [facility];  Service: Cardiovascular;  Laterality: N/A;  . CARDIAC CATHETERIZATION N/A 07/31/2015   Procedure: Left Heart Cath and Coronary Angiography;  Surgeon: Alex Barnes;  [facility];  Service: Cardiovascular;  Laterality: N/A;  . TONSILLECTOMY  ~ 1989  . WISDOM TOOTH EXTRACTION  1993     No Known Allergies    Family History  Problem Relation Age of Onset  . Cancer Father      Social History Alex Barnes that he quit smoking about 20 years ago. His smoking use included Cigarettes. He has a 5.00 pack-year smoking history. He has quit  using smokeless tobacco. Alex Barnes reports that he drinks alcohol.   Review of Systems CONSTITUTIONAL: No weight loss, fever, chills, weakness or fatigue.  HEENT: Eyes: No visual loss, blurred vision, double vision or yellow sclerae.No hearing loss, sneezing, congestion, runny nose or sore throat.  SKIN: No rash or itching.  CARDIOVASCULAR: per hpi RESPIRATORY: No shortness of breath, cough or sputum.  GASTROINTESTINAL: No anorexia, nausea, vomiting or diarrhea. No abdominal pain or blood.  GENITOURINARY: No burning  on urination, no polyuria NEUROLOGICAL: No headache, dizziness, syncope, paralysis, ataxia, numbness or tingling in the extremities. No change in bowel or bladder control.  MUSCULOSKELETAL: No muscle, back pain, joint pain or stiffness.  LYMPHATICS: No enlarged nodes. No history of splenectomy.  PSYCHIATRIC: No history of depression or anxiety.  ENDOCRINOLOGIC: No reports of sweating, cold or heat intolerance. No polyuria or polydipsia.  Marland Kitchen   Physical Examination Vitals:   03/23/17 1104  BP: 118/84  Pulse: 72   Filed Weights   03/23/17 1104  Weight: 279 lb (126.6 kg)    Gen: resting comfortably, no acute distress HEENT: no scleral icterus, pupils equal round and reactive, no palptable cervical adenopathy,  CV: RRR, no m/r/g, no jvd Resp: Clear to auscultation bilaterally GI: abdomen is soft, non-tender, non-distended, normal bowel sounds, no hepatosplenomegaly MSK: extremities are warm, no edema.  Skin: warm, no rash Neuro:  no focal deficits Psych: appropriate affect   Diagnostic Studies 07/2015 cath  Ost 2nd Mrg to 2nd Mrg lesion, 85% stenosed.  Prox Cx lesion, 75% stenosed.  Post Atrio lesion, 100% stenosed.  Ost LAD to Prox LAD lesion, 30% stenosed.  Mid RCA to Dist RCA lesion, 40% stenosed.   Recent acute inferolateral infarction now greater than 18 hours old. Angiography demonstrates total occlusion of the RCA beyond the origin of the PDA. The left ventricular Alex Barnes is supplied by collaterals from the left atrial recurrent Alex Barnes of the distal circumflex.  75-80% mid to distal circumflex stenosis. High-grade complex stenosis in the second obtuse marginal Alex Barnes which is small to moderate in size.  Widely patent LAD and a dominant branching ramus intermedius.  Overall normal LV function with EF 55-60%, and inferobasal hypokinesis.      Assessment and Plan   1. CAD -recent exertional chest pain. EKG in clinic SR, no acute ischemic changes - we will  refer for exercise nuclear stress test to further evaluate   2. Hyperlipidemia - repeat lipid panel next month   F/u pending test results     Alex Barnes, M.D.

## 2017-03-30 ENCOUNTER — Other Ambulatory Visit: Payer: Self-pay | Admitting: *Deleted

## 2017-03-30 MED ORDER — ROSUVASTATIN CALCIUM 20 MG PO TABS: 20.0000 mg | ORAL_TABLET | Freq: Every day | ORAL | 1 refills | Status: DC

## 2017-03-30 MED ORDER — LISINOPRIL 2.5 MG PO TABS: 2.5000 mg | ORAL_TABLET | Freq: Every day | ORAL | 1 refills | Status: DC

## 2017-03-31 ENCOUNTER — Encounter (HOSPITAL_BASED_OUTPATIENT_CLINIC_OR_DEPARTMENT_OTHER)
Admission: RE | Admit: 2017-03-31 | Discharge: 2017-03-31 | Disposition: A | Discharge status: Home / Self Care | Payer: BLUE CROSS/BLUE SHIELD | Source: Ambulatory Visit | Attending: Cardiology | Admitting: Cardiology

## 2017-03-31 ENCOUNTER — Encounter (HOSPITAL_COMMUNITY)
Admission: RE | Admit: 2017-03-31 | Discharge: 2017-03-31 | Disposition: A | Discharge status: Home / Self Care | Payer: BLUE CROSS/BLUE SHIELD | Source: Ambulatory Visit | Attending: Cardiology | Admitting: Cardiology

## 2017-03-31 ENCOUNTER — Ambulatory Visit (HOSPITAL_COMMUNITY): Payer: BLUE CROSS/BLUE SHIELD

## 2017-03-31 DIAGNOSIS — I25119 Atherosclerotic heart disease of native coronary artery with unspecified angina pectoris: Secondary | ICD-10-CM | POA: Insufficient documentation

## 2017-03-31 MED ORDER — REGADENOSON 0.4 MG/5ML IV SOLN
INTRAVENOUS | Status: AC
  Filled 2017-03-31: qty 5

## 2017-03-31 MED ORDER — TECHNETIUM TC 99M TETROFOSMIN IV KIT
30.0000 | PACK | Freq: Once | INTRAVENOUS | Status: AC | PRN
  Administered 2017-03-31: 30 via INTRAVENOUS

## 2017-03-31 MED ORDER — SODIUM CHLORIDE 0.9% FLUSH
INTRAVENOUS | Status: AC
  Filled 2017-03-31: qty 180

## 2017-03-31 MED ORDER — SODIUM CHLORIDE 0.9% FLUSH
INTRAVENOUS | Status: AC
  Administered 2017-03-31: 10 mL via INTRAVENOUS
  Filled 2017-03-31: qty 10

## 2017-04-01 ENCOUNTER — Encounter (HOSPITAL_COMMUNITY)
Admission: RE | Admit: 2017-04-01 | Discharge: 2017-04-01 | Disposition: A | Discharge status: Home / Self Care | Payer: BLUE CROSS/BLUE SHIELD | Source: Ambulatory Visit | Attending: Cardiology | Admitting: Cardiology

## 2017-04-01 ENCOUNTER — Telehealth: Payer: Self-pay | Admitting: *Deleted

## 2017-04-01 ENCOUNTER — Encounter (HOSPITAL_COMMUNITY): Payer: Self-pay

## 2017-04-01 DIAGNOSIS — I25119 Atherosclerotic heart disease of native coronary artery with unspecified angina pectoris: Secondary | ICD-10-CM | POA: Diagnosis not present

## 2017-04-01 LAB — NM MYOCAR MULTI W/SPECT W/WALL MOTION / EF
CHL CUP NUCLEAR SDS: 2
CHL CUP NUCLEAR SRS: 5
CSEPED: 9 min
CSEPEDS: 1 s
CSEPHR: 91 %
CSEPPHR: 162 {beats}/min
Estimated workload: 10.4 METS
LHR: 0.39
LV sys vol: 53 mL
LVDIAVOL: 109 mL (ref 62–150)
MPHR: 177 {beats}/min
RPE: 18
Rest HR: 62 {beats}/min
SSS: 7
TID: 0.86

## 2017-04-01 MED ORDER — TECHNETIUM TC 99M TETROFOSMIN IV KIT
25.0000 | PACK | Freq: Once | INTRAVENOUS | Status: AC | PRN
  Administered 2017-04-01: 26.1 via INTRAVENOUS

## 2017-04-01 MED ORDER — ISOSORBIDE MONONITRATE ER 30 MG PO TB24: 15.0000 mg | ORAL_TABLET | Freq: Every day | ORAL | 1 refills | Status: DC

## 2017-04-01 NOTE — Telephone Encounter (Signed)
-----   Message from Antoine PocheJonathan F Branch, MD sent at 04/01/2017  3:39 PM EDT ----- Stress test overall looks good, the combination of the findings is low risk. There is a very small area that is abnormal that suggests a small blockage in the are of his previous heart attack. This is not big enough to be of any risk, but could cause some pain at times. I would like to try starting him on imdur 1362m daily and follow symptoms. If headaches or dizziness as side effects let us know, but this is a very low dose and less likely.  F/u in 6 weeks  Dominga FerryJ Branch MD

## 2017-04-01 NOTE — Telephone Encounter (Signed)
Pt wife Carollee Herter(Shannon DPR) made aware and voiced understanding - Imdur 15 mg sent to pharmacy and f/u appt scheduled - routed to pcp

## 2017-05-17 ENCOUNTER — Telehealth: Payer: Self-pay | Admitting: *Deleted

## 2017-05-17 NOTE — Telephone Encounter (Signed)
-----   Message from Antoine PocheJonathan F Branch, MD sent at 05/17/2017 10:50 AM EDT ----- Cholesterol is improving, would still like to get lower to best protect him against furture blockages. Please increase crestor to 40mg  daily   Dominga FerryJ Branch MD

## 2017-05-17 NOTE — Telephone Encounter (Signed)
Pt aware - says he has enough Crestor to take 40 mg daily until upcoming appt - routed to pcp - updated medication list

## 2017-05-19 ENCOUNTER — Ambulatory Visit (INDEPENDENT_AMBULATORY_CARE_PROVIDER_SITE_OTHER): Payer: BLUE CROSS/BLUE SHIELD | Admitting: Cardiology

## 2017-05-19 ENCOUNTER — Encounter: Payer: Self-pay | Admitting: Cardiology

## 2017-05-19 VITALS — BP 131/83 | HR 80 | Ht 71.0 in | Wt 281.0 lb

## 2017-05-19 DIAGNOSIS — E782 Mixed hyperlipidemia: Secondary | ICD-10-CM

## 2017-05-19 DIAGNOSIS — I25119 Atherosclerotic heart disease of native coronary artery with unspecified angina pectoris: Secondary | ICD-10-CM

## 2017-05-19 MED ORDER — METOPROLOL TARTRATE 25 MG PO TABS: 12.5000 mg | ORAL_TABLET | Freq: Two times a day (BID) | ORAL | 6 refills | Status: DC

## 2017-05-19 MED ORDER — ROSUVASTATIN CALCIUM 40 MG PO TABS: 40.0000 mg | ORAL_TABLET | Freq: Every day | ORAL | 6 refills | Status: DC

## 2017-05-19 NOTE — Patient Instructions (Addendum)
Medication Instructions:   Increase Crestor to 40mg  daily.  Begin Lopressor 12.5mg  twice a day .  Continue all other medications.    Labwork: none  Testing/Procedures: none  Follow-Up: 3 months   Any Other Special Instructions Will Be Listed Below (If Applicable).  If you need a refill on your cardiac medications before your next appointment, please call your pharmacy.

## 2017-05-19 NOTE — Progress Notes (Signed)
Clinical Summary Alex Barnes is a 46 y.o.male seen today for follow up of the following medical problems.   1. CAD - admit 07/2015 with NSTEMI. Found to have totally occluded RCA, as well as significant LCX and OM2 disease. S/p DES to LCX and OM2, RCA occlusion managed medically. - 10/2015 echo LVEF 55-60%, no WMAs - treated for post-MI pericarditis with colchicine and ASA/NSAIDs for a period - last visit he continued to have atypical chest pain. Right sided, worst with position, worst with deep breathing. Constant pain lasting days at a time.  - since last visit repeat ESR and CRP have normalized. CT chest looked good, had some gallstones without cholelithiasis.Marland Kitchen ALT mildly elevated at 74, other liver tests normal.  -had his gallbladder removed, his chest pain symptoms resolved at that time   - has had some recent chest pain. With high levels of exertion has chest pain. Pressure in midchest, radiates. Can get diaphoretic. Not better with NG. Can linger for hours after onset. Can occur at normal walking pace - started late last year. Stable frequency, stable severity.     03/2017 nuclear stress: duke treadmill scor eof 9, inferior scar with very mild peri-infarct ischemia, low risk  - since last visit we started imdur '15mg'$  daily.  - still with chest pain at times, unchanged. Tried imdur caused headaches and he stopped taking.  -   2. Hyperlipidemia - compliant with statin - 05/2017 lipids: TC 158 TG 95 HDL 35 LDL 105 - we increased crestor to '40mg'$  daily just a few days ago    Past Medical History:  Diagnosis Date  . CAD (coronary artery disease)    cath 9/26 & 07/31/2015 100% total occlusion of post atrio Alex Barnes of RCA, LV supplied by collaterals from the L atrial recurrent Alex Barnes of distal LCx, 75-80% mid to distal LCx stenosis, high grade complex stenosis of OM2, patent LAD and RI, EF 55-60%. On 9/8, single DES to midLCx extending into OM2.  . Headache    "weekly"  (07/29/2015)  . Hypercholesteremia   . NSTEMI (non-ST elevated myocardial infarction) (Alex Barnes) 07/28/2015     No Known Allergies   Current Outpatient Prescriptions  Medication Sig Dispense Refill  . aspirin 81 MG chewable tablet Chew 1 tablet (81 mg total) by mouth daily.    . isosorbide mononitrate (IMDUR) 30 MG 24 hr tablet Take 0.5 tablets (15 mg total) by mouth daily. 45 tablet 1  . lisinopril (PRINIVIL,ZESTRIL) 2.5 MG tablet Take 1 tablet (2.5 mg total) by mouth daily. 90 tablet 1  . nitroGLYCERIN (NITROSTAT) 0.4 MG SL tablet Place 1 tablet (0.4 mg total) under the tongue every 5 (five) minutes as needed for chest pain. 25 tablet 3  . rosuvastatin (CRESTOR) 40 MG tablet Take 40 mg by mouth daily.     No current facility-administered medications for this visit.      Past Surgical History:  Procedure Laterality Date  . CARDIAC CATHETERIZATION  2006; 07/29/2015  . CARDIAC CATHETERIZATION N/A 07/29/2015   Procedure: Left Heart Cath and Coronary Angiography;  Surgeon: Belva Crome, MD;  Location: [place] [facility];  Service: Cardiovascular;  Laterality: N/A;  . CARDIAC CATHETERIZATION N/A 07/31/2015   Procedure: Coronary Stent Intervention;  Surgeon: Wellington Hampshire, MD;  Location: [place] [facility];  Service: Cardiovascular;  Laterality: N/A;  . CARDIAC CATHETERIZATION N/A 07/31/2015   Procedure: Left Heart Cath and Coronary Angiography;  Surgeon: Wellington Hampshire, MD;  Location: [facility];  Service: Cardiovascular;  Laterality: N/A;  . TONSILLECTOMY  ~ 1989  . WISDOM TOOTH EXTRACTION  1993     No Known Allergies    Family History  Problem Relation Age of Onset  . Cancer Father      Social History Alex Barnes reports that he quit smoking about 20 years ago. His smoking use included Cigarettes. He has a 5.00 pack-year smoking history. He has quit using smokeless tobacco. Alex Barnes reports that he drinks alcohol.   Review of Systems CONSTITUTIONAL: No weight loss,  fever, chills, weakness or fatigue.  HEENT: Eyes: No visual loss, blurred vision, double vision or yellow sclerae.No hearing loss, sneezing, congestion, runny nose or sore throat.  SKIN: No rash or itching.  CARDIOVASCULAR: per hpi RESPIRATORY: No shortness of breath, cough or sputum.  GASTROINTESTINAL: No anorexia, nausea, vomiting or diarrhea. No abdominal pain or blood.  GENITOURINARY: No burning on urination, no polyuria NEUROLOGICAL: No headache, dizziness, syncope, paralysis, ataxia, numbness or tingling in the extremities. No change in bowel or bladder control.  MUSCULOSKELETAL: No muscle, back pain, joint pain or stiffness.  LYMPHATICS: No enlarged nodes. No history of splenectomy.  PSYCHIATRIC: No history of depression or anxiety.  ENDOCRINOLOGIC: No reports of sweating, cold or heat intolerance. No polyuria or polydipsia.  Marland Kitchen   Physical Examination Vitals:   05/19/17 1451  BP: 131/83  Pulse: 80   Vitals:   05/19/17 1451  Weight: 281 lb (127.5 kg)  Height: '5\' 11"'$  (1.803 m)    Gen: resting comfortably, no acute distress HEENT: no scleral icterus, pupils equal round and reactive, no palptable cervical adenopathy,  CV: Alex Barnes, no m/r/g, no jvd Resp: Clear to auscultation bilaterally GI: abdomen is soft, non-tender, non-distended, normal bowel sounds, no hepatosplenomegaly MSK: extremities are warm, no edema.  Skin: warm, no rash Neuro:  no focal deficits Psych: appropriate affect   Diagnostic Studies 07/2015 cath  Ost 2nd Alex Barnes to 2nd Alex Barnes lesion, 85% stenosed.  Prox Cx lesion, 75% stenosed.  Post Atrio lesion, 100% stenosed.  Ost LAD to Prox LAD lesion, 30% stenosed.  Mid RCA to Dist RCA lesion, 40% stenosed.   Recent acute inferolateral infarction now greater than 18 hours old. Angiography demonstrates total occlusion of the RCA beyond the origin of the PDA. The left ventricular Alex Barnes is supplied by collaterals from the left atrial recurrent Alex Barnes of the distal  circumflex.  75-80% mid to distal circumflex stenosis. High-grade complex stenosis in the second obtuse marginal Alex Barnes which is small to moderate in size.  Widely patent LAD and a dominant branching ramus intermedius.  Overall normal LV function with EF 55-60%, and inferobasal hypokinesis.   03/2017 nuclear stress  Blood pressure demonstrated a normal response to exercise. [facility] treadmill score of 9 consistent with low risk for major cardiac events  There was no ST segment deviation noted during stress.  Findings consistent with prior inferior myocardial infarction with very mild peri-infarct ischemia.  This is a low risk study. Particularly when considering good functional capacity and low risk Duke treadmill score.  The left ventricular ejection fraction is normal (55-65%).  Assessment and Plan  1. CAD -recent chest pain. Nuclear stress test overall low risk - did not tolerate imdur, we will try lopressor 12.'5mg'$  bid. Previous low heart rates have resolved, will try beta blocker - if ongoing symptoms, consider trial of antacid.   2. Hyperlipidemia - lipids above goal, increase crestor to '40mg'$  daily   F/u 3 months  [name], M.D.

## 2017-07-04 ENCOUNTER — Emergency Department (HOSPITAL_COMMUNITY): Payer: BLUE CROSS/BLUE SHIELD

## 2017-07-04 ENCOUNTER — Encounter (HOSPITAL_COMMUNITY): Payer: Self-pay | Admitting: *Deleted

## 2017-07-04 ENCOUNTER — Emergency Department (HOSPITAL_COMMUNITY)
Admission: EM | Admit: 2017-07-04 | Discharge: 2017-07-04 | Disposition: A | Discharge status: Home / Self Care | Payer: BLUE CROSS/BLUE SHIELD | Attending: Emergency Medicine | Admitting: Emergency Medicine

## 2017-07-04 DIAGNOSIS — I2510 Atherosclerotic heart disease of native coronary artery without angina pectoris: Secondary | ICD-10-CM | POA: Diagnosis not present

## 2017-07-04 DIAGNOSIS — Z955 Presence of coronary angioplasty implant and graft: Secondary | ICD-10-CM | POA: Insufficient documentation

## 2017-07-04 DIAGNOSIS — Z7982 Long term (current) use of aspirin: Secondary | ICD-10-CM | POA: Diagnosis not present

## 2017-07-04 DIAGNOSIS — R079 Chest pain, unspecified: Secondary | ICD-10-CM | POA: Diagnosis not present

## 2017-07-04 DIAGNOSIS — R0602 Shortness of breath: Secondary | ICD-10-CM | POA: Diagnosis not present

## 2017-07-04 DIAGNOSIS — Z79899 Other long term (current) drug therapy: Secondary | ICD-10-CM | POA: Insufficient documentation

## 2017-07-04 DIAGNOSIS — Z87891 Personal history of nicotine dependence: Secondary | ICD-10-CM | POA: Diagnosis not present

## 2017-07-04 DIAGNOSIS — I252 Old myocardial infarction: Secondary | ICD-10-CM | POA: Diagnosis not present

## 2017-07-04 LAB — CBC
HCT: 40.4 % (ref 39.0–52.0)
Hemoglobin: 13.4 g/dL (ref 13.0–17.0)
MCH: 29.6 pg (ref 26.0–34.0)
MCHC: 33.2 g/dL (ref 30.0–36.0)
MCV: 89.4 fL (ref 78.0–100.0)
Platelets: 325 K/uL (ref 150–400)
RBC: 4.52 MIL/uL (ref 4.22–5.81)
RDW: 13.6 % (ref 11.5–15.5)
WBC: 8 K/uL (ref 4.0–10.5)

## 2017-07-04 LAB — BASIC METABOLIC PANEL WITH GFR
Anion gap: 6 (ref 5–15)
BUN: 16 mg/dL (ref 6–20)
CO2: 27 mmol/L (ref 22–32)
Calcium: 9.5 mg/dL (ref 8.9–10.3)
Chloride: 105 mmol/L (ref 101–111)
Creatinine, Ser: 0.88 mg/dL (ref 0.61–1.24)
GFR calc Af Amer: >60 mL/min (ref >60)
GFR calc non Af Amer: >60 mL/min (ref >60)
Glucose, Bld: 144 mg/dL — ABNORMAL HIGH (ref 65–99)
Potassium: 3.9 mmol/L (ref 3.5–5.1)
Sodium: 138 mmol/L (ref 135–145)

## 2017-07-04 LAB — PROTIME-INR
INR: 0.89
Prothrombin Time: 12 s (ref 11.4–15.2)

## 2017-07-04 LAB — I-STAT TROPONIN, ED
Troponin i, poc: 0 ng/mL (ref 0.00–0.08)
Troponin i, poc: 0 ng/mL (ref 0.00–0.08)

## 2017-07-04 IMAGING — DX DG CHEST 2V
3 series · 3 of 3 positions shown · non-contrast
Comparison: None.

CLINICAL DATA: Chest pain and pressure.

EXAM:
CHEST  2 VIEW

[chest pa]
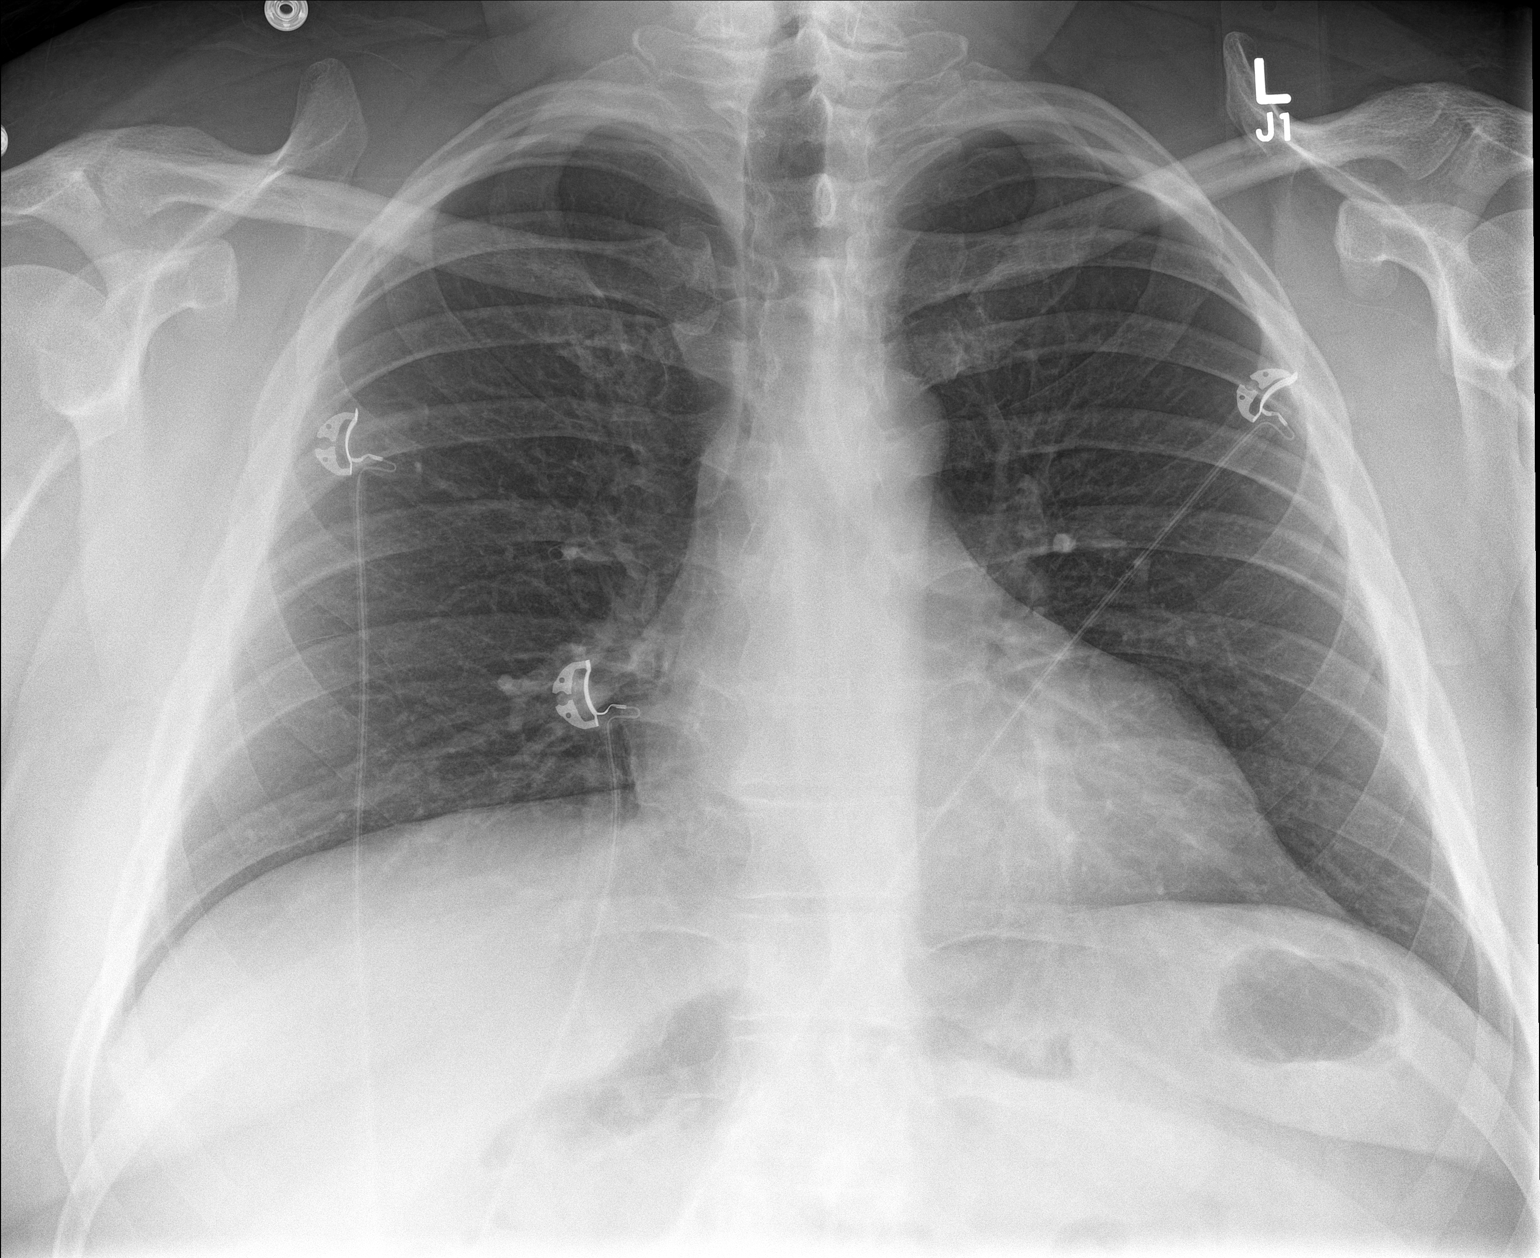

[chest lat (1 of 2)]
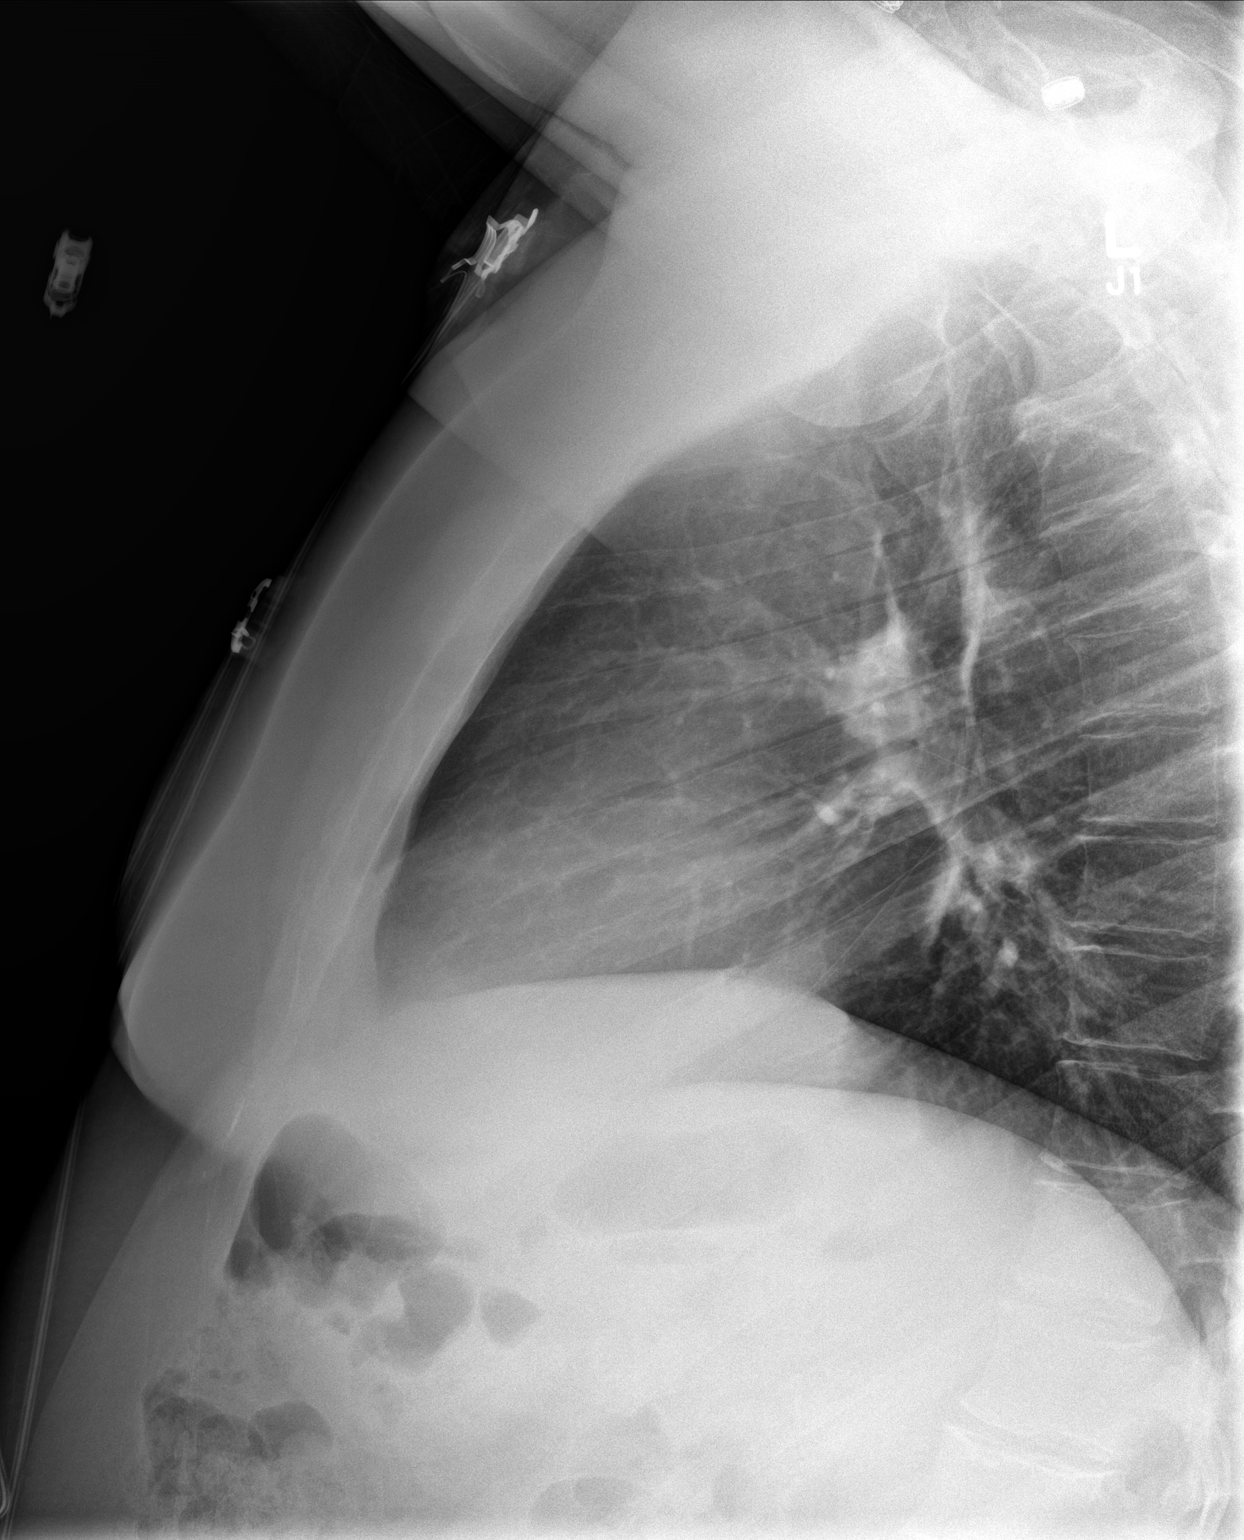

[chest lat (2 of 2)]
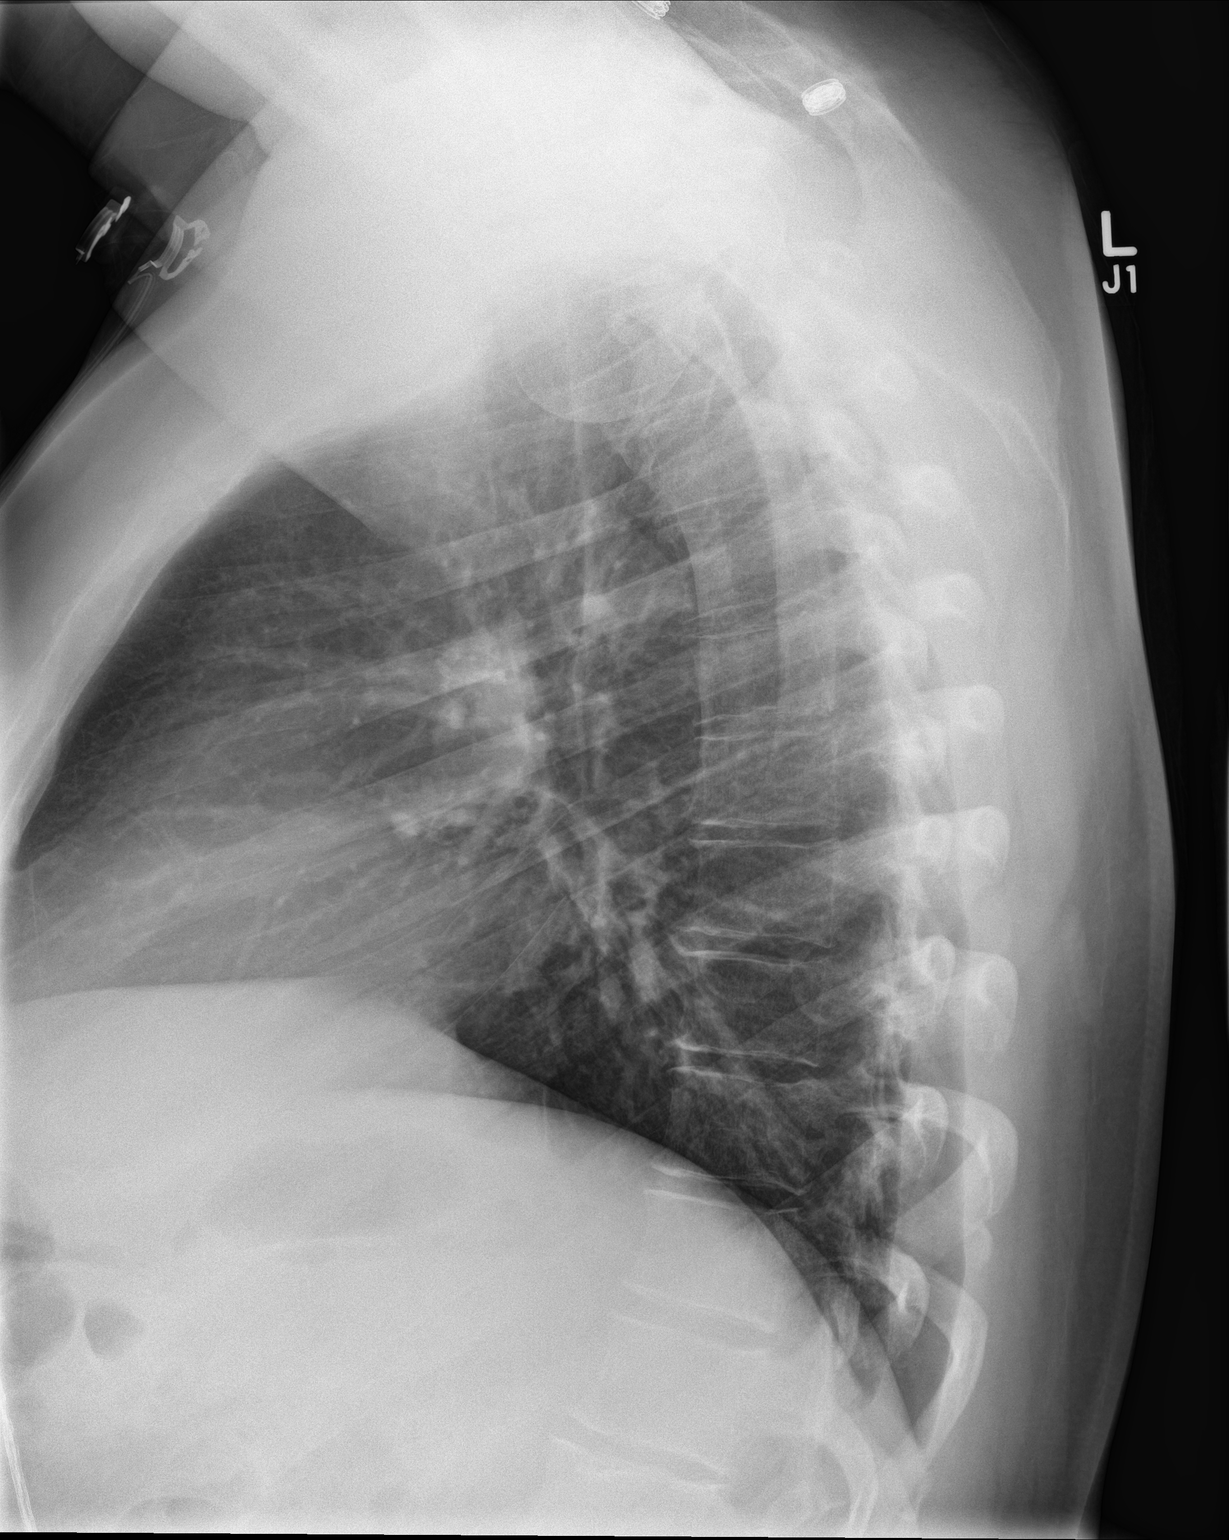

[3 of 3 positions shown; findings below may reference images not displayed]

FINDINGS: The heart size and mediastinal contours are within normal limits.
Both lungs are clear. The visualized skeletal structures are
unremarkable.
IMPRESSION: Normal chest.

## 2017-07-04 MED ORDER — HEPARIN (PORCINE) IN NACL 100-0.45 UNIT/ML-% IJ SOLN
1200.0000 [IU]/h | INTRAMUSCULAR | Status: DC
  Filled 2017-07-04: qty 250

## 2017-07-04 MED ORDER — HEPARIN BOLUS VIA INFUSION: 4000.0000 [IU] | Freq: Once | INTRAVENOUS | Status: DC

## 2017-07-04 MED ORDER — NITROGLYCERIN 2 % TD OINT
1.0000 [in_us] | TOPICAL_OINTMENT | Freq: Four times a day (QID) | TRANSDERMAL | Status: DC
  Administered 2017-07-04: 1 [in_us] via TOPICAL
  Filled 2017-07-04: qty 1

## 2017-07-04 MED ORDER — ASPIRIN 81 MG PO CHEW
324.0000 mg | CHEWABLE_TABLET | Freq: Once | ORAL | Status: AC
  Administered 2017-07-04: 324 mg via ORAL
  Filled 2017-07-04: qty 4

## 2017-07-04 NOTE — ED Notes (Signed)
Pt stated that his pain was a level 6 on a scale of 0 to 10 before giving dose of nitroglycerin. Pt now states that his pain is a level 3 on a scale of 0 to 10 post nitroglycerin 30 minutes.

## 2017-07-04 NOTE — Progress Notes (Addendum)
ANTICOAGULATION CONSULT NOTE - Initial Consult  Pharmacy Consult for Heparin Indication: chest pain/ACS  No Known Allergies  Patient Measurements: Height: 5\' 11"  (180.3 cm) Weight: 270 lb (122.5 kg) IBW/kg (Calculated) : 75.3 Heparin Dosing Weight: 102.4 kg  Vital Signs: Temp: 98.8 F (37.1 C) (09/02 1135) Temp Source: Oral (09/02 1135) BP: 136/83 (09/02 1330) Pulse Rate: 51 (09/02 1330)  Labs:  Recent Labs  07/04/17 1157  HGB 13.4  HCT 40.4  PLT 325  CREATININE 0.88    Estimated Creatinine Clearance: 142.7 mL/min (by C-G formula based on SCr of 0.88 mg/dL).   Medical History: Past Medical History:  Diagnosis Date  . CAD (coronary artery disease)    cath 9/26 & 07/31/2015 100% total occlusion of post atrio branch of RCA, LV supplied by collaterals from the L atrial recurrent branch of distal LCx, 75-80% mid to distal LCx stenosis, high grade complex stenosis of OM2, patent LAD and RI, EF 55-60%. On 9/8, single DES to midLCx extending into OM2.  . Headache    "weekly" (07/29/2015)  . Hypercholesteremia   . NSTEMI (non-ST elevated myocardial infarction) (HCC) 07/28/2015    Medications:   (Not in a hospital admission)  Assessment: 46 yo male ED patient, chest pain Pharmacy consult for heparin Labs reviewed PTA medications reviewed  Goal of Therapy:  Heparin level 0.3-0.7 units/ml Monitor platelets by anticoagulation protocol: Yes   Plan:  Give 4000 units bolus x 1 Start heparin infusion at 1200 units/hr Check anti-Xa level in 6 hours and daily while on heparin Continue to monitor H&H and platelets  Raquel JamesPittman, Taylar Hartsough Bennett 07/04/2017,2:17 PM   UPDATE: 07/04/17 1430    MD cancelled protocol                        Heparin not started  Josephine IgoPittman, Janiel Derhammer Bennett Clinical Pharmacist

## 2017-07-04 NOTE — ED Provider Notes (Signed)
[facility]-EMER[facility]ENCY DEPT Provider Note   CSN: [id number] Arrival date & time: 07/04/17  1130     History   Chief Complaint Chief Complaint  Patient presents with  . Chest Pain    HPI Alex Barnes is a 46 y.o. male.  HPI Patient presents to the emergency room for evaluation of chest pain. Patient has a history of coronary artery disease. He had cardiac stents placed in September 2016.  Patient states he's had some episodes of chest pain since his heart attack but has not had any recurrent heart attack since 2016.  When he told his cardiologist about the chest pain he was started on a beta blocker. Patient felt that his chest pain episodes improved after that but they have not completely resolved and he has had episodes of chest pain since taking the metoprolol.  This morning the patient started having pain in the left side of his chest again associated with some shortness of breath, diaphoresis and lightheadedness.  It was more intense than usual so he came to the ED.  Past Medical History:  Diagnosis Date  . CAD (coronary artery disease)    cath 9/26 & 07/31/2015 100% total occlusion of post atrio Barnes of RCA, LV supplied by collaterals from the L atrial recurrent Barnes of distal LCx, 75-80% mid to distal LCx stenosis, high grade complex stenosis of OM2, patent LAD and RI, EF 55-60%. On 9/8, single DES to midLCx extending into OM2.  . Headache    "weekly" (07/29/2015)  . Hypercholesteremia   . NSTEMI (non-ST elevated myocardial infarction) (HCC) 07/28/2015    Patient Active Problem List   Diagnosis Date Noted  . Bradycardia 08/23/2015  . Chest pain 08/23/2015  . Stented coronary artery   . Moderate obesity 07/31/2015  . Hyperlipidemia 07/30/2015  . 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 TIA (transient ischemic attack) 07/30/2015  . NSTEMI (non-ST elevated myocardial infarction) (HCC) 07/29/2015    Past Surgical History:  Procedure Laterality  Date  . CARDIAC CATHETERIZATION  2006; 07/29/2015  . CARDIAC CATHETERIZATION N/A 07/29/2015   Procedure: Left Heart Cath and Coronary Angiography;  Surgeon: Lyn Records, MD;  Location: [facility];  Service: Cardiovascular;  Laterality: N/A;  . CARDIAC CATHETERIZATION N/A 07/31/2015   Procedure: Coronary Stent Intervention;  Surgeon: Iran Ouch, MD;  Location: [facility];  Service: Cardiovascular;  Laterality: N/A;  . CARDIAC CATHETERIZATION N/A 07/31/2015   Procedure: Left Heart Cath and Coronary Angiography;  Surgeon: Iran Ouch, MD;  Location: [facility];  Service: Cardiovascular;  Laterality: N/A;  . CHOLECYSTECTOMY    . TONSILLECTOMY  ~ 1989  . WISDOM TOOTH EXTRACTION  1993       Home Medications    Prior to Admission medications   Medication Sig Start Date End Date Taking? Authorizing Provider  aspirin 81 MG chewable tablet Chew 1 tablet (81 mg total) by mouth daily. 08/01/15  Yes Azalee Course, PA  lisinopril (PRINIVIL,ZESTRIL) 2.5 MG tablet Take 1 tablet (2.5 mg total) by mouth daily. 03/30/17  Yes Barnes, Dorothe Pea, MD  metoprolol tartrate (LOPRESSOR) 25 MG tablet Take 0.5 tablets (12.5 mg total) by mouth 2 (two) times daily. 05/19/17 08/17/17 Yes Barnes, Dorothe Pea, MD  nitroGLYCERIN (NITROSTAT) 0.4 MG SL tablet Place 1 tablet (0.4 mg total) under the tongue every 5 (five) minutes as needed for chest pain. 03/23/17  Yes Antoine Poche, MD  pantoprazole (PROTONIX) 40 MG tablet  Take 40 mg by mouth daily.   Yes [provider]  rosuvastatin (CRESTOR) 40 MG tablet Take 1 tablet (40 mg total) by mouth daily. 05/19/17  Yes Barnes, Dorothe Pea, MD    Family History Family History  Problem Relation Age of Onset  . Cancer Father     Social History Social History  Substance Use Topics  . Smoking status: Former Smoker    Packs/day: 0.50    Years: 10.00    Types: Cigarettes    Quit date: 11/07/1996  . Smokeless tobacco: Former Neurosurgeon      Comment: "quit smoking cigarettes in 1998"  . Alcohol use 0.0 oz/week     Comment: 6 drinks weekly      Allergies   Patient has no known allergies.   Review of Systems Review of Systems  All other systems reviewed and are negative.    Physical Exam Updated Vital Signs BP 125/76   Pulse 67   Temp 98.8 F (37.1 C) (Oral)   Resp 19   Ht 1.803 m (5\' 11" )   Wt 122.5 kg (270 lb)   SpO2 99%   BMI 37.66 kg/m   Physical Exam  Constitutional: He appears well-developed and well-nourished. No distress.  HENT:  Head: Normocephalic and atraumatic.  Right Ear: External ear normal.  Left Ear: External ear normal.  Eyes: Conjunctivae are normal. Right eye exhibits no discharge. Left eye exhibits no discharge. No scleral icterus.  Neck: Neck supple. No tracheal deviation present.  Cardiovascular: Normal rate, regular rhythm and intact distal pulses.   Pulmonary/Chest: Effort normal and breath sounds normal. No stridor. No respiratory distress. He has no wheezes. He has no rales.  Abdominal: Soft. Bowel sounds are normal. He exhibits no distension. There is no tenderness. There is no rebound and no guarding.  Musculoskeletal: He exhibits no edema or tenderness.  Neurological: He is alert. He has normal strength. No cranial nerve deficit (no facial droop, extraocular movements intact, no slurred speech) or sensory deficit. He exhibits normal muscle tone. He displays no seizure activity. Coordination normal.  Skin: Skin is warm and dry. No rash noted.  Psychiatric: He has a normal mood and affect.  Nursing note and vitals reviewed.    ED Treatments / Results  Labs (all labs ordered are listed, but only abnormal results are displayed) Labs Reviewed  BASIC METABOLIC PANEL - Abnormal; Notable for the following:       Result Value   Glucose, Bld 144 (*)    All other components within normal limits  CBC  PROTIME-INR  I-STAT TROPONIN, ED  I-STAT TROPONIN, ED    EKG  EKG  Interpretation  Date/Time:  Sunday July 04 2017 11:39:28 EDT Ventricular Rate:  61 PR Interval:  174 QRS Duration: 96 QT Interval:  454 QTC Calculation: 457 R Axis:   34 Text Interpretation:  Normal sinus rhythm Inferior Q waves noted Abnormal ECG No significant change since last tracing Confirmed by Linwood Dibbles [phone]) on 07/04/2017 11:43:53 AM       Radiology Dg Chest 2 View  Result Date: 07/04/2017 CLINICAL DATA:  Left chest pain and shortness of breath. Diaphoresis. EXAM: CHEST  2 VIEW COMPARISON:  08/23/2015 and chest CT report dated 12/27/2015. FINDINGS: Poor inspiration. Normal sized heart. Clear lungs. Cholecystectomy clips. Unremarkable bones. IMPRESSION: No acute abnormality. Electronically Signed   By: Beckie Salts M.D.   On: 07/04/2017 12:35    Procedures Procedures (including critical care time)  Medications Ordered in ED Medications  nitroGLYCERIN (NITROGLYN) 2 % ointment 1 inch (1 inch Topical Given 07/04/17 1242)  aspirin chewable tablet 324 mg (324 mg Oral Given 07/04/17 1241)     Initial Impression / Assessment and Plan / ED Course  I have reviewed the triage vital signs and the nursing notes.  Pertinent labs & imaging results that were available during my care of the patient were reviewed by me and considered in my medical decision making (see chart for details).  Clinical Course as of Jul 04 1544  Sun Jul 04, 2017  1516 Repeat troponin 0.0  [JK]    Clinical Course User Index [JK] Linwood DibblesKnapp, Robley Matassa, MD    Patient presented to the emergency room for evaluation of chest pain. Patient has a history of coronary artery disease, and STEMI and cardiac stents.  Patient however has been having chest pain for a couple of months now. Episode this morning was more intense so he decided to come to the emergency room. Patient was given nitroglycerin with slight improvement but ultimately the symptoms resolved over time. Patient has a normal EKG and his 2 sets of cardiac enzymes  are normal. I discussed having the patient stay the hospital for further evaluation considering his history of heart disease.  I'm concerned about the possibility of unstable angina but I am reassured by the fact that this is a not and new episode of chest pain for him.  Patient states he would prefer to go home. He has nitroglycerin they will take if he has any recurrent pain and he understands he needs to return to the hospital immediately. Otherwise he plans to see his cardiologist this week.  Final Clinical Impressions(s) / ED Diagnoses   Final diagnoses:  Chest pain, unspecified type    New Prescriptions New Prescriptions   No medications on file     Linwood DibblesKnapp, Jaena Brocato, MD 07/04/17 1545

## 2017-07-04 NOTE — Discharge Instructions (Signed)
Follow-up with your cardiologist this coming week to discuss further evaluation, return to the emergency room if you  have any recurrent episodes

## 2017-07-04 NOTE — ED Triage Notes (Signed)
Pt c/o left sided chest pain, SOB, lightheadedness, diaphoresis that started this morning. Pain has now decreased from a 8 to 5 on pain scale.

## 2017-07-19 DIAGNOSIS — H35033 Hypertensive retinopathy, bilateral: Secondary | ICD-10-CM | POA: Diagnosis not present

## 2017-07-29 IMAGING — DX DG CHEST 2V
2 series · 2 of 2 positions shown · non-contrast
Comparison: 07/29/2015

CLINICAL DATA: Left-sided chest pain overnight

EXAM:
CHEST - 2 VIEW

[chest pa]
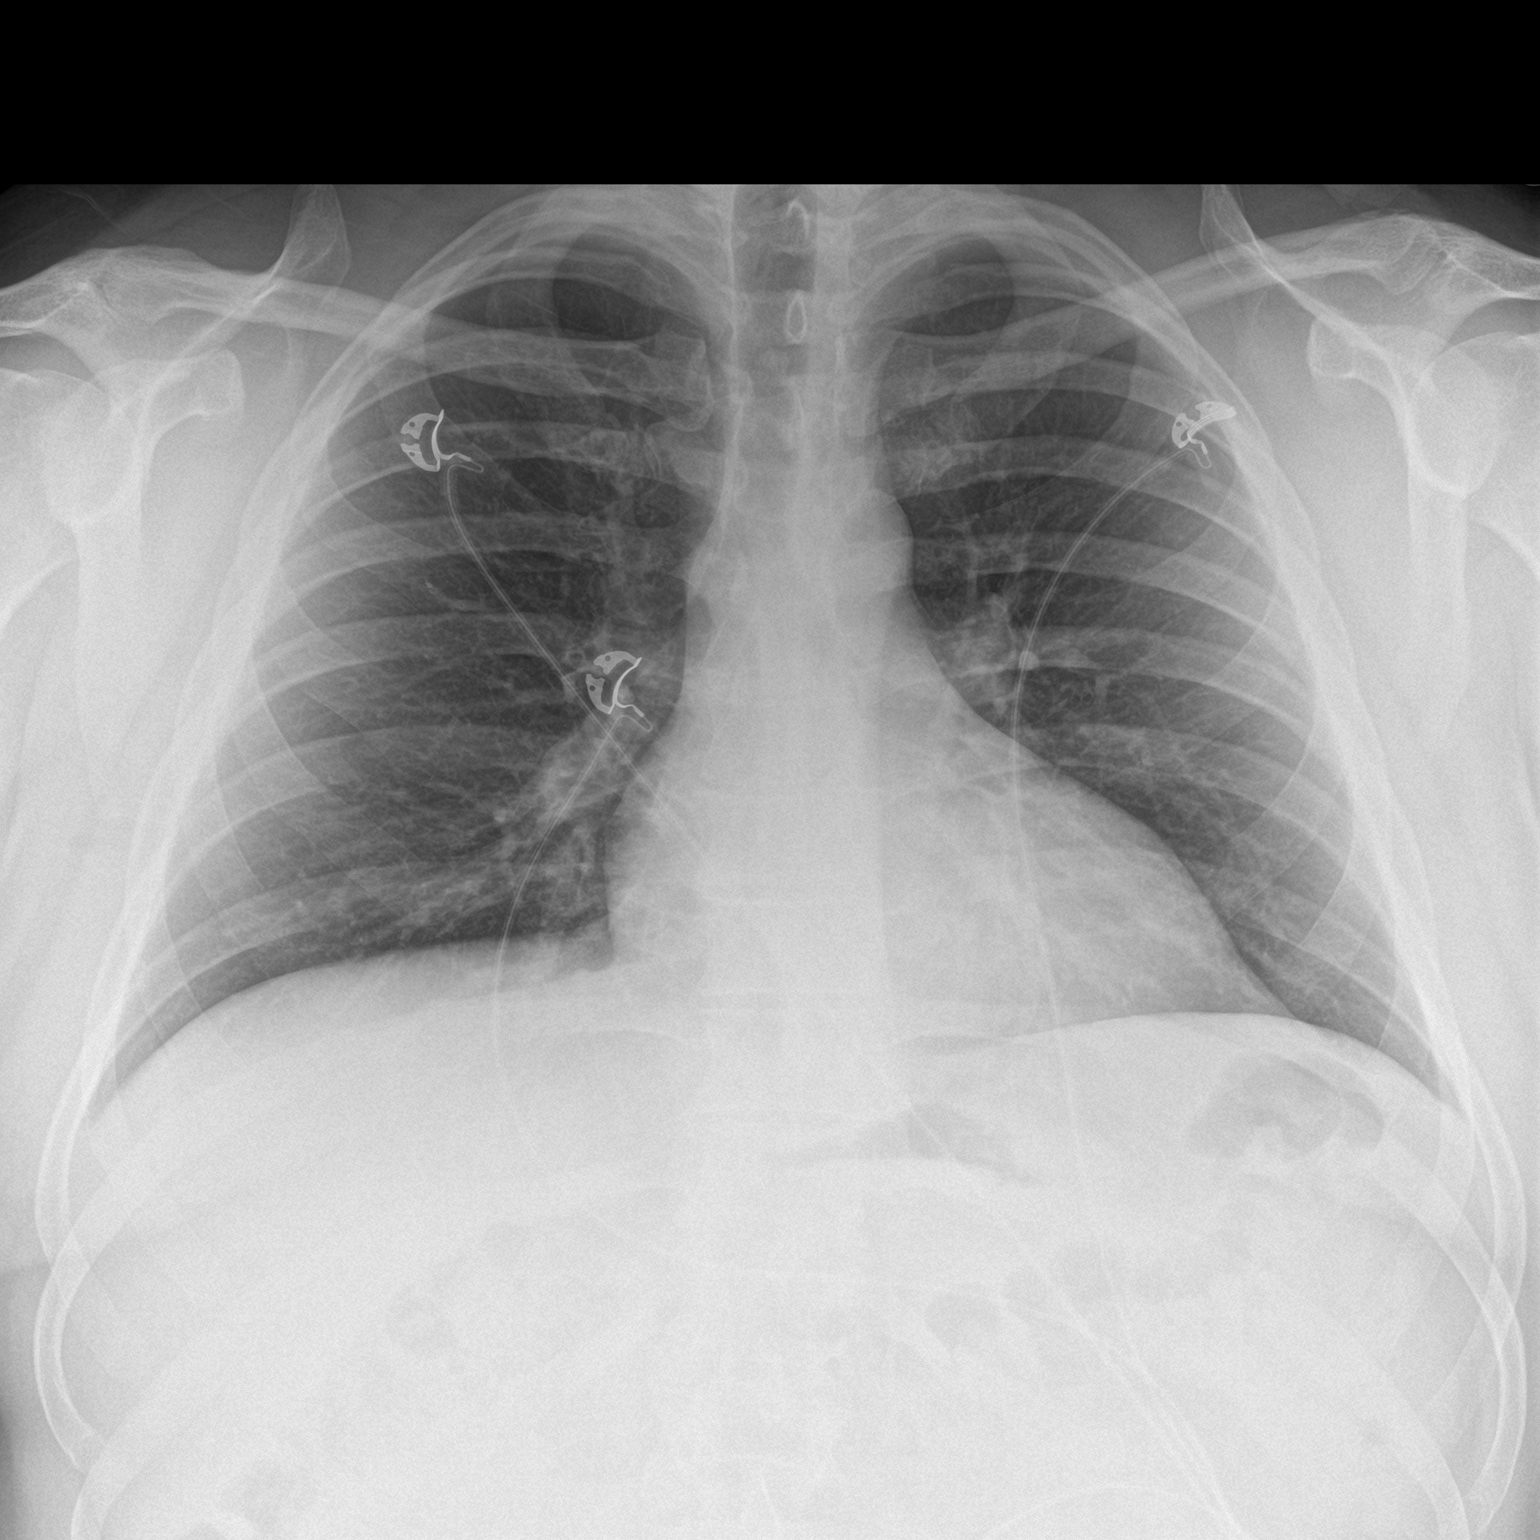

[chest lat]
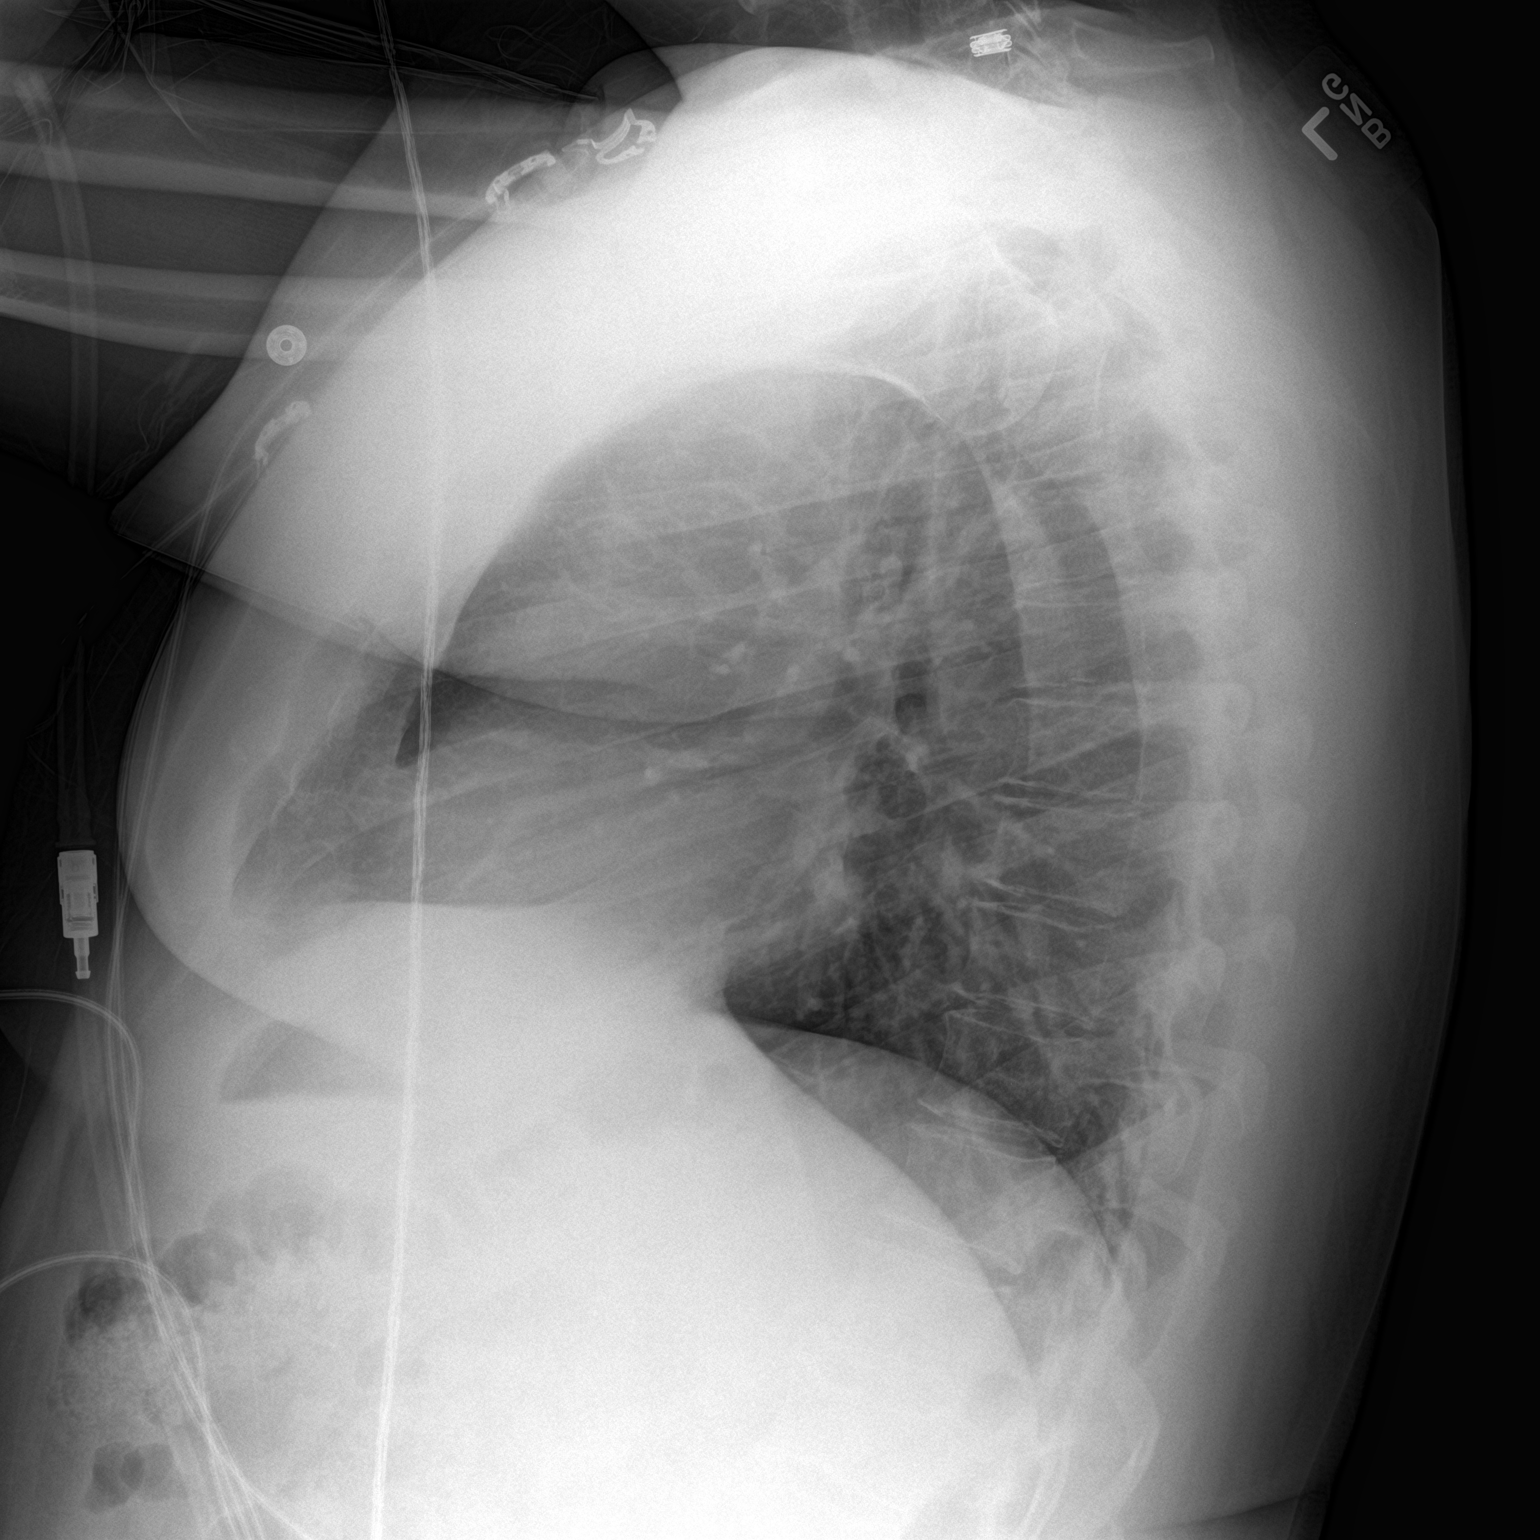

[2 of 2 positions shown; findings below may reference images not displayed]

FINDINGS: The heart size and mediastinal contours are within normal limits.
Both lungs are clear. The visualized skeletal structures are
unremarkable.
IMPRESSION: No active disease.

## 2017-08-24 ENCOUNTER — Ambulatory Visit: Payer: BLUE CROSS/BLUE SHIELD | Admitting: Cardiology

## 2017-08-24 NOTE — Progress Notes (Deleted)
Clinical Summary Mr. Godbolt is a 46 y.o.male seen today for follow up of the following medical problems.   1. CAD - admit 07/2015 with NSTEMI. Found to have totally occluded RCA, as well as significant LCX and OM2 disease. S/p DES to LCX and OM2, RCA occlusion managed medically. - 10/2015 echo LVEF 55-60%, no WMAs - treated for post-MI pericarditis with colchicine and ASA/NSAIDs for a period - l he continued to have atypical chest pain. Right sided, worst with position, worst with deep breathing. Constant pain lasting days at a time.  -  ESR and CRP had normalized. CT chest looked good, had some gallstones without cholelithiasis.Marland Kitchen ALT mildly elevated at 74, other liver tests normal. Had his gallbladder removed, his chest pain symptoms resolved at that time   - has had some recent chest pain. With high levels of exertion has chest pain. Pressure in midchest, radiates. Can get diaphoretic. Not better with NG. Can linger for hours after onset. Can occur at normal walking pace - started late last year. Stable frequency, stable severity.    03/2017 nuclear stress: duke treadmill scor eof 9, inferior scar with very mild peri-infarct ischemia, low risk  - since last visit we started imdur 19m daily.  - still with chest pain at times, unchanged. Tried imdur caused headaches and he stopped taking.   - last visit we started lopressor 12.525mbid.  - 07/2017 ER visit with chest pain. Enzymes negative, EKG without acute ischemic changes -   2. Hyperlipidemia - compliant with statin - 05/2017 lipids: TC 158 TG 95 HDL 35 LDL 105 - we increased crestor to 4056maily just a few days ago   Past Medical History:  Diagnosis Date  . CAD (coronary artery disease)    cath 9/26 & 07/31/2015 100% total occlusion of post atrio branch of RCA, LV supplied by collaterals from the L atrial recurrent branch of distal LCx, 75-80% mid to distal LCx stenosis, high grade complex stenosis of OM2, patent LAD  and RI, EF 55-60%. On 9/8, single DES to midLCx extending into OM2.  . Headache    "weekly" (07/29/2015)  . Hypercholesteremia   . [facility]STEMI (non-ST elevated myocardial infarction) [place]     No Known Allergies   Current Outpatient Prescriptions  Medication Sig Dispense Refill  . aspirin 81 MG chewable tablet Chew 1 tablet (81 mg total) by mouth daily.    . lMarland Kitchensinopril (PRINIVIL,ZESTRIL) 2.5 MG tablet Take 1 tablet (2.5 mg total) by mouth daily. 90 tablet 1  . metoprolol tartrate (LOPRESSOR) 25 MG tablet Take 0.5 tablets (12.5 mg total) by mouth 2 (two) times daily. 30 tablet 6  . nitroGLYCERIN (NITROSTAT) 0.4 MG SL tablet Place 1 tablet (0.4 mg total) under the tongue every 5 (five) minutes as needed for chest pain. 25 tablet 3  . pantoprazole (PROTONIX) 40 MG tablet Take 40 mg by mouth daily.    . rosuvastatin (CRESTOR) 40 MG tablet Take 1 tablet (40 mg total) by mouth daily. 30 tablet 6   No current facility-administered medications for this visit.      Past Surgical History:  Procedure Laterality Date  . CARDIAC CATHETERIZATION  2006; 07/29/2015  . CARDIAC CATHETERIZATION N/A 07/29/2015   Procedure: Left Heart Cath and Coronary Angiography;  Surgeon: HenBelva CromeD;  Location: [facility];  Service: Cardiovascular;  Laterality: N/A;  . CARDIAC CATHETERIZATION N/A 07/31/2015   Procedure: Coronary Stent Intervention;  Surgeon: MuhWellington HampshireD;  Location: [place] [facility];  Service: Cardiovascular;  Laterality: N/A;  . CARDIAC CATHETERIZATION N/A 07/31/2015   Procedure: Left Heart Cath and Coronary Angiography;  Surgeon: Wellington Hampshire, MD;  Location: [place] [facility];  Service: Cardiovascular;  Laterality: N/A;  . CHOLECYSTECTOMY    . TONSILLECTOMY  ~ 1989  . WISDOM TOOTH EXTRACTION  1993     No Known Allergies    Family History  Problem Relation Age of Onset  . Cancer Father      Social History Mr. Sarver reports that he quit smoking about 20  years ago. His smoking use included Cigarettes. He has a 5.00 pack-year smoking history. He has quit using smokeless tobacco. Mr. Haji reports that he drinks alcohol.   Review of Systems CONSTITUTIONAL: No weight loss, fever, chills, weakness or fatigue.  HEENT: Eyes: No visual loss, blurred vision, double vision or yellow sclerae.No hearing loss, sneezing, congestion, runny nose or sore throat.  SKIN: No rash or itching.  CARDIOVASCULAR:  RESPIRATORY: No shortness of breath, cough or sputum.  GASTROINTESTINAL: No anorexia, nausea, vomiting or diarrhea. No abdominal pain or blood.  GENITOURINARY: No burning on urination, no polyuria NEUROLOGICAL: No headache, dizziness, syncope, paralysis, ataxia, numbness or tingling in the extremities. No change in bowel or bladder control.  MUSCULOSKELETAL: No muscle, back pain, joint pain or stiffness.  LYMPHATICS: No enlarged nodes. No history of splenectomy.  PSYCHIATRIC: No history of depression or anxiety.  ENDOCRINOLOGIC: No reports of sweating, cold or heat intolerance. No polyuria or polydipsia.  Marland Kitchen   Physical Examination There were no vitals filed for this visit. There were no vitals filed for this visit.  Gen: resting comfortably, no acute distress HEENT: no scleral icterus, pupils equal round and reactive, no palptable cervical adenopathy,  CV Resp: Clear to auscultation bilaterally GI: abdomen is soft, non-tender, non-distended, normal bowel sounds, no hepatosplenomegaly MSK: extremities are warm, no edema.  Skin: warm, no rash Neuro:  no focal deficits Psych: appropriate affect   Diagnostic Studies 07/2015 cath  Ost 2nd Mrg to 2nd Mrg lesion, 85% stenosed.  Prox Cx lesion, 75% stenosed.  Post Atrio lesion, 100% stenosed.  Ost LAD to Prox LAD lesion, 30% stenosed.  Mid RCA to Dist RCA lesion, 40% stenosed.   Recent acute inferolateral infarction now greater than 18 hours old. Angiography demonstrates total occlusion of  the RCA beyond the origin of the PDA. The left ventricular branch is supplied by collaterals from the left atrial recurrent branch of the distal circumflex.  75-80% mid to distal circumflex stenosis. High-grade complex stenosis in the second obtuse marginal branch which is small to moderate in size.  Widely patent LAD and a dominant branching ramus intermedius.  Overall normal LV function with EF 55-60%, and inferobasal hypokinesis.  07/2017 PCI  Post Atrio lesion, 100% stenosed.  Ost LAD to Prox LAD lesion, 30% stenosed.  Mid RCA to Dist RCA lesion, 40% stenosed.  Ost 2nd Mrg to 2nd Mrg lesion, 90% stenosed. There is a 0% residual stenosis post intervention.  A drug-eluting stent was placed.  Prox Cx lesion, 75% stenosed. There is a 0% residual stenosis post intervention.   1. Continued occlusion of the right posterior AV groove artery with left to right collaterals. Severe mid left circumflex stenosis into OM 2. 2. Mildly elevated left ventricular end-diastolic pressure. 3. Successful angioplasty and drug-eluting stent placement to the mid left circumflex extending into OM 2. I used one long stent which covered both lesions.  03/2017 nuclear stress  Blood pressure demonstrated a normal response to exercise. Duke treadmill score of 9 consistent with low risk for major cardiac events  There was no ST segment deviation noted during stress.  Findings consistent with prior inferior myocardial infarction with very mild peri-infarct ischemia.  This is a low risk study. Particularly when considering good functional capacity and low risk Duke treadmill score.  The left ventricular ejection fraction is normal (55-65%).    Assessment and Plan  1. CAD -recent chest pain. Nuclear stress test overall low risk - did not tolerate imdur, we will try lopressor 12.94m bid. Previous low heart rates have resolved, will try beta blocker - if ongoing symptoms, consider trial of antacid.    2. Hyperlipidemia - lipids above goal, increase crestor to 497mdaily   F/u 3 months      JoArnoldo LenisM.D., F.A.C.C.

## 2017-12-25 ENCOUNTER — Other Ambulatory Visit: Payer: Self-pay | Admitting: Cardiology

## 2018-01-29 ENCOUNTER — Other Ambulatory Visit: Payer: Self-pay | Admitting: Cardiology

## 2018-02-14 ENCOUNTER — Telehealth: Payer: Self-pay | Admitting: Cardiology

## 2018-02-14 MED ORDER — ROSUVASTATIN CALCIUM 40 MG PO TABS: ORAL_TABLET | ORAL | 0 refills | Status: DC

## 2018-02-14 NOTE — Telephone Encounter (Signed)
°*  STAT* If patient is at the pharmacy, call can be transferred to refill team.   1. Which medications need to be refilled? (please list name of each medication and dose if known)  rosuvastatin (CRESTOR) 40 MG tablet [[id number]]   2. Which pharmacy/location (including street and city if local pharmacy) is medication to be sent to? [facility]  3. Do they need a 30 day or 90 day supply?  90 day    Pt is scheduled to see Dr. Wyline MoodBranch on 03/03/18

## 2018-02-14 NOTE — Telephone Encounter (Signed)
RX sent

## 2018-03-03 ENCOUNTER — Encounter: Payer: Self-pay | Admitting: Cardiology

## 2018-03-03 ENCOUNTER — Other Ambulatory Visit: Payer: Self-pay

## 2018-03-03 ENCOUNTER — Ambulatory Visit (INDEPENDENT_AMBULATORY_CARE_PROVIDER_SITE_OTHER): Payer: BLUE CROSS/BLUE SHIELD | Admitting: Cardiology

## 2018-03-03 VITALS — BP 141/88 | HR 70 | Ht 71.0 in | Wt 278.0 lb

## 2018-03-03 DIAGNOSIS — I25118 Atherosclerotic heart disease of native coronary artery with other forms of angina pectoris: Secondary | ICD-10-CM

## 2018-03-03 DIAGNOSIS — I10 Essential (primary) hypertension: Secondary | ICD-10-CM | POA: Diagnosis not present

## 2018-03-03 DIAGNOSIS — E782 Mixed hyperlipidemia: Secondary | ICD-10-CM | POA: Diagnosis not present

## 2018-03-03 MED ORDER — ROSUVASTATIN CALCIUM 40 MG PO TABS: ORAL_TABLET | ORAL | 1 refills | Status: DC

## 2018-03-03 MED ORDER — METOPROLOL TARTRATE 25 MG PO TABS: 25.0000 mg | ORAL_TABLET | Freq: Two times a day (BID) | ORAL | 1 refills | Status: DC

## 2018-03-03 NOTE — Progress Notes (Signed)
Clinical Summary Alex Barnes is a 47 y.o.male seen today for follow up of the following medical problems.   1. CAD - admit 07/2015 with NSTEMI. Found to have totally occluded RCA, as well as significant LCX and OM2 disease. S/p DES to LCX and OM2, RCA occlusion managed medically. - 10/2015 echo LVEF 55-60%, no WMAs - treated for post-MI pericarditis with colchicine and ASA/NSAIDs for a period -he continued to have atypical chest pain. Right sided, worst with position, worst with deep breathing. Constant pain lasting days at a time.  - since last visit repeat ESR and CRP have normalized. CT chest looked good, had some gallstones without cholelithiasis.Marland Kitchen ALT mildly elevated at 74, other liver tests normal.  -had his gallbladder removed, his chest pain symptoms resolved at that time    03/2017 nuclear stress: duke treadmill score of 9, inferior scar with very mild peri-infarct ischemia, low risk. Correlates with known RCA CTO.  - did not tolerate imdur. - last visit started on low dose beta blocker (had not been on in the past due to low HRs which improved last visit) - symptoms much better with metoprolol. Still with some symptoms at times, typically only at high levels of exertion.   2. Hyperlipidemia - compliant with statin  3. HTN -compliant with meds   Past Medical History:  Diagnosis Date  . CAD (coronary artery disease)    cath 9/26 & 07/31/2015 100% total occlusion of post atrio Alex Barnes of RCA, LV supplied by collaterals from the L atrial recurrent Alex Barnes of distal LCx, 75-80% mid to distal LCx stenosis, high grade complex stenosis of OM2, patent LAD and RI, EF 55-60%. On 9/8, single DES to midLCx extending into OM2.  . Headache    "weekly" (07/29/2015)  . Hypercholesteremia   . NSTEMI (non-ST elevated myocardial infarction) ([place]) 07/28/2015     No Known Allergies   Current Outpatient Medications  Medication Sig Dispense Refill  . aspirin 81 MG chewable tablet Chew  1 tablet (81 mg total) by mouth daily.    Marland Kitchen lisinopril (PRINIVIL,ZESTRIL) 2.5 MG tablet Take 1 tablet (2.5 mg total) by mouth daily. 90 tablet 1  . metoprolol tartrate (LOPRESSOR) 25 MG tablet TAKE 1/2 TABELT BY MOUTH TWO TIMES DAILY 15 tablet 1  . nitroGLYCERIN (NITROSTAT) 0.4 MG SL tablet Place 1 tablet (0.4 mg total) under the tongue every 5 (five) minutes as needed for chest pain. 25 tablet 3  . pantoprazole (PROTONIX) 40 MG tablet Take 40 mg by mouth daily.    . rosuvastatin (CRESTOR) 40 MG tablet TAKE 1 TABLET BY MOUTH EVERY DAY. NEED APPT FOR FURTHER REFILLS 30 tablet 0   No current facility-administered medications for this visit.      Past Surgical History:  Procedure Laterality Date  . CARDIAC CATHETERIZATION  2006; 07/29/2015  . CARDIAC CATHETERIZATION N/A 07/29/2015   Procedure: Left Heart Cath and Coronary Angiography;  Surgeon: Belva Crome, MD;  Location: [place] [facility];  Service: Cardiovascular;  Laterality: N/A;  . CARDIAC CATHETERIZATION N/A 07/31/2015   Procedure: Coronary Stent Intervention;  Surgeon: Wellington Hampshire, MD;  Location: [place] [facility];  Service: Cardiovascular;  Laterality: N/A;  . CARDIAC CATHETERIZATION N/A 07/31/2015   Procedure: Left Heart Cath and Coronary Angiography;  Surgeon: Wellington Hampshire, MD;  Location: [place] [facility];  Service: Cardiovascular;  Laterality: N/A;  . CHOLECYSTECTOMY    . TONSILLECTOMY  ~ 1989  . [place]  No Known Allergies    Family History  Problem Relation Age of Onset  . Cancer Father      Social History Alex Barnes reports that he quit smoking about 21 years ago. His smoking use included cigarettes. He has a 5.00 pack-year smoking history. He has quit using smokeless tobacco. Alex Barnes reports that he drinks alcohol.   Review of Systems CONSTITUTIONAL: No weight loss, fever, chills, weakness or fatigue.  HEENT: Eyes: No visual loss, blurred vision, double vision or yellow  sclerae.No hearing loss, sneezing, congestion, runny nose or sore throat.  SKIN: No rash or itching.  CARDIOVASCULAR: per hpi RESPIRATORY: No shortness of breath, cough or sputum.  GASTROINTESTINAL: No anorexia, nausea, vomiting or diarrhea. No abdominal pain or blood.  GENITOURINARY: No burning on urination, no polyuria NEUROLOGICAL: No headache, dizziness, syncope, paralysis, ataxia, numbness or tingling in the extremities. No change in bowel or bladder control.  MUSCULOSKELETAL: No muscle, back pain, joint pain or stiffness.  LYMPHATICS: No enlarged nodes. No history of splenectomy.  PSYCHIATRIC: No history of depression or anxiety.  ENDOCRINOLOGIC: No reports of sweating, cold or heat intolerance. No polyuria or polydipsia.  Marland Kitchen   Physical Examination Vitals:   03/03/18 1525  BP: (!) 141/88  Pulse: 70  SpO2: 96%   Vitals:   03/03/18 1525  Weight: 278 lb (126.1 kg)  Height: '5\' 11"'  (1.803 m)    Gen: resting comfortably, no acute distress HEENT: no scleral icterus, pupils equal round and reactive, no palptable cervical adenopathy,  CV: RRR, nno mr/g, no jvd Resp: Clear to auscultation bilaterally GI: abdomen is soft, non-tender, non-distended, normal bowel sounds, no hepatosplenomegaly MSK: extremities are warm, no edema.  Skin: warm, no rash Neuro:  no focal deficits Psych: appropriate affect   Diagnostic Studies 07/2015 cath  Ost 2nd Mrg to 2nd Mrg lesion, 85% stenosed.  Prox Cx lesion, 75% stenosed.  Post Atrio lesion, 100% stenosed.  Ost LAD to Prox LAD lesion, 30% stenosed.  Mid RCA to Dist RCA lesion, 40% stenosed.   Recent acute inferolateral infarction now greater than 18 hours old. Angiography demonstrates total occlusion of the RCA beyond the origin of the PDA. The left ventricular Alex Barnes is supplied by collaterals from the left atrial recurrent Alex Barnes.  75-80% mid to distal Barnes stenosis. High-grade complex stenosis in  the second obtuse marginal Alex Barnes which is small to moderate in size.  Widely patent LAD and a dominant branching ramus intermedius.  Overall normal LV function with EF 55-60%, and inferobasal hypokinesis.   07/2015 PCI  Post Atrio lesion, 100% stenosed.  Ost LAD to Prox LAD lesion, 30% stenosed.  Mid RCA to Dist RCA lesion, 40% stenosed.  Ost 2nd Mrg to 2nd Mrg lesion, 90% stenosed. There is a 0% residual stenosis post intervention.  A drug-eluting stent was placed.  Prox Cx lesion, 75% stenosed. There is a 0% residual stenosis post intervention.   1. Continued occlusion of the right posterior AV groove artery with left to right collaterals. Severe mid left Barnes stenosis into OM 2. 2. Mildly elevated left ventricular end-diastolic pressure. 3. Successful angioplasty and drug-eluting stent placement to the mid left Barnes extending into OM 2. I used one long stent which covered both lesions.  03/2017 nuclear stress  Blood pressure demonstrated a normal response to exercise. Duke treadmill score of 9 consistent with low risk for major cardiac events  There was no ST segment deviation noted during stress.  Findings consistent with prior  inferior myocardial infarction with very mild peri-infarct ischemia.  This is a low risk study. Particularly when considering good functional capacity and low risk Duke treadmill score.  The left ventricular ejection fraction is normal (55-65%).     Assessment and Plan  1. CAD - Nuclear stress test overall low risk - mild chest pain only with high levels of exertion, increase lopressor to 40m bid for additional antianginal effects.   2. Hyperlipidemia - continue statin - repeat labs  3. HTN - elevated in clinic, goal <130/80 - increase lopressor as described above - given info on DASH diet - bp log in 2 weeks.    F/u 1 year        JArnoldo Lenis M.D.

## 2018-03-03 NOTE — Patient Instructions (Signed)
Your physician wants you to follow-up in: 1 YEAR WITH DR [facility] AND 2 WEEK FOR NURSE VISIT BLOOD PRESSURE CHECK You will receive a reminder letter in the mail two months in advance. If you don't receive a letter, please call our office to schedule the follow-up appointment.  Your physician has recommended you make the following change in your medication:   START METOPROLOL 25 MG TWICE DAILY  Your physician recommends that you return for lab work - WE HAVE GIVEN YOU ORDERS - PLEASE FAST 6-8 HOURS PRIOR TO LAB WORK    DASH Eating Plan DASH stands for "Dietary Approaches to Stop Hypertension." The DASH eating plan is a healthy eating plan that has been shown to reduce high blood pressure (hypertension). It may also reduce your risk for type 2 diabetes, heart disease, and stroke. The DASH eating plan may also help with weight loss. What are tips for following this plan? General guidelines  Avoid eating more than 2,300 mg (milligrams) of salt (sodium) a day. If you have hypertension, you may need to reduce your sodium intake to 1,500 mg a day.  Limit alcohol intake to no more than 1 drink a day for nonpregnant women and 2 drinks a day for men. One drink equals 12 oz of beer, 5 oz of wine, or 1 oz of hard liquor.  Work with your health care provider to maintain a healthy body weight or to lose weight. Ask what an ideal weight is for you.  Get at least 30 minutes of exercise that causes your heart to beat faster (aerobic exercise) most days of the week. Activities may include walking, swimming, or biking.  Work with your health care provider or diet and nutrition specialist (dietitian) to adjust your eating plan to your individual calorie needs. Reading food labels  Check food labels for the amount of sodium per serving. Choose foods with less than 5 percent of the Daily Value of sodium. Generally, foods with less than 300 mg of sodium per serving fit into this eating plan.  To find whole  grains, look for the word "whole" as the first word in the ingredient list. Shopping  Buy products labeled as "low-sodium" or "no salt added."  Buy fresh foods. Avoid canned foods and premade or frozen meals. Cooking  Avoid adding salt when cooking. Use salt-free seasonings or herbs instead of table salt or sea salt. Check with your health care provider or pharmacist before using salt substitutes.  Do not fry foods. Cook foods using healthy methods such as baking, boiling, grilling, and broiling instead.  Cook with heart-healthy oils, such as olive, canola, soybean, or sunflower oil. Meal planning   Eat a balanced diet that includes: ? 5 or more servings of fruits and vegetables each day. At each meal, try to fill half of your plate with fruits and vegetables. ? Up to 6-8 servings of whole grains each day. ? Less than 6 oz of lean meat, poultry, or fish each day. A 3-oz serving of meat is about the same size as a deck of cards. One egg equals 1 oz. ? 2 servings of low-fat dairy each day. ? A serving of nuts, seeds, or beans 5 times each week. ? Heart-healthy fats. Healthy fats called Omega-3 fatty acids are found in foods such as flaxseeds and coldwater fish, like sardines, salmon, and mackerel.  Limit how much you eat of the following: ? Canned or prepackaged foods. ? Food that is high in trans fat, such as  fried foods. ? Food that is high in saturated fat, such as fatty meat. ? Sweets, desserts, sugary drinks, and other foods with added sugar. ? Full-fat dairy products.  Do not salt foods before eating.  Try to eat at least 2 vegetarian meals each week.  Eat more home-cooked food and less restaurant, buffet, and fast food.  When eating at a restaurant, ask that your food be prepared with less salt or no salt, if possible. What foods are recommended? The items listed may not be a complete list. Talk with your dietitian about what dietary choices are best for  you. Grains Whole-grain or whole-wheat bread. Whole-grain or whole-wheat pasta. Brown rice. Modena Morrow. Bulgur. Whole-grain and low-sodium cereals. Pita bread. Low-fat, low-sodium crackers. Whole-wheat flour tortillas. Vegetables Fresh or frozen vegetables (raw, steamed, roasted, or grilled). Low-sodium or reduced-sodium tomato and vegetable juice. Low-sodium or reduced-sodium tomato sauce and tomato paste. Low-sodium or reduced-sodium canned vegetables. Fruits All fresh, dried, or frozen fruit. Canned fruit in natural juice (without added sugar). Meat and other protein foods Skinless chicken or [place]. Ground chicken or [place]. Pork with fat trimmed off. Fish and seafood. Egg whites. Dried beans, peas, or lentils. Unsalted nuts, nut butters, and seeds. Unsalted canned beans. Lean cuts of beef with fat trimmed off. Low-sodium, lean deli meat. Dairy Low-fat (1%) or fat-free (skim) milk. Fat-free, low-fat, or reduced-fat cheeses. Nonfat, low-sodium ricotta or cottage cheese. Low-fat or nonfat yogurt. Low-fat, low-sodium cheese. Fats and oils Soft margarine without trans fats. Vegetable oil. Low-fat, reduced-fat, or light mayonnaise and salad dressings (reduced-sodium). Canola, safflower, olive, soybean, and sunflower oils. Avocado. Seasoning and other foods Herbs. Spices. Seasoning mixes without salt. Unsalted popcorn and pretzels. Fat-free sweets. What foods are not recommended? The items listed may not be a complete list. Talk with your dietitian about what dietary choices are best for you. Grains Baked goods made with fat, such as croissants, muffins, or some breads. Dry pasta or rice meal packs. Vegetables Creamed or fried vegetables. Vegetables in a cheese sauce. Regular canned vegetables (not low-sodium or reduced-sodium). Regular canned tomato sauce and paste (not low-sodium or reduced-sodium). Regular tomato and vegetable juice (not low-sodium or reduced-sodium). Angie Fava.  Olives. Fruits Canned fruit in a light or heavy syrup. Fried fruit. Fruit in cream or butter sauce. Meat and other protein foods Fatty cuts of meat. Ribs. Fried meat. Berniece Salines. Sausage. Bologna and other processed lunch meats. Salami. Fatback. Hotdogs. Bratwurst. Salted nuts and seeds. Canned beans with added salt. Canned or smoked fish. Whole eggs or egg yolks. Chicken or [place] with skin. Dairy Whole or 2% milk, cream, and half-and-half. Whole or full-fat cream cheese. Whole-fat or sweetened yogurt. Full-fat cheese. Nondairy creamers. Whipped toppings. Processed cheese and cheese spreads. Fats and oils Butter. Stick margarine. Lard. Shortening. Ghee. Bacon fat. Tropical oils, such as coconut, palm kernel, or palm oil. Seasoning and other foods Salted popcorn and pretzels. Onion salt, garlic salt, seasoned salt, table salt, and sea salt. Worcestershire sauce. Tartar sauce. Barbecue sauce. Teriyaki sauce. Soy sauce, including reduced-sodium. Steak sauce. Canned and packaged gravies. Fish sauce. Oyster sauce. Cocktail sauce. Horseradish that you find on the shelf. Ketchup. Mustard. Meat flavorings and tenderizers. Bouillon cubes. Hot sauce and Tabasco sauce. Premade or packaged marinades. Premade or packaged taco seasonings. Relishes. Regular salad dressings. Where to find more information:  National Heart, Lung, and [place]: https://wilson-eaton.com/  American Heart Association: www.heart.org Summary  The DASH eating plan is a healthy eating plan that has been shown to  reduce high blood pressure (hypertension). It may also reduce your risk for type 2 diabetes, heart disease, and stroke.  With the DASH eating plan, you should limit salt (sodium) intake to 2,300 mg a day. If you have hypertension, you may need to reduce your sodium intake to 1,500 mg a day.  When on the DASH eating plan, aim to eat more fresh fruits and vegetables, whole grains, lean proteins, low-fat dairy, and heart-healthy  fats.  Work with your health care provider or diet and nutrition specialist (dietitian) to adjust your eating plan to your individual calorie needs. This information is not intended to replace advice given to you by your health care provider. Make sure you discuss any questions you have with your health care provider. Document Released: 10/08/2011 Document Revised: 10/12/2016 Document Reviewed: 10/12/2016 Elsevier Interactive Patient Education  [employer].

## 2018-03-12 ENCOUNTER — Encounter: Payer: Self-pay | Admitting: Cardiology

## 2018-03-18 ENCOUNTER — Ambulatory Visit: Payer: BLUE CROSS/BLUE SHIELD

## 2018-03-21 ENCOUNTER — Ambulatory Visit (INDEPENDENT_AMBULATORY_CARE_PROVIDER_SITE_OTHER): Payer: BLUE CROSS/BLUE SHIELD | Admitting: *Deleted

## 2018-03-21 VITALS — BP 128/90 | HR 79

## 2018-03-21 DIAGNOSIS — I10 Essential (primary) hypertension: Secondary | ICD-10-CM

## 2018-03-21 MED ORDER — LISINOPRIL 2.5 MG PO TABS: 5.0000 mg | ORAL_TABLET | Freq: Every day | ORAL | 1 refills | Status: DC

## 2018-03-21 NOTE — Progress Notes (Signed)
Patient in office for BP check.  Recently started Metoprolol  twice a day.    BP 128/90  79  After sitting 5-10 minutes - 122/90.

## 2018-03-21 NOTE — Progress Notes (Signed)
Patient notified. He will take 2 of his 2.5mg  tabs for now.  Also, suggested that he keep log for another few weeks to make sure that BP is trending down.  Patient verbalized understanding.

## 2018-03-21 NOTE — Progress Notes (Signed)
Increase lisinopril to 5 mg daily

## 2018-04-15 ENCOUNTER — Other Ambulatory Visit: Payer: Self-pay | Admitting: Cardiology

## 2018-04-16 ENCOUNTER — Other Ambulatory Visit: Payer: Self-pay | Admitting: Cardiology

## 2018-04-16 MED ORDER — LISINOPRIL 2.5 MG PO TABS: 5.0000 mg | ORAL_TABLET | Freq: Every day | ORAL | 0 refills | Status: DC

## 2018-05-09 DIAGNOSIS — E291 Testicular hypofunction: Secondary | ICD-10-CM | POA: Diagnosis not present

## 2018-05-09 DIAGNOSIS — Z6838 Body mass index (BMI) 38.0-38.9, adult: Secondary | ICD-10-CM | POA: Diagnosis not present

## 2018-06-14 ENCOUNTER — Telehealth: Payer: Self-pay | Admitting: Cardiology

## 2018-06-14 MED ORDER — LISINOPRIL 2.5 MG PO TABS: 5.0000 mg | ORAL_TABLET | Freq: Every day | ORAL | 2 refills | Status: DC

## 2018-06-14 NOTE — Telephone Encounter (Signed)
°*  STAT* If patient is at the pharmacy, call can be transferred to refill team.   1. Which medications need to be refilled? lisinopril (PRINIVIL,ZESTRIL) 2.5 MG tablet     2. Which pharmacy/location (including street and city if local pharmacy) is medication to be sent to? [facility] [place] -   3. Do they need a 30 day or 90 day supply?  Would like 90

## 2018-06-14 NOTE — Telephone Encounter (Signed)
Rx sent 

## 2018-09-02 ENCOUNTER — Other Ambulatory Visit: Payer: Self-pay | Admitting: Cardiology

## 2018-09-07 ENCOUNTER — Other Ambulatory Visit: Payer: Self-pay | Admitting: Cardiology

## 2018-11-21 DIAGNOSIS — Z6833 Body mass index (BMI) 33.0-33.9, adult: Secondary | ICD-10-CM | POA: Diagnosis not present

## 2018-11-21 DIAGNOSIS — E8881 Metabolic syndrome: Secondary | ICD-10-CM | POA: Diagnosis not present

## 2018-11-21 DIAGNOSIS — E782 Mixed hyperlipidemia: Secondary | ICD-10-CM | POA: Diagnosis not present

## 2018-12-14 DIAGNOSIS — E782 Mixed hyperlipidemia: Secondary | ICD-10-CM | POA: Diagnosis not present

## 2018-12-14 DIAGNOSIS — R61 Generalized hyperhidrosis: Secondary | ICD-10-CM | POA: Diagnosis not present

## 2018-12-14 DIAGNOSIS — E8881 Metabolic syndrome: Secondary | ICD-10-CM | POA: Diagnosis not present

## 2019-03-21 ENCOUNTER — Other Ambulatory Visit: Payer: Self-pay | Admitting: Cardiology

## 2019-03-22 ENCOUNTER — Telehealth: Payer: Self-pay | Admitting: Cardiology

## 2019-03-22 NOTE — Telephone Encounter (Signed)
Needing refill on lisinopril (ZESTRIL) 2.5 MG tablet [[id number]]  [facility] [place]   Please let pt's wife know when refill has been sent

## 2019-03-22 NOTE — Telephone Encounter (Signed)
Medication sent to pharmacy  

## 2019-04-11 ENCOUNTER — Encounter: Payer: Self-pay | Admitting: *Deleted

## 2019-04-11 ENCOUNTER — Encounter: Payer: Self-pay | Admitting: Cardiology

## 2019-04-11 ENCOUNTER — Ambulatory Visit (INDEPENDENT_AMBULATORY_CARE_PROVIDER_SITE_OTHER): Payer: BC Managed Care – PPO | Admitting: Cardiology

## 2019-04-11 VITALS — BP 130/74 | HR 55 | Temp 98.2°F | Ht 71.0 in | Wt 218.2 lb

## 2019-04-11 DIAGNOSIS — I10 Essential (primary) hypertension: Secondary | ICD-10-CM

## 2019-04-11 DIAGNOSIS — I25119 Atherosclerotic heart disease of native coronary artery with unspecified angina pectoris: Secondary | ICD-10-CM | POA: Diagnosis not present

## 2019-04-11 DIAGNOSIS — E782 Mixed hyperlipidemia: Secondary | ICD-10-CM | POA: Diagnosis not present

## 2019-04-11 NOTE — Patient Instructions (Addendum)

## 2019-04-11 NOTE — Progress Notes (Signed)
Clinical Summary Mr. Dirr is a 48 y.o.male seen today for follow up of the following medical problems.   1. CAD - admit 07/2015 with NSTEMI. Found to have totally occluded RCA, as well as significant LCX and OM2 disease. S/p DES to LCX and OM2, RCA occlusion managed medically. - 10/2015 echo LVEF 55-60%, no WMAs - treated for post-MI pericarditis with colchicine and ASA/NSAIDs for a period -he continued to have atypical chest pain. Right sided, worst with position, worst with deep breathing. Constant pain lasting days at a time.  - since last visit repeat ESR and CRP have normalized. CT chest looked good, had some gallstones without cholelithiasis.Marland Kitchen ALT mildly elevated at 74, other liver tests normal.  -had his gallbladder removed, his chest pain symptoms resolved at that time    03/2017 nuclear stress: duke treadmill score of 9, inferior scar with very mild peri-infarct ischemia, low risk. Correlates with known RCA CTO.  - did not tolerate imdur. - last visit started on low dose beta blocker (had not been on in the past due to low HRs which improved last visit)  - mild chest pains at times with strenous exercise. Overall better than previous.    2. Hyperlipidemia - he is compliant with statin  3. HTN - home bp's 130s/80s - compliant with meds    Past Medical History:  Diagnosis Date  . CAD (coronary artery disease)    cath 9/26 & 07/31/2015 100% total occlusion of post atrio Vela Render of RCA, LV supplied by collaterals from the L atrial recurrent Gemma Ruan of distal LCx, 75-80% mid to distal LCx stenosis, high grade complex stenosis of OM2, patent LAD and RI, EF 55-60%. On 9/8, single DES to midLCx extending into OM2.  . Headache    "weekly" (07/29/2015)  . Hypercholesteremia   . NSTEMI (non-ST elevated myocardial infarction) ([place]) 07/28/2015     No Known Allergies   Current Outpatient Medications  Medication Sig Dispense Refill  . aspirin 81 MG chewable tablet Chew  1 tablet (81 mg total) by mouth daily.    Marland Kitchen lisinopril (ZESTRIL) 2.5 MG tablet TAKE 2 TABLETS BY MOUTH DAILY 180 tablet 2  . nitroGLYCERIN (NITROSTAT) 0.4 MG SL tablet Place 1 tablet (0.4 mg total) under the tongue every 5 (five) minutes as needed for chest pain. 25 tablet 3  . pantoprazole (PROTONIX) 40 MG tablet Take 40 mg by mouth daily.    . rosuvastatin (CRESTOR) 40 MG tablet TAKE 1 TABLET BY MOUTH EVERY DAY. NEED APPT FOR FURTHER REFILLS 90 tablet 2  . testosterone cypionate (DEPOTESTOSTERONE CYPIONATE) 200 MG/ML injection Inject 1 mL into the muscle every 14 (fourteen) days.    . metoprolol tartrate (LOPRESSOR) 25 MG tablet TAKE 1 TABLET BY MOUTH TWICE A DAY 180 tablet 1   No current facility-administered medications for this visit.      Past Surgical History:  Procedure Laterality Date  . CARDIAC CATHETERIZATION  2006; 07/29/2015  . CARDIAC CATHETERIZATION N/A 07/29/2015   Procedure: Left Heart Cath and Coronary Angiography;  Surgeon: Belva Crome, MD;  Location: [facility];  Service: Cardiovascular;  Laterality: N/A;  . CARDIAC CATHETERIZATION N/A 07/31/2015   Procedure: Coronary Stent Intervention;  Surgeon: Wellington Hampshire, MD;  Location: [facility];  Service: Cardiovascular;  Laterality: N/A;  . CARDIAC CATHETERIZATION N/A 07/31/2015   Procedure: Left Heart Cath and Coronary Angiography;  Surgeon: Wellington Hampshire, MD;  Location: [facility];  Service: Cardiovascular;  Laterality: N/A;  . CHOLECYSTECTOMY    . TONSILLECTOMY  ~ 1989  . WISDOM TOOTH EXTRACTION  1993     No Known Allergies    Family History  Problem Relation Age of Onset  . Cancer Father      Social History Mr. Bubeck reports that he has been smoking cigarettes. He has a 5.00 pack-year smoking history. He has quit using smokeless tobacco. Mr. Fitzgerald reports current alcohol use.   Review of Systems CONSTITUTIONAL: No weight loss, fever, chills, weakness or fatigue.  HEENT: Eyes:  No visual loss, blurred vision, double vision or yellow sclerae.No hearing loss, sneezing, congestion, runny nose or sore throat.  SKIN: No rash or itching.  CARDIOVASCULAR: per hpi RESPIRATORY: No shortness of breath, cough or sputum.  GASTROINTESTINAL: No anorexia, nausea, vomiting or diarrhea. No abdominal pain or blood.  GENITOURINARY: No burning on urination, no polyuria NEUROLOGICAL: No headache, dizziness, syncope, paralysis, ataxia, numbness or tingling in the extremities. No change in bowel or bladder control.  MUSCULOSKELETAL: No muscle, back pain, joint pain or stiffness.  LYMPHATICS: No enlarged nodes. No history of splenectomy.  PSYCHIATRIC: No history of depression or anxiety.  ENDOCRINOLOGIC: No reports of sweating, cold or heat intolerance. No polyuria or polydipsia.  Marland Kitchen   Physical Examination Vitals:   04/11/19 1504  BP: 130/74  Pulse: (!) 55  Temp: 98.2 F (36.8 C)  SpO2: 99%   Filed Weights   04/11/19 1504  Weight: 218 lb 3.2 oz (99 kg)    Gen: resting comfortably, no acute distress HEENT: no scleral icterus, pupils equal round and reactive, no palptable cervical adenopathy,  CV: RRR, no m/r,g, no jvd Resp: Clear to auscultation bilaterally GI: abdomen is soft, non-tender, non-distended, normal bowel sounds, no hepatosplenomegaly MSK: extremities are warm, no edema.  Skin: warm, no rash Neuro:  no focal deficits Psych: appropriate affect   Diagnostic Studies 07/2015 cath  Ost 2nd Mrg to 2nd Mrg lesion, 85% stenosed.  Prox Cx lesion, 75% stenosed.  Post Atrio lesion, 100% stenosed.  Ost LAD to Prox LAD lesion, 30% stenosed.  Mid RCA to Dist RCA lesion, 40% stenosed.   Recent acute inferolateral infarction now greater than 18 hours old. Angiography demonstrates total occlusion of the RCA beyond the origin of the PDA. The left ventricular Shirlene Andaya is supplied by collaterals from the left atrial recurrent Tamarius Rosenfield of the distal circumflex.  75-80% mid  to distal circumflex stenosis. High-grade complex stenosis in the second obtuse marginal Syncere Eble which is small to moderate in size.  Widely patent LAD and a dominant branching ramus intermedius.  Overall normal LV function with EF 55-60%, and inferobasal hypokinesis.   07/2015 PCI  Post Atrio lesion, 100% stenosed.  Ost LAD to Prox LAD lesion, 30% stenosed.  Mid RCA to Dist RCA lesion, 40% stenosed.  Ost 2nd Mrg to 2nd Mrg lesion, 90% stenosed. There is a 0% residual stenosis post intervention.  A drug-eluting stent was placed.  Prox Cx lesion, 75% stenosed. There is a 0% residual stenosis post intervention.  1. Continued occlusion of the right posterior AV groove artery with left to right collaterals. Severe mid left circumflex stenosis into OM 2. 2. Mildly elevated left ventricular end-diastolic pressure. 3. Successful angioplasty and drug-eluting stent placement to the mid left circumflex extending into OM 2. I used one long stent which covered both lesions.  03/2017 nuclear stress  Blood pressure demonstrated a normal response to exercise. Duke treadmill score of 9 consistent with low risk for  major cardiac events  There was no ST segment deviation noted during stress.  Findings consistent with prior inferior myocardial infarction with very mild peri-infarct ischemia.  This is a low risk study. Particularly when considering good functional capacity and low risk Duke treadmill score.  The left ventricular ejection fraction is normal (55-65%).    Assessment and Plan   1. CAD with stable angina - Nuclear stress test overall low risk - mild chest pain only with high levels of exertion, improved with beta blocker. He reports the mild symptoms are tolerable at this time - continue current meds - EKG today shows SR, no ischemic changes  2. Hyperlipidemia - he will continue statin  3. HTN - at goal based on home numbers continue current meds    [name], M.D.

## 2019-04-21 ENCOUNTER — Other Ambulatory Visit: Payer: Self-pay | Admitting: Cardiology

## 2019-04-25 DIAGNOSIS — E8881 Metabolic syndrome: Secondary | ICD-10-CM | POA: Diagnosis not present

## 2019-04-25 DIAGNOSIS — I2510 Atherosclerotic heart disease of native coronary artery without angina pectoris: Secondary | ICD-10-CM | POA: Diagnosis not present

## 2019-04-25 DIAGNOSIS — E782 Mixed hyperlipidemia: Secondary | ICD-10-CM | POA: Diagnosis not present

## 2019-04-25 DIAGNOSIS — I10 Essential (primary) hypertension: Secondary | ICD-10-CM | POA: Diagnosis not present

## 2019-04-25 DIAGNOSIS — E291 Testicular hypofunction: Secondary | ICD-10-CM | POA: Diagnosis not present

## 2019-05-25 ENCOUNTER — Other Ambulatory Visit: Payer: Self-pay | Admitting: Cardiology

## 2019-06-26 DIAGNOSIS — M84333A Stress fracture, right radius, initial encounter for fracture: Secondary | ICD-10-CM | POA: Diagnosis not present

## 2019-06-29 DIAGNOSIS — M7989 Other specified soft tissue disorders: Secondary | ICD-10-CM | POA: Diagnosis not present

## 2019-06-29 DIAGNOSIS — S6991XA Unspecified injury of right wrist, hand and finger(s), initial encounter: Secondary | ICD-10-CM | POA: Diagnosis not present

## 2019-08-14 DIAGNOSIS — M25531 Pain in right wrist: Secondary | ICD-10-CM | POA: Diagnosis not present

## 2019-08-14 DIAGNOSIS — S63591D Other specified sprain of right wrist, subsequent encounter: Secondary | ICD-10-CM | POA: Diagnosis not present

## 2019-09-26 DIAGNOSIS — K219 Gastro-esophageal reflux disease without esophagitis: Secondary | ICD-10-CM | POA: Diagnosis not present

## 2019-09-26 DIAGNOSIS — E291 Testicular hypofunction: Secondary | ICD-10-CM | POA: Diagnosis not present

## 2019-09-26 DIAGNOSIS — E782 Mixed hyperlipidemia: Secondary | ICD-10-CM | POA: Diagnosis not present

## 2019-09-26 DIAGNOSIS — I10 Essential (primary) hypertension: Secondary | ICD-10-CM | POA: Diagnosis not present

## 2019-09-27 ENCOUNTER — Telehealth: Payer: Self-pay | Admitting: Cardiology

## 2019-09-27 NOTE — Telephone Encounter (Signed)
Received a voicemail from the patients wife Larene Beach--  Wife called stating that the patient is currently having chest pain. Pt is not in town, he's at the beach for the holiday.  Pt's wife states that he's sure it's not a heart attack, that he thinks it a flair up of his pericarditis and wants to be referred to someone down there by Dr. Harl Bowie to get checked out and would prefer to not go to the ER due to the risk of COVID exposure.  Please call Larene Beach @ [phone]

## 2019-09-27 NOTE — Telephone Encounter (Signed)
Pt c/o chest pain that is described as "the feeling of pericarditis" and not heart attack - chest pain has been constant  since yesterday - rating pain 7/10 - denies SOB/dizziness/swelling/sweating - doesn't have a way to check BP/HR - hasn't taken NTG but has been taking ibuprofen - pt and wife currently at the beach for the holidays and wife will take pt to local urgent care for evaluation

## 2019-11-20 ENCOUNTER — Telehealth: Payer: Self-pay

## 2019-11-20 ENCOUNTER — Encounter: Payer: Self-pay | Admitting: Cardiology

## 2019-11-20 ENCOUNTER — Telehealth (INDEPENDENT_AMBULATORY_CARE_PROVIDER_SITE_OTHER): Payer: BC Managed Care – PPO | Admitting: Cardiology

## 2019-11-20 VITALS — BP 125/80 | Ht 70.0 in | Wt 212.0 lb

## 2019-11-20 DIAGNOSIS — I10 Essential (primary) hypertension: Secondary | ICD-10-CM

## 2019-11-20 DIAGNOSIS — E782 Mixed hyperlipidemia: Secondary | ICD-10-CM | POA: Diagnosis not present

## 2019-11-20 DIAGNOSIS — I25118 Atherosclerotic heart disease of native coronary artery with other forms of angina pectoris: Secondary | ICD-10-CM

## 2019-11-20 MED ORDER — METOPROLOL TARTRATE 25 MG PO TABS: 37.5000 mg | ORAL_TABLET | Freq: Two times a day (BID) | ORAL | 1 refills | Status: DC

## 2019-11-20 NOTE — Telephone Encounter (Signed)
.   Virtual Visit Pre-Appointment Phone Call  "(Name), I am calling you today to discuss your upcoming appointment. We are currently trying to limit exposure to the virus that causes COVID-19 by seeing patients at home rather than in the office."  "What is the BEST phone number to call the day of the visit?" - include this in appointment notes  "Do you have or have access to (through a family member/friend) a smartphone with video capability that we can use for your visit?" If yes - list this number in appt notes as "cell" (if different from BEST phone #) and list the appointment type as a VIDEO visit in appointment notes If no - list the appointment type as a PHONE visit in appointment notes  Confirm consent - "In the setting of the current Covid19 crisis, you are scheduled for a (phone or video) visit with your provider on (date) at (time).  Just as we do with many in-office visits, in order for you to participate in this visit, we must obtain consent.  If you'd like, I can send this to your mychart (if signed up) or email for you to review.  Otherwise, I can obtain your verbal consent now.  All virtual visits are billed to your insurance company just like a normal visit would be.  By agreeing to a virtual visit, we'd like you to understand that the technology does not allow for your provider to perform an examination, and thus may limit your provider's ability to fully assess your condition. If your provider identifies any concerns that need to be evaluated in person, we will make arrangements to do so.  Finally, though the technology is pretty good, we cannot assure that it will always work on either your or our end, and in the setting of a video visit, we may have to convert it to a phone-only visit.  In either situation, we cannot ensure that we have a secure connection.  Are you willing to proceed?" STAFF: Did the patient verbally acknowledge consent to telehealth visit? Document YES/NO here:   Advise patient to be prepared - "Two hours prior to your appointment, go ahead and check your blood pressure, pulse, oxygen saturation, and your weight (if you have the equipment to check those) and write them all down. When your visit starts, your provider will ask you for this information. If you have an Apple Watch or Kardia device, please plan to have heart rate information ready on the day of your appointment. Please have a pen and paper handy nearby the day of the visit as well."  Give patient instructions for MyChart download to smartphone OR Doximity/Doxy.me as below if video visit (depending on what platform provider is using)  Inform patient they will receive a phone call 15 minutes prior to their appointment time (may be from unknown caller ID) so they should be prepared to answer    TELEPHONE CALL NOTE  Alex Barnes has been deemed a candidate for a follow-up tele-health visit to limit community exposure during the Covid-19 pandemic. I spoke with the patient via phone to ensure availability of phone/video source, confirm preferred email & phone number, and discuss instructions and expectations.  I reminded Alex Barnes to be prepared with any vital sign and/or heart rhythm information that could potentially be obtained via home monitoring, at the time of his visit. I reminded Alex Barnes to expect a phone call prior to his visit.  [name] 11/20/2019 10:58 AM  INSTRUCTIONS FOR DOWNLOADING THE MYCHART APP TO SMARTPHONE  - The patient must first make sure to have activated MyChart and know their login information - If Apple, go to [employer] and type in MyChart in the search bar and download the app. If Android, ask patient to go to [employer] and type in [place] in the search bar and download the app. The app is free but as with any other app downloads, their phone may require them to verify saved payment information or Apple/Android password.  - The patient  will need to then log into the app with their MyChart username and password, and select [facility] as their healthcare provider to link the account. When it is time for your visit, go to the MyChart app, find appointments, and click Begin Video Visit. Be sure to Select Allow for your device to access the Microphone and Camera for your visit. You will then be connected, and your provider will be with you shortly.  **If they have any issues connecting, or need assistance please contact MyChart service desk [phone])**  **If using a computer, in order to ensure the best quality for their visit they will need to use either of the following Internet Browsers: [employer], or Google Chrome**  IF USING DOXIMITY or DOXY.ME - The patient will receive a link just prior to their visit by text.     FULL LENGTH CONSENT FOR TELE-HEALTH VISIT   I hereby voluntarily request, consent and authorize [facility] and its employed or contracted physicians, physician assistants, nurse practitioners or other licensed health care professionals (the Practitioner), to provide me with telemedicine health care services (the "Services") as deemed necessary by the treating Practitioner. I acknowledge and consent to receive the Services by the Practitioner via telemedicine. I understand that the telemedicine visit will involve communicating with the Practitioner through live audiovisual communication technology and the disclosure of certain medical information by electronic transmission. I acknowledge that I have been given the opportunity to request an in-person assessment or other available alternative prior to the telemedicine visit and am voluntarily participating in the telemedicine visit.  I understand that I have the right to withhold or withdraw my consent to the use of telemedicine in the course of my care at any time, without affecting my right to future care or treatment, and that the Practitioner  or I may terminate the telemedicine visit at any time. I understand that I have the right to inspect all information obtained and/or recorded in the course of the telemedicine visit and may receive copies of available information for a reasonable fee.  I understand that some of the potential risks of receiving the Services via telemedicine include:  Delay or interruption in medical evaluation due to technological equipment failure or disruption; Information transmitted may not be sufficient (e.g. poor resolution of images) to allow for appropriate medical decision making by the Practitioner; and/or  In rare instances, security protocols could fail, causing a breach of personal health information.  Furthermore, I acknowledge that it is my responsibility to provide information about my medical history, conditions and care that is complete and accurate to the best of my ability. I acknowledge that Practitioner's advice, recommendations, and/or decision may be based on factors not within their control, such as incomplete or inaccurate data provided by me or distortions of diagnostic images or specimens that may result from electronic transmissions. I understand that the practice of medicine is not an exact science and that Practitioner  makes no warranties or guarantees regarding treatment outcomes. I acknowledge that I will receive a copy of this consent concurrently upon execution via email to the email address I last provided but may also request a printed copy by calling the office of [facility].    I understand that my insurance will be billed for this visit.   I have read or had this consent read to me. I understand the contents of this consent, which adequately explains the benefits and risks of the Services being provided via telemedicine.  I have been provided ample opportunity to ask questions regarding this consent and the Services and have had my questions answered to my satisfaction. I give  my informed consent for the services to be provided through the use of telemedicine in my medical care  By participating in this telemedicine visit I agree to the above.

## 2019-11-20 NOTE — Addendum Note (Signed)
Addended by: Abelino Derrick R on: 11/20/2019 04:20 PM   Modules accepted: Orders

## 2019-11-20 NOTE — Patient Instructions (Signed)
Medication Instructions:  INCREASE LOPRESSOR TO 37.5 MG (1 1/2 TABLETS) TWO TIMES DAILY   Labwork: I WILL REQUEST LABS FROM PCP  Testing/Procedures: NONE  Follow-Up: Your physician wants you to follow-up in: 6 MONTHS.  You will receive a reminder letter in the mail two months in advance. If you don't receive a letter, please call our office to schedule the follow-up appointment.   Any Other Special Instructions Will Be Listed Below (If Applicable).     If you need a refill on your cardiac medications before your next appointment, please call your pharmacy.

## 2019-11-20 NOTE — Progress Notes (Signed)
Virtual Visit via Telephone Note   This visit type was conducted due to national recommendations for restrictions regarding the COVID-19 Pandemic (e.g. social distancing) in an effort to limit this patient's exposure and mitigate transmission in our community.  Due to his co-morbid illnesses, this patient is at least at moderate risk for complications without adequate follow up.  This format is felt to be most appropriate for this patient at this time.  The patient did not have access to video technology/had technical difficulties with video requiring transitioning to audio format only (telephone).  All issues noted in this document were discussed and addressed.  No physical exam could be performed with this format.  Please refer to the patient's chart for his  consent to telehealth for [facility].   Date:  11/20/2019   ID:  Alex Barnes, DOB 13-Dec-1970, MRN [id number]  Patient Location: Home Provider Location: Office  PCP:  Richardean Chimera, MD  Cardiologist:  Dina Rich, MD  Electrophysiologist:  None   Evaluation Performed:  Follow-Up Visit  Chief Complaint:  Follow up  History of Present Illness:    Alex Barnes is a 49 y.o. male seen today for follow up of the following medical problems.   1. CAD - admit 07/2015 with NSTEMI. Found to have totally occluded RCA, as well as significant LCX and OM2 disease. S/p DES to LCX and OM2, RCA occlusion managed medically. - 10/2015 echo LVEF 55-60%, no WMAs - treated for post-MI pericarditis with colchicine and ASA/NSAIDs for a period -he continued to have atypical chest pain. Right sided, worst with position, worst with deep breathing. Constant pain lasting days at a time.  -had his gallbladder removed, his chest pain symptoms resolved at that time    03/2017 nuclear stress: duke treadmill score of 9, inferior scar with very mild peri-infarct ischemia, low risk. Correlates with known RCA CTO. -did not tolerate imdur. -  last visit started on low dose beta blocker (had not been on in the past due to low HRs which improved last visit)   - some chest pains with heavy exercise, overall stable.    2. Hyperlipidemia - recent labs with pcp - compliant with statin  3. HTN - compliant with meds   Recent COVID diagnosed around Thanksgiving. Reports mild course. Wife and daughter had mild courses as well    The patient does not have symptoms concerning for COVID-19 infection (fever, chills, cough, or new shortness of breath).    Past Medical History:  Diagnosis Date  . CAD (coronary artery disease)    cath 9/26 & 07/31/2015 100% total occlusion of post atrio Marcy Bogosian of RCA, LV supplied by collaterals from the L atrial recurrent Lauralei Clouse of distal LCx, 75-80% mid to distal LCx stenosis, high grade complex stenosis of OM2, patent LAD and RI, EF 55-60%. On 9/8, single DES to midLCx extending into OM2.  . Headache    "weekly" (07/29/2015)  . Hypercholesteremia   . NSTEMI (non-ST elevated myocardial infarction) (HCC) 07/28/2015   Past Surgical History:  Procedure Laterality Date  . CARDIAC CATHETERIZATION  2006; 07/29/2015  . CARDIAC CATHETERIZATION N/A 07/29/2015   Procedure: Left Heart Cath and Coronary Angiography;  Surgeon: Lyn Records, MD;  Location: [facility] INVASIVE CV LAB;  Service: Cardiovascular;  Laterality: N/A;  . CARDIAC CATHETERIZATION N/A 07/31/2015   Procedure: Coronary Stent Intervention;  Surgeon: Iran Ouch, MD;  Location: [facility] INVASIVE CV LAB;  Service: Cardiovascular;  Laterality: N/A;  . CARDIAC CATHETERIZATION  N/A 07/31/2015   Procedure: Left Heart Cath and Coronary Angiography;  Surgeon: Wellington Hampshire, MD;  Location: [facility] CV LAB;  Service: Cardiovascular;  Laterality: N/A;  . CHOLECYSTECTOMY    . TONSILLECTOMY  ~ 1989  . WISDOM TOOTH EXTRACTION  1993     Current Meds  Medication Sig  . aspirin 81 MG chewable tablet Chew 1 tablet (81 mg total) by mouth daily.  Marland Kitchen lisinopril  (ZESTRIL) 2.5 MG tablet TAKE 2 TABLETS BY MOUTH DAILY  . metoprolol tartrate (LOPRESSOR) 25 MG tablet TAKE 1 TABLET BY MOUTH TWICE A DAY  . nitroGLYCERIN (NITROSTAT) 0.4 MG SL tablet Place 1 tablet (0.4 mg total) under the tongue every 5 (five) minutes as needed for chest pain.  . pantoprazole (PROTONIX) 40 MG tablet Take 40 mg by mouth daily.  . rosuvastatin (CRESTOR) 40 MG tablet TAKE 1 TABLET BY MOUTH EVERY DAY. NEED APPT FOR FURTHER REFILLS  . testosterone cypionate (DEPOTESTOSTERONE CYPIONATE) 200 MG/ML injection Inject 1 mL into the muscle every 14 (fourteen) days.     Allergies:   Patient has no known allergies.   Social History   Tobacco Use  . Smoking status: Current Some Day Smoker    Packs/day: 0.50    Years: 10.00    Pack years: 5.00    Types: Cigarettes    Last attempt to quit: 11/07/1996    Years since quitting: 23.0  . Smokeless tobacco: Former Systems developer  . Tobacco comment: "quit smoking cigarettes in 1998"  Substance Use Topics  . Alcohol use: Yes    Alcohol/week: 0.0 standard drinks    Comment: 6 drinks weekly   . Drug use: No     Family Hx: The patient's family history includes Cancer in his father.  ROS:   Please see the history of present illness.     All other systems reviewed and are negative.   Prior CV studies:   The following studies were reviewed today:  07/2015 cath  Ost 2nd Mrg to 2nd Mrg lesion, 85% stenosed.  Prox Cx lesion, 75% stenosed.  Post Atrio lesion, 100% stenosed.  Ost LAD to Prox LAD lesion, 30% stenosed.  Mid RCA to Dist RCA lesion, 40% stenosed.   Recent acute inferolateral infarction now greater than 18 hours old. Angiography demonstrates total occlusion of the RCA beyond the origin of the PDA. The left ventricular Jarmarcus Wambold is supplied by collaterals from the left atrial recurrent Shameika Speelman of the distal circumflex.  75-80% mid to distal circumflex stenosis. High-grade complex stenosis in the second obtuse marginal Christalyn Goertz which  is small to moderate in size.  Widely patent LAD and a dominant branching ramus intermedius.  Overall normal LV function with EF 55-60%, and inferobasal hypokinesis.   07/2015 PCI  Post Atrio lesion, 100% stenosed.  Ost LAD to Prox LAD lesion, 30% stenosed.  Mid RCA to Dist RCA lesion, 40% stenosed.  Ost 2nd Mrg to 2nd Mrg lesion, 90% stenosed. There is a 0% residual stenosis post intervention.  A drug-eluting stent was placed.  Prox Cx lesion, 75% stenosed. There is a 0% residual stenosis post intervention.  1. Continued occlusion of the right posterior AV groove artery with left to right collaterals. Severe mid left circumflex stenosis into OM 2. 2. Mildly elevated left ventricular end-diastolic pressure. 3. Successful angioplasty and drug-eluting stent placement to the mid left circumflex extending into OM 2. I used one long stent which covered both lesions.  03/2017 nuclear stress  Blood pressure demonstrated a normal  response to exercise. Duke treadmill score of 9 consistent with low risk for major cardiac events  There was no ST segment deviation noted during stress.  Findings consistent with prior inferior myocardial infarction with very mild peri-infarct ischemia.  This is a low risk study. Particularly when considering good functional capacity and low risk Duke treadmill score.  The left ventricular ejection fraction is normal (55-65%).    Labs/Other Tests and Data Reviewed:    EKG:  No ECG reviewed.  Recent Labs: No results found for requested labs within last 8760 hours.   Recent Lipid Panel No results found for: CHOL, TRIG, HDL, CHOLHDL, LDLCALC, LDLDIRECT  Wt Readings from Last 3 Encounters:  11/20/19 212 lb (96.2 kg)  04/11/19 218 lb 3.2 oz (99 kg)  03/03/18 278 lb (126.1 kg)     Objective:    Vital Signs:  BP 125/80   Ht 5\' 10"  (1.778 m)   Wt 212 lb (96.2 kg)   BMI 30.42 kg/m    Normal affect. Normal speech pattern and tone. Comfortable,  no apparent distress. No audible signs of SOB or wheezing.   ASSESSMENT & PLAN:    1. CAD with stable angina - Nuclear stress test overall low risk - mild chest pains only at high levels of activity. Will increase lopressor to 37.5mg  bid and monitor. Based on nuclear imaging like some ischemia related to his RCA CTO.   2. Hyperlipidemia - request labs from pcp, continue statin  3. HTN - he is at goal, continue current meds   COVID-19 Education: The signs and symptoms of COVID-19 were discussed with the patient and how to seek care for testing (follow up with PCP or arrange E-visit).  The importance of social distancing was discussed today.  Time:   Today, I have spent 21 minutes with the patient with telehealth technology discussing the above problems.     Medication Adjustments/Labs and Tests Ordered: Current medicines are reviewed at length with the patient today.  Concerns regarding medicines are outlined above.   Tests Ordered: No orders of the defined types were placed in this encounter.   Medication Changes: No orders of the defined types were placed in this encounter.   Follow Up:  Either In Person or Virtual in 6 month(s)  Signed, , MD  11/20/2019 4:02 PM

## 2020-01-02 ENCOUNTER — Other Ambulatory Visit: Payer: Self-pay | Admitting: Cardiology

## 2020-01-22 ENCOUNTER — Telehealth: Payer: Self-pay | Admitting: Cardiology

## 2020-01-22 MED ORDER — METOPROLOL TARTRATE 25 MG PO TABS: 37.5000 mg | ORAL_TABLET | Freq: Two times a day (BID) | ORAL | 3 refills | Status: DC

## 2020-01-22 NOTE — Telephone Encounter (Signed)
Covermymeds completed for increased qty. Supply of Lopressor.

## 2020-01-22 NOTE — Telephone Encounter (Signed)
Pt is out of his metoprolol tartrate (LOPRESSOR) 25 MG tablet [[id number]]  And is needing a refill sent in today--   Dr. Wyline Mood up'd his dose and he gets a 90 supply but he's not eligible for a refill because the 90 days aren't up yet.  Please pt's wife Carollee Herter w/ any questions @ [phone]

## 2020-01-22 NOTE — Telephone Encounter (Signed)
90 day supply sent to [facility] pharmacy in [place]

## 2020-01-25 DIAGNOSIS — Z23 Encounter for immunization: Secondary | ICD-10-CM | POA: Diagnosis not present

## 2020-02-05 ENCOUNTER — Other Ambulatory Visit: Payer: Self-pay | Admitting: Cardiology

## 2020-02-28 DIAGNOSIS — I10 Essential (primary) hypertension: Secondary | ICD-10-CM | POA: Diagnosis not present

## 2020-02-28 DIAGNOSIS — E291 Testicular hypofunction: Secondary | ICD-10-CM | POA: Diagnosis not present

## 2020-02-28 DIAGNOSIS — K219 Gastro-esophageal reflux disease without esophagitis: Secondary | ICD-10-CM | POA: Diagnosis not present

## 2020-02-28 DIAGNOSIS — Z0001 Encounter for general adult medical examination with abnormal findings: Secondary | ICD-10-CM | POA: Diagnosis not present

## 2020-02-28 DIAGNOSIS — R946 Abnormal results of thyroid function studies: Secondary | ICD-10-CM | POA: Diagnosis not present

## 2020-02-28 DIAGNOSIS — E782 Mixed hyperlipidemia: Secondary | ICD-10-CM | POA: Diagnosis not present

## 2020-03-01 DIAGNOSIS — Z23 Encounter for immunization: Secondary | ICD-10-CM | POA: Diagnosis not present

## 2020-04-08 ENCOUNTER — Ambulatory Visit: Payer: BC Managed Care – PPO | Admitting: Cardiology

## 2020-04-08 ENCOUNTER — Encounter: Payer: Self-pay | Admitting: Cardiology

## 2020-04-08 ENCOUNTER — Other Ambulatory Visit: Payer: Self-pay

## 2020-04-08 ENCOUNTER — Encounter: Payer: Self-pay | Admitting: *Deleted

## 2020-04-08 VITALS — BP 120/64 | HR 75 | Ht 70.0 in | Wt 233.4 lb

## 2020-04-08 DIAGNOSIS — I25118 Atherosclerotic heart disease of native coronary artery with other forms of angina pectoris: Secondary | ICD-10-CM | POA: Diagnosis not present

## 2020-04-08 MED ORDER — METOPROLOL TARTRATE 50 MG PO TABS
50.0000 mg | ORAL_TABLET | Freq: Two times a day (BID) | ORAL | 1 refills | Status: DC
Start: 2020-04-08 — End: 2020-10-28

## 2020-04-08 NOTE — Patient Instructions (Signed)
Your physician recommends that you schedule a follow-up appointment in: 6 WEEKS WITH DR [place] TELEHEALTH  Your physician has recommended you make the following change in your medication:   INCREASE LOPRESSOR 50 MG TWICE DAILY   Thank you for choosing [facility]!!

## 2020-04-08 NOTE — Progress Notes (Signed)
Clinical Summary Mr. Leiner is a 49 y.o.male seen today for follow up of the following medical problems. Focused visit on history of CAD and recent chest pain and hospital admission.   1. CAD - admit 07/2015 with NSTEMI. Found to have totally occluded RCA, as well as significant LCX and OM2 disease. S/p DES to LCX and OM2, RCA occlusion managed medically. - 10/2015 echo LVEF 55-60%, no WMAs - treated for post-MI pericarditis with colchicine and ASA/NSAIDs for a period -he continued to have atypical chest pain. Right sided, worst with position, worst with deep breathing. Constant pain lasting days at a time.  -had his gallbladder removed, his chest pain symptoms resolved at that time    03/2017 nuclear stress: duke treadmill score of 9, inferior scar with very mild peri-infarct ischemia, low risk. Correlates with known RCA CTO.      - prior chest pains with heavy exercise. Higher lopressor dose in January improved it - gradual increase in symptoms over the last few months - while in [facility], progression of symptoms. Went in [facility] in [place]. Enzymes normal - had heart cath, no new disease per verbal report. Tried to open RCA CTO but unsuccesful   - started on imdur 15mg  daily, tolerating ok. Previously did not tolerate  - pain left sided, tightness with some pain with heart beats. 8/10 in severity. Mild dizziness, no there associated symptoms. Pain would last minutes at time. Worst with exertion, and still so. Can be worst with deep breathing. Similar to prior angina. - limiting exertion.   2. Hyperlipidemia -recent labs with pcp - compliant with statin  3. HTN - compliant with meds   Recent COVID diagnosed around Thanksgiving. Reports mild course. Wife and daughter had mild courses as well  SH: has condo in [facility]   Past Medical History:  Diagnosis Date  . CAD (coronary artery disease)    cath 9/26 & 07/31/2015 100% total occlusion of post  atrio Juley Giovanetti of RCA, LV supplied by collaterals from the L atrial recurrent Glennda Weatherholtz of distal LCx, 75-80% mid to distal LCx stenosis, high grade complex stenosis of OM2, patent LAD and RI, EF 55-60%. On 9/8, single DES to midLCx extending into OM2.  . Headache    "weekly" (07/29/2015)  . Hypercholesteremia   . NSTEMI (non-ST elevated myocardial infarction) ([place]) 07/28/2015     No Known Allergies   Current Outpatient Medications  Medication Sig Dispense Refill  . aspirin 81 MG chewable tablet Chew 1 tablet (81 mg total) by mouth daily.    Marland Kitchen lisinopril (ZESTRIL) 2.5 MG tablet TAKE 2 TABLETS BY MOUTH EVERY DAY 180 tablet 2  . metoprolol tartrate (LOPRESSOR) 25 MG tablet Take 1.5 tablets (37.5 mg total) by mouth 2 (two) times daily. 270 tablet 3  . nitroGLYCERIN (NITROSTAT) 0.4 MG SL tablet Place 1 tablet (0.4 mg total) under the tongue every 5 (five) minutes as needed for chest pain. 25 tablet 3  . pantoprazole (PROTONIX) 40 MG tablet Take 40 mg by mouth daily.    . rosuvastatin (CRESTOR) 40 MG tablet TAKE 1 TABLET BY MOUTH EVERY DAY. NEED APPT FOR FURTHER REFILLS 90 tablet 2  . testosterone cypionate (DEPOTESTOSTERONE CYPIONATE) 200 MG/ML injection Inject 1 mL into the muscle every 14 (fourteen) days.     No current facility-administered medications for this visit.     Past Surgical History:  Procedure Laterality Date  . CARDIAC CATHETERIZATION  2006; 07/29/2015  . CARDIAC CATHETERIZATION N/A 07/29/2015  Procedure: Left Heart Cath and Coronary Angiography;  Surgeon: Lyn Records, MD;  [facility]: [facility] INVASIVE CV LAB;  Service: Cardiovascular;  Laterality: N/A;  . CARDIAC CATHETERIZATION N/A 07/31/2015   Procedure: Coronary Stent Intervention;  Surgeon: Iran Ouch, MD;  Location: [facility] INVASIVE CV LAB;  Service: Cardiovascular;  Laterality: N/A;  . CARDIAC CATHETERIZATION N/A 07/31/2015   Procedure: Left Heart Cath and Coronary Angiography;  Surgeon: Iran Ouch, MD;  Location: [facility]  INVASIVE CV LAB;  Service: Cardiovascular;  Laterality: N/A;  . CHOLECYSTECTOMY    . TONSILLECTOMY  ~ 1989  . WISDOM TOOTH EXTRACTION  1993     No Known Allergies    Family History  Problem Relation Age of Onset  . Cancer Father      Social History Mr. Marchio reports that he has been smoking cigarettes. He has a 5.00 pack-year smoking history. He has quit using smokeless tobacco. Mr. Correll reports current alcohol use.   Review of Systems CONSTITUTIONAL: No weight loss, fever, chills, weakness or fatigue.  HEENT: Eyes: No visual loss, blurred vision, double vision or yellow sclerae.No hearing loss, sneezing, congestion, runny nose or sore throat.  SKIN: No rash or itching.  CARDIOVASCULAR: per hpi RESPIRATORY: No shortness of breath, cough or sputum.  GASTROINTESTINAL: No anorexia, nausea, vomiting or diarrhea. No abdominal pain or blood.  GENITOURINARY: No burning on urination, no polyuria NEUROLOGICAL: No headache, dizziness, syncope, paralysis, ataxia, numbness or tingling in the extremities. No change in bowel or bladder control.  MUSCULOSKELETAL: No muscle, back pain, joint pain or stiffness.  LYMPHATICS: No enlarged nodes. No history of splenectomy.  PSYCHIATRIC: No history of depression or anxiety.  ENDOCRINOLOGIC: No reports of sweating, cold or heat intolerance. No polyuria or polydipsia.  Marland Kitchen   Physical Examination Today's Vitals   04/08/20 1551  BP: 120/64  Pulse: 75  SpO2: 97%  Weight: 233 lb 6.4 oz (105.9 kg)  Height: 5\' 10"  (1.778 m)   Body mass index is 33.49 kg/m.  Gen: resting comfortably, no acute distress HEENT: no scleral icterus, pupils equal round and reactive, no palptable cervical adenopathy,  CV: RRR, no mr/g, no jvd Resp: Clear to auscultation bilaterally GI: abdomen is soft, non-tender, non-distended, normal bowel sounds, no hepatosplenomegaly MSK: extremities are warm, no edema.  Skin: warm, no rash Neuro:  no focal deficits Psych:  appropriate affect   Diagnostic Studies 07/2015 cath  Ost 2nd Mrg to 2nd Mrg lesion, 85% stenosed.  Prox Cx lesion, 75% stenosed.  Post Atrio lesion, 100% stenosed.  Ost LAD to Prox LAD lesion, 30% stenosed.  Mid RCA to Dist RCA lesion, 40% stenosed.   Recent acute inferolateral infarction now greater than 18 hours old. Angiography demonstrates total occlusion of the RCA beyond the origin of the PDA. The left ventricular Mccall Lomax is supplied by collaterals from the left atrial recurrent Raley Novicki of the distal circumflex.  75-80% mid to distal circumflex stenosis. High-grade complex stenosis in the second obtuse marginal Roselyne Stalnaker which is small to moderate in size.  Widely patent LAD and a dominant branching ramus intermedius.  Overall normal LV function with EF 55-60%, and inferobasal hypokinesis.   07/2015 PCI  Post Atrio lesion, 100% stenosed.  Ost LAD to Prox LAD lesion, 30% stenosed.  Mid RCA to Dist RCA lesion, 40% stenosed.  Ost 2nd Mrg to 2nd Mrg lesion, 90% stenosed. There is a 0% residual stenosis post intervention.  A drug-eluting stent was placed.  Prox Cx lesion, 75% stenosed. There is a  0% residual stenosis post intervention.  1. Continued occlusion of the right posterior AV groove artery with left to right collaterals. Severe mid left circumflex stenosis into OM 2. 2. Mildly elevated left ventricular end-diastolic pressure. 3. Successful angioplasty and drug-eluting stent placement to the mid left circumflex extending into OM 2. I used one long stent which covered both lesions.  03/2017 nuclear stress  Blood pressure demonstrated a normal response to exercise. Duke treadmill score of 9 consistent with low risk for major cardiac events  There was no ST segment deviation noted during stress.  Findings consistent with prior inferior myocardial infarction with very mild peri-infarct ischemia.  This is a low risk study. Particularly when considering good  functional capacity and low risk Duke treadmill score.  The left ventricular ejection fraction is normal (55-65%).    Assessment and Plan  1. CADwith stable angina - recent admission to [facility] in [facility] with accelerating angina - we are requesting records. From verbal report no signis of MI, cath showed stable disease - we have suspected he has had some intermittent angina related to his RCA CTI with perhaps insufficiency collaterals, prior perfusion imaging has show some mild ischemia in this area - work to [employer] antianginal therapy, increase lopressor to 50mg  bid. Continue imdur 15mg , has had some side effects on this medicine prior but currently tolerating. Titrate/add additional antianginal therapy as tolerated, if refractory would refer to CTO clinic for evaluation.      Virtual visit 6 weeks    , M.D.

## 2020-05-07 DIAGNOSIS — S46211A Strain of muscle, fascia and tendon of other parts of biceps, right arm, initial encounter: Secondary | ICD-10-CM | POA: Diagnosis not present

## 2020-05-09 DIAGNOSIS — M25521 Pain in right elbow: Secondary | ICD-10-CM | POA: Diagnosis not present

## 2020-05-13 ENCOUNTER — Telehealth: Payer: Self-pay | Admitting: *Deleted

## 2020-05-13 NOTE — Telephone Encounter (Signed)
° °  [facility] Pre-operative Risk Assessment    HEARTCARE STAFF: - Please ensure there is not already an duplicate clearance open for this procedure. - Under Visit Info/Reason for Call, type in Other and utilize the format Clearance MM/DD/YY or Clearance TBD. Do not use dashes or single digits. - If request is for dental extraction, please clarify the # of teeth to be extracted.  Request for surgical clearance: URGENT 1. What type of surgery is being performed? RIGHT DISTAL BICEP REPAIR   2. When is this surgery scheduled? 05/15/20    3. What type of clearance is required (medical clearance vs. Pharmacy clearance to hold med vs. Both)? MEDICAL  4. Are there any medications that need to be held prior to surgery and how long? ASA    5. Practice name and name of physician performing surgery? [facility]; DR. Elsie Saas   6. What is the office phone number? [phone]   7.   What is the office fax number? [place].   Anesthesia type (None, local, MAC, general) ? CHOICE   Julaine Hua 05/13/2020, 2:23 PM  _________________________________________________________________   (provider comments below)

## 2020-05-13 NOTE — Telephone Encounter (Signed)
   Primary Cardiologist: Dina Rich, MD  Chart reviewed as part of pre-operative protocol coverage. Patient was contacted 05/13/2020 in reference to pre-operative risk assessment for pending surgery as outlined below.  MADDAX PALINKAS was last seen on 04/08/20 by Dr. Wyline Mood.  Since that day, ZAKARIYYA HELFMAN has continued to have intermittent chest pain with exertion, though overall improved with the increase in metoprolol at his last visit. He reports chest pain with fast walking, heavy lifting, or sexual activity.   He is planned for a biceps tendon repair which is a necessary surgery to restore normal function of his arm. He had a heart catheterization 5/2021which reportedly revealed a CTO to RCA and otherwise non-obstructive disease. Given ongoing symptoms, will route to Dr. Wyline Mood for input, though favor going forward with the procedure without further cardiac work-up as this would only delay care.   Once I hear back from Dr. Wyline Mood, I will route this recommendation to the requesting party via Epic fax function.   Beatriz Stallion, PA-C 05/13/2020, 3:27 PM

## 2020-05-14 ENCOUNTER — Telehealth: Payer: Self-pay | Admitting: Cardiology

## 2020-05-14 NOTE — Telephone Encounter (Signed)
   Primary Cardiologist: Dina Rich, MD  Chart reviewed as part of pre-operative protocol coverage. Per Dr. Wyline Mood, Alex Barnes would be at acceptable risk for the planned procedure without further cardiovascular testing.   I will route this recommendation to the requesting party via Epic fax function and remove from pre-op pool.  Please call with questions.  Beatriz Stallion, PA-C 05/14/2020, 12:38 PM

## 2020-05-14 NOTE — Telephone Encounter (Signed)
Recommend proceeding with surgery. His anatomy is chronic and stable, would consider the inferior RCA CTO relatively low risk anatomy and there would not be any indication for intervention prior to surgery or delaying the surgery   Dominga Ferry MD

## 2020-05-14 NOTE — Telephone Encounter (Signed)
   Preop status addressed in telephone note 05/13/20. Will resubmit fax now and ask callback to confirm requesting office has received.   Beatriz Stallion, PA-C 05/14/20; 3:12 PM

## 2020-05-14 NOTE — Telephone Encounter (Signed)
I will forward back to pre-op pool to ascertain if clearance was sent to Dr. Thurston Hole

## 2020-05-14 NOTE — Telephone Encounter (Signed)
Forwarded to requesting provider via EPIC fax function 

## 2020-05-14 NOTE — Telephone Encounter (Signed)
Called and s/w Sherri she states that she received clearance and will proceed with URGENT RIGHT DISTAL BICEP REPAIR. NOTHING FURTHER NEEDED.

## 2020-05-14 NOTE — Telephone Encounter (Signed)
Dr Thurston Hole requesting a call from Dr.Branch Surgical Clearance rec'd 05-13-20 Surgery planned 05-15-20 (Snap Shot noted waiting for response from Dr.Branch)n    Dr.Wainer [phone]   Thanks renee

## 2020-05-22 DIAGNOSIS — S46211A Strain of muscle, fascia and tendon of other parts of biceps, right arm, initial encounter: Secondary | ICD-10-CM | POA: Diagnosis not present

## 2020-05-22 DIAGNOSIS — M66821 Spontaneous rupture of other tendons, right upper arm: Secondary | ICD-10-CM | POA: Diagnosis not present

## 2020-05-22 DIAGNOSIS — G8918 Other acute postprocedural pain: Secondary | ICD-10-CM | POA: Diagnosis not present

## 2020-05-22 DIAGNOSIS — M7521 Bicipital tendinitis, right shoulder: Secondary | ICD-10-CM | POA: Diagnosis not present

## 2020-05-27 ENCOUNTER — Telehealth (INDEPENDENT_AMBULATORY_CARE_PROVIDER_SITE_OTHER): Payer: BC Managed Care – PPO | Admitting: Cardiology

## 2020-05-27 ENCOUNTER — Encounter: Payer: Self-pay | Admitting: Cardiology

## 2020-05-27 VITALS — BP 127/80 | HR 60 | Ht 70.0 in | Wt 225.0 lb

## 2020-05-27 DIAGNOSIS — I25118 Atherosclerotic heart disease of native coronary artery with other forms of angina pectoris: Secondary | ICD-10-CM

## 2020-05-27 NOTE — Patient Instructions (Addendum)
Medication Instructions:   Your physician recommends that you continue on your current medications as directed. Please refer to the Current Medication list given to you today. *If you need a refill on your cardiac medications before your next appointment, please call your pharmacy*   Lab Work:  NONE If you have labs (blood work) drawn today and your tests are completely normal, you will receive your results only by: . MyChart Message (if you have MyChart) OR . A paper copy in the mail If you have any lab test that is abnormal or we need to change your treatment, we will call you to review the results.   Testing/Procedures:  NONE   Follow-Up: At [facility], you and your health needs are our priority.  As part of our continuing mission to provide you with exceptional heart care, we have created designated Provider Care Teams.  These Care Teams include your primary Cardiologist (physician) and Advanced Practice Providers (APPs -  Physician Assistants and Nurse Practitioners) who all work together to provide you with the care you need, when you need it.  We recommend signing up for the patient portal called "MyChart".  Sign up information is provided on this After Visit Summary.  MyChart is used to connect with patients for Virtual Visits (Telemedicine).  Patients are able to view lab/test results, encounter notes, upcoming appointments, etc.  Non-urgent messages can be sent to your provider as well.   To learn more about what you can do with MyChart, go to [email or web address].    Your next appointment:    6 months  The format for your next appointment:   In Person  Provider:   You may see Branch, Jonathan, MD or the following Advanced Practice Provider on your designated Care Team:    Andy Quinn, NP    Other Instructions  

## 2020-05-27 NOTE — Progress Notes (Signed)
Virtual Visit via Telephone Note   This visit type was conducted due to national recommendations for restrictions regarding the COVID-19 Pandemic (e.g. social distancing) in an effort to limit this patient's exposure and mitigate transmission in our community.  Due to his co-morbid illnesses, this patient is at least at moderate risk for complications without adequate follow up.  This format is felt to be most appropriate for this patient at this time.  The patient did not have access to video technology/had technical difficulties with video requiring transitioning to audio format only (telephone).  All issues noted in this document were discussed and addressed.  No physical exam could be performed with this format.  Please refer to the patient's chart for his  consent to telehealth for [facility].    Date:  05/27/2020   ID:  Alex Barnes, DOB Apr 01, 1971, MRN [id number] The patient was identified using 2 identifiers.  Patient Location: Home Provider Location: Office/Clinic  PCP:  Richardean Chimera, MD  Cardiologist:  Dina Rich, MD  Electrophysiologist:  None   Evaluation Performed:  Follow-Up Visit  Chief Complaint:  Follow up  History of Present Illness:    Alex Barnes is a 49 y.o. male seen today for follow up of the following medical problems. This is a focused visit on history of CAD with chronic stable angina.    1. CAD - admit 07/2015 with NSTEMI. Found to have totally occluded RCA, as well as significant LCX and OM2 disease. S/p DES to LCX and OM2, RCA occlusion managed medically. - 10/2015 echo LVEF 55-60%, no WMAs - treated for post-MI pericarditis with colchicine and ASA/NSAIDs for a period -he continued to have atypical chest pain. Right sided, worst with position, worst with deep breathing. Constant pain lasting days at a time.  -had his gallbladder removed, his chest pain symptoms resolved at that time    03/2017 nuclear stress: duke treadmill score  of 9, inferior scar with very mild peri-infarct ischemia, low risk. Correlates with known RCA CTO.      - prior chest pains with heavy exercise. Higher lopressor dose in January improved it - gradual increase in symptoms over the last few months - while in [facility], progression of symptoms. Went in ER in [place]. Enzymes normal - had heart cath, no new disease per verbal report. Tried to open RCA CTO but unsuccesful   - started on imdur 15mg  daily, tolerating ok. Previously did not tolerate  - last visit reported some chest pain left sided, tightness with some pain with heart beats. 8/10 in severity. Mild dizziness, no there associated symptoms. Pain would last minutes at time. Worst with exertion, and still so. Can be worst with deep breathing. Similar to prior angina. - limiting exertion.  - chest pains are improving since last visit, improved with higher lopressor dosing.      2. Bicep tendon rupture -recent surgery, in cast currently and then will require several months of rehab  Recent COVID diagnosed around Thanksgiving. Reports mild course. Wife and daughter had mild courses as well  The patient does not have symptoms concerning for COVID-19 infection (fever, chills, cough, or new shortness of breath).    Past Medical History:  Diagnosis Date   CAD (coronary artery disease)    cath 9/26 & 07/31/2015 100% total occlusion of post atrio Wyeth Hoffer of RCA, LV supplied by collaterals from the L atrial recurrent Lovella Hardie of distal LCx, 75-80% mid to distal LCx stenosis, high grade complex  stenosis of OM2, patent LAD and RI, EF 55-60%. On 9/8, single DES to midLCx extending into OM2.   Headache    "weekly" (07/29/2015)   Hypercholesteremia    NSTEMI (non-ST elevated myocardial infarction) ([facility]) 07/28/2015   Past Surgical History:  Procedure Laterality Date   CARDIAC CATHETERIZATION  2006; 07/29/2015   CARDIAC CATHETERIZATION N/A 07/29/2015   Procedure: Left  Heart Cath and Coronary Angiography;  Surgeon: Lyn Records, MD;  Location: [facility] INVASIVE CV LAB;  Service: Cardiovascular;  Laterality: N/A;   CARDIAC CATHETERIZATION N/A 07/31/2015   Procedure: Coronary Stent Intervention;  Surgeon: Iran Ouch, MD;  Location: [facility] INVASIVE CV LAB;  Service: Cardiovascular;  Laterality: N/A;   CARDIAC CATHETERIZATION N/A 07/31/2015   Procedure: Left Heart Cath and Coronary Angiography;  Surgeon: Iran Ouch, MD;  Location: [facility] INVASIVE CV LAB;  Service: Cardiovascular;  Laterality: N/A;   CHOLECYSTECTOMY     TONSILLECTOMY  ~ 1989   WISDOM TOOTH EXTRACTION  1993     Current Meds  Medication Sig   aspirin 81 MG chewable tablet Chew 1 tablet (81 mg total) by mouth daily.   ibuprofen (ADVIL) 800 MG tablet Take 800 mg by mouth 3 (three) times daily as needed.   isosorbide mononitrate (IMDUR) 30 MG 24 hr tablet Take 15 mg by mouth daily.   lisinopril (ZESTRIL) 2.5 MG tablet TAKE 2 TABLETS BY MOUTH EVERY DAY (Patient taking differently: Take 5 mg by mouth every morning. )   metoprolol tartrate (LOPRESSOR) 50 MG tablet Take 1 tablet (50 mg total) by mouth 2 (two) times daily.   nitroGLYCERIN (NITROSTAT) 0.4 MG SL tablet Place 1 tablet (0.4 mg total) under the tongue every 5 (five) minutes as needed for chest pain.   pantoprazole (PROTONIX) 40 MG tablet Take 40 mg by mouth daily.   rosuvastatin (CRESTOR) 40 MG tablet TAKE 1 TABLET BY MOUTH EVERY DAY. NEED APPT FOR FURTHER REFILLS   testosterone cypionate (DEPOTESTOSTERONE CYPIONATE) 200 MG/ML injection Inject 1 mL into the muscle every 14 (fourteen) days.     Allergies:   Patient has no known allergies.   Social History   Tobacco Use   Smoking status: Current Some Day Smoker    Packs/day: 0.50    Years: 10.00    Pack years: 5.00    Types: Cigarettes, Cigars    Last attempt to quit: 03/08/2020    Years since quitting: 0.2   Smokeless tobacco: Former Neurosurgeon   Tobacco comment: "quit smoking  cigarettes in 1998"  Vaping Use   Vaping Use: Never used  Substance Use Topics   Alcohol use: Yes    Alcohol/week: 0.0 standard drinks    Comment: 6 drinks weekly    Drug use: No     Family Hx: The patient's family history includes Cancer in his father.  ROS:   Please see the history of present illness.    All other systems reviewed and are negative.   Prior CV studies:   The following studies were reviewed today:   Labs/Other Tests and Data Reviewed:    EKG:  n/a  Recent Labs: No results found for requested labs within last 8760 hours.   Recent Lipid Panel No results found for: CHOL, TRIG, HDL, CHOLHDL, LDLCALC, LDLDIRECT  Wt Readings from Last 3 Encounters:  05/27/20 (!) 225 lb (102.1 kg)  04/08/20 233 lb 6.4 oz (105.9 kg)  11/20/19 212 lb (96.2 kg)     Objective:    Vital Signs:  BP  127/80    Pulse 60    Ht 5\' 10"  (1.778 m)    Wt (!) 225 lb (102.1 kg)    BMI 32.28 kg/m    Normal affect. Normal speech pattern and tone. Comfortable, no apparent distress. No audible signs of sob or wheezing.   ASSESSMENT & PLAN:    1. CAD with chronic stable angina - we have suspected he has had some intermittent angina related to his RCA CTI with perhaps insufficiency collaterals, prior perfusion imaging has show some mild ischemia in this area. Recent cath in [facility] with RCA CTO and no new significant disease - significant improvement in symptoms with increase in lopressor, continue antianginal therapy, room to titrate further if needed.  - Continue imdur 15mg , has had some side effects on this medicine prior but currently tolerating - if ever refractory symptoms would refer to CTO clinic   COVID-19 Education: The signs and symptoms of COVID-19 were discussed with the patient and how to seek care for testing (follow up with PCP or arrange E-visit).  The importance of social distancing was discussed today.  Time:   Today, I have spent 12 minutes with the patient with  telehealth technology discussing the above problems.     Medication Adjustments/Labs and Tests Ordered: Current medicines are reviewed at length with the patient today.  Concerns regarding medicines are outlined above.   Tests Ordered: No orders of the defined types were placed in this encounter.   Medication Changes: No orders of the defined types were placed in this encounter.   Follow Up:  In Person in 6 month(s)  Signed, [facility], MD  05/27/2020 9:57 AM

## 2020-05-30 DIAGNOSIS — M66821 Spontaneous rupture of other tendons, right upper arm: Secondary | ICD-10-CM | POA: Diagnosis not present

## 2020-06-06 DIAGNOSIS — M66821 Spontaneous rupture of other tendons, right upper arm: Secondary | ICD-10-CM | POA: Diagnosis not present

## 2020-06-20 DIAGNOSIS — M66821 Spontaneous rupture of other tendons, right upper arm: Secondary | ICD-10-CM | POA: Diagnosis not present

## 2020-07-03 DIAGNOSIS — M66821 Spontaneous rupture of other tendons, right upper arm: Secondary | ICD-10-CM | POA: Diagnosis not present

## 2020-07-09 DIAGNOSIS — E291 Testicular hypofunction: Secondary | ICD-10-CM | POA: Diagnosis not present

## 2020-07-11 DIAGNOSIS — S46111A Strain of muscle, fascia and tendon of long head of biceps, right arm, initial encounter: Secondary | ICD-10-CM | POA: Diagnosis not present

## 2020-07-15 DIAGNOSIS — S46111A Strain of muscle, fascia and tendon of long head of biceps, right arm, initial encounter: Secondary | ICD-10-CM | POA: Diagnosis not present

## 2020-07-17 DIAGNOSIS — S46111A Strain of muscle, fascia and tendon of long head of biceps, right arm, initial encounter: Secondary | ICD-10-CM | POA: Diagnosis not present

## 2020-07-22 DIAGNOSIS — S46111A Strain of muscle, fascia and tendon of long head of biceps, right arm, initial encounter: Secondary | ICD-10-CM | POA: Diagnosis not present

## 2020-07-24 DIAGNOSIS — S46111A Strain of muscle, fascia and tendon of long head of biceps, right arm, initial encounter: Secondary | ICD-10-CM | POA: Diagnosis not present

## 2020-07-30 DIAGNOSIS — M66821 Spontaneous rupture of other tendons, right upper arm: Secondary | ICD-10-CM | POA: Diagnosis not present

## 2020-07-31 DIAGNOSIS — S46111A Strain of muscle, fascia and tendon of long head of biceps, right arm, initial encounter: Secondary | ICD-10-CM | POA: Diagnosis not present

## 2020-08-05 DIAGNOSIS — S46111A Strain of muscle, fascia and tendon of long head of biceps, right arm, initial encounter: Secondary | ICD-10-CM | POA: Diagnosis not present

## 2020-08-06 DIAGNOSIS — E291 Testicular hypofunction: Secondary | ICD-10-CM | POA: Diagnosis not present

## 2020-08-07 DIAGNOSIS — S46111A Strain of muscle, fascia and tendon of long head of biceps, right arm, initial encounter: Secondary | ICD-10-CM | POA: Diagnosis not present

## 2020-08-12 DIAGNOSIS — S46111A Strain of muscle, fascia and tendon of long head of biceps, right arm, initial encounter: Secondary | ICD-10-CM | POA: Diagnosis not present

## 2020-08-19 DIAGNOSIS — S46111A Strain of muscle, fascia and tendon of long head of biceps, right arm, initial encounter: Secondary | ICD-10-CM | POA: Diagnosis not present

## 2020-08-20 DIAGNOSIS — M66821 Spontaneous rupture of other tendons, right upper arm: Secondary | ICD-10-CM | POA: Diagnosis not present

## 2020-08-29 DIAGNOSIS — E782 Mixed hyperlipidemia: Secondary | ICD-10-CM | POA: Diagnosis not present

## 2020-08-29 DIAGNOSIS — K219 Gastro-esophageal reflux disease without esophagitis: Secondary | ICD-10-CM | POA: Diagnosis not present

## 2020-08-29 DIAGNOSIS — Z720 Tobacco use: Secondary | ICD-10-CM | POA: Diagnosis not present

## 2020-08-29 DIAGNOSIS — I10 Essential (primary) hypertension: Secondary | ICD-10-CM | POA: Diagnosis not present

## 2020-09-18 DIAGNOSIS — E291 Testicular hypofunction: Secondary | ICD-10-CM | POA: Diagnosis not present

## 2020-10-25 ENCOUNTER — Other Ambulatory Visit: Payer: Self-pay | Admitting: Cardiology

## 2020-12-02 ENCOUNTER — Ambulatory Visit: Payer: BC Managed Care – PPO | Admitting: Cardiology

## 2020-12-13 ENCOUNTER — Ambulatory Visit: Payer: BC Managed Care – PPO | Admitting: Cardiology

## 2020-12-13 NOTE — Progress Notes (Deleted)
Clinical Summary Alex Barnes is a 50 y.o.male  1. CAD - admit 07/2015 with NSTEMI. Found to have totally occluded RCA, as well as significant LCX and OM2 disease. S/p DES to LCX and OM2, RCA occlusion managed medically. - 10/2015 echo LVEF 55-60%, no WMAs - treated for post-MI pericarditis with colchicine and ASA/NSAIDs for a period -he continued to have atypical chest pain. Right sided, worst with position, worst with deep breathing. Constant pain lasting days at a time.  -had his gallbladder removed, his chest pain symptoms resolved at that time    03/2017 nuclear stress: duke treadmill score of 9, inferior scar with very mild peri-infarct ischemia, low risk. Correlates with known RCA CTO.      - prior chest pains with heavy exercise. Higher lopressor dose in January improved it - gradual increase in symptoms over the last few months - while in [facility], progression of symptoms. Went in ER in [place]. Enzymes normal - had heart cath, no new diseaseper verbal report. Tried to open RCA CTObut unsuccesful  - started on imdur 15mg  daily, tolerating ok. Previously did not tolerate  - last visit reported some chest pain left sided, tightness with some pain with heart beats. 8/10 in severity. Mild dizziness, no there associated symptoms. Pain would last minutes at time. Worst with exertion, and still so. Can be worst with deep breathing. Similar to prior angina. - limiting exertion.  - chest pains are improving since last visit, improved with higher lopressor dosing.      2. Bicep tendon rupture -recent surgery, in cast currently and then will require several months of rehab  2. Hyperlipidemia -recent labs with pcp - compliant with statin  3. HTN -compliant with meds        Recent COVID diagnosed around Thanksgiving. Reports mild course. Wife and daughter had mild courses as well Past Medical History:  Diagnosis Date  . CAD (coronary  artery disease)    cath 9/26 & 07/31/2015 100% total occlusion of post atrio Kelyse Pask of RCA, LV supplied by collaterals from the L atrial recurrent Eileene Kisling of distal LCx, 75-80% mid to distal LCx stenosis, high grade complex stenosis of OM2, patent LAD and RI, EF 55-60%. On 9/8, single DES to midLCx extending into OM2.  . Headache    "weekly" (07/29/2015)  . Hypercholesteremia   . NSTEMI (non-ST elevated myocardial infarction) (HCC) 07/28/2015     No Known Allergies   Current Outpatient Medications  Medication Sig Dispense Refill  . aspirin 81 MG chewable tablet Chew 1 tablet (81 mg total) by mouth daily.    07/30/2015 ibuprofen (ADVIL) 800 MG tablet Take 800 mg by mouth 3 (three) times daily as needed.    . isosorbide mononitrate (IMDUR) 30 MG 24 hr tablet Take 15 mg by mouth daily.    Marland Kitchen lisinopril (ZESTRIL) 2.5 MG tablet TAKE 2 TABLETS BY MOUTH EVERY DAY 180 tablet 2  . metoprolol tartrate (LOPRESSOR) 50 MG tablet TAKE 1 TABLET BY MOUTH TWICE A DAY 180 tablet 1  . nitroGLYCERIN (NITROSTAT) 0.4 MG SL tablet Place 1 tablet (0.4 mg total) under the tongue every 5 (five) minutes as needed for chest pain. 25 tablet 3  . pantoprazole (PROTONIX) 40 MG tablet Take 40 mg by mouth daily.    . rosuvastatin (CRESTOR) 40 MG tablet TAKE 1 TABLET BY MOUTH EVERY DAY. NEED APPT FOR FURTHER REFILLS 90 tablet 2  . testosterone cypionate (DEPOTESTOSTERONE CYPIONATE) 200 MG/ML injection Inject 1 mL  into the muscle every 14 (fourteen) days.     No current facility-administered medications for this visit.     Past Surgical History:  Procedure Laterality Date  . CARDIAC CATHETERIZATION  2006; 07/29/2015  . CARDIAC CATHETERIZATION N/A 07/29/2015   Procedure: Left Heart Cath and Coronary Angiography;  Surgeon: Lyn Records, MD;  Location: [facility] INVASIVE CV LAB;  Service: Cardiovascular;  Laterality: N/A;  . CARDIAC CATHETERIZATION N/A 07/31/2015   Procedure: Coronary Stent Intervention;  Surgeon: Iran Ouch, MD;   Location: [facility] INVASIVE CV LAB;  Service: Cardiovascular;  Laterality: N/A;  . CARDIAC CATHETERIZATION N/A 07/31/2015   Procedure: Left Heart Cath and Coronary Angiography;  Surgeon: Iran Ouch, MD;  Location: [facility] INVASIVE CV LAB;  Service: Cardiovascular;  Laterality: N/A;  . CHOLECYSTECTOMY    . TONSILLECTOMY  ~ 1989  . WISDOM TOOTH EXTRACTION  1993     No Known Allergies    Family History  Problem Relation Age of Onset  . Cancer Father      Social History Mr. Shughart reports that he has been smoking cigarettes and cigars. He has a 5.00 pack-year smoking history. He has quit using smokeless tobacco. Mr. Brickell reports current alcohol use.   Review of Systems CONSTITUTIONAL: No weight loss, fever, chills, weakness or fatigue.  HEENT: Eyes: No visual loss, blurred vision, double vision or yellow sclerae.No hearing loss, sneezing, congestion, runny nose or sore throat.  SKIN: No rash or itching.  CARDIOVASCULAR:  RESPIRATORY: No shortness of breath, cough or sputum.  GASTROINTESTINAL: No anorexia, nausea, vomiting or diarrhea. No abdominal pain or blood.  GENITOURINARY: No burning on urination, no polyuria NEUROLOGICAL: No headache, dizziness, syncope, paralysis, ataxia, numbness or tingling in the extremities. No change in bowel or bladder control.  MUSCULOSKELETAL: No muscle, back pain, joint pain or stiffness.  LYMPHATICS: No enlarged nodes. No history of splenectomy.  PSYCHIATRIC: No history of depression or anxiety.  ENDOCRINOLOGIC: No reports of sweating, cold or heat intolerance. No polyuria or polydipsia.  Marland Kitchen   Physical Examination There were no vitals filed for this visit. There were no vitals filed for this visit.  Gen: resting comfortably, no acute distress HEENT: no scleral icterus, pupils equal round and reactive, no palptable cervical adenopathy,  CV Resp: Clear to auscultation bilaterally GI: abdomen is soft, non-tender, non-distended, normal bowel sounds,  no hepatosplenomegaly MSK: extremities are warm, no edema.  Skin: warm, no rash Neuro:  no focal deficits Psych: appropriate affect   Diagnostic Studies     Assessment and Plan   1. CAD with chronic stable angina - we have suspected he has had some intermittent angina related to his RCA CTO with perhaps insufficient collaterals, prior perfusion imaging has show some mild ischemia in this area. Recent cath in [facility] with RCA CTO and no new significant disease - significant improvement in symptoms with increase in lopressor, continue antianginal therapy, room to titrate further if needed.  - Continue imdur 15mg , has had some side effects on this medicine prior but currently tolerating - if ever refractory symptoms would refer to CTO clinic     , M.D., F.A.C.C.

## 2021-01-12 ENCOUNTER — Other Ambulatory Visit: Payer: Self-pay | Admitting: Cardiology

## 2021-02-09 ENCOUNTER — Other Ambulatory Visit: Payer: Self-pay | Admitting: Cardiology

## 2021-02-27 ENCOUNTER — Telehealth: Payer: Self-pay | Admitting: Cardiology

## 2021-02-27 NOTE — Telephone Encounter (Signed)
Pt c/o of Chest Pain: Call came from [place] -wife   1. Are you having CP right now? Patient has  Chronic Angina and Pericarditis. States he knows the difference between heart attack and the above.   2. Are you experiencing any other symptoms (ex. SOB, nausea, vomiting, sweating)? Sweating for several days. Patient has low testosterone also. (sweats with this)  3. How long have you been experiencing CP? Since 02/24/2021  4. Is your CP continuous or coming and going?  Both - patient has actually gone to work now . 5. Have you taken Nitroglycerin?  No   Please call wife [phone]

## 2021-02-27 NOTE — Telephone Encounter (Signed)
Per wife, patient currently at work and says patient reports constant chest pain rated 6/10. Has not used nitroglycerin. Instructed to use prn nitro as directed. Denies sob or dizziness. Advised that he was pass due for office visit. First available given to see NP, Mardelle Matte on 03/11/21 @11 :00 am. Advised if symptoms get worse or having to use a 3rd nitro, to go the ED for an evaluation. Verbalized understanding of plan.

## 2021-02-28 NOTE — Telephone Encounter (Signed)
Agree with your assessment and plan  J Prescott Truex MD 

## 2021-03-01 ENCOUNTER — Other Ambulatory Visit: Payer: Self-pay | Admitting: Cardiology

## 2021-03-03 DIAGNOSIS — I10 Essential (primary) hypertension: Secondary | ICD-10-CM | POA: Diagnosis not present

## 2021-03-03 DIAGNOSIS — E782 Mixed hyperlipidemia: Secondary | ICD-10-CM | POA: Diagnosis not present

## 2021-03-03 DIAGNOSIS — R61 Generalized hyperhidrosis: Secondary | ICD-10-CM | POA: Diagnosis not present

## 2021-03-03 DIAGNOSIS — Z1159 Encounter for screening for other viral diseases: Secondary | ICD-10-CM | POA: Diagnosis not present

## 2021-03-03 DIAGNOSIS — K219 Gastro-esophageal reflux disease without esophagitis: Secondary | ICD-10-CM | POA: Diagnosis not present

## 2021-03-03 DIAGNOSIS — E559 Vitamin D deficiency, unspecified: Secondary | ICD-10-CM | POA: Diagnosis not present

## 2021-03-03 DIAGNOSIS — E291 Testicular hypofunction: Secondary | ICD-10-CM | POA: Diagnosis not present

## 2021-03-10 NOTE — Progress Notes (Signed)
Cardiology Office Note  Date: 03/11/2021   ID: Alex Barnes, DOB May 14, 1971, MRN [id number]  PCP:  Richardean Chimera, MD  Cardiologist:  Dina Rich, MD Electrophysiologist:  None   Chief Complaint: Chest pain  History of Present Illness: Alex Barnes is a 50 y.o. male with a history of CAD/NSTEMI 2016.  Hyperlipidemia, headache.   2016 Cardiac catheterization with totally occluded RCA as well as significant LCx and OM disease.  Status post DES to LCx and OM 2, RCA occlusion managed medically.  Echocardiogram 2016 LVEF 55 to 60%, no WMA's.  Treated for post MI pericarditis with colchicine aspirin and NSAIDs.  He continued to have atypical chest pain right-sided, worse with position changes, worse with deep breathing, constant pain lasting days.  After cholecystectomy and chest pain resolved.  Last seen by Dr Wyline Mood 05/27/2020. Dr Wyline Mood suspected he had intermittent angina related to RCA CTO with possible insufficiency collaterals. Prior perfusions imaging had shown some mild ischemia in that area. He had a recent cardiac catheterization in [facility] with RCA CTO with no new significant disease. Had significant improvement with increased dosage of lopressor. He was continuing antianginal therapy. He mentioned there was room to titrate anginal therapy. To continue Imdur 15 mg. Dr Wyline Mood mentioned if patient ever had refractory symptoms would refer to CTO clinic.   Telephone encounter on 02/27/2021 patient's wife called informing staff patient was complaining of chest pain.  He stated he had been sweating for several days with chest pain since 02/24/2021.  This chest pain was described as continuous and coming and going.  He was actually at work.  He had not taken any nitroglycerin.  He was advised to use nitroglycerin as directed.  He was advised if chest pain worsened or having to use a third nitroglycerin to go to the emergency room for evaluation.  He is here today for 70-month  follow-up and recent complaints of chest pain as per note above.  He states since that date the chest pain has gotten a little better.  Still has some exertional chest pain but usually has to do something fairly strenuous to to bring on the chest pain.  States the chest pain usually last for couple of minutes and then is gone.  He denies any other associated symptoms.  He states he cannot remember whether his Imdur was stopped by our office or his primary care provider.  He states he felt like the Imdur was not being effective.  Blood pressure is slightly elevated today 144 8082.  He states at home his systolic blood pressure is usually in the 120s to 130s.  Currently denies any anginal or exertional symptoms, SOB or DOE.  He states he has gained a fair amount of weight since last visit.  At last visit in June 2021 he weighed 233 pounds.  Today's weight is 281.  He states his nitroglycerin sublingual is likely out of date and he needs a refill.  He also states she has not been going to [employer] as he was at prior visit.  He denies any orthostatic symptoms, CVA or TIA-like symptoms, PND, orthopnea.  Denies any claudication-like symptoms, DVT or PE-like symptoms, or lower extremity edema.  Denies any bleeding issues.  EKG on arrival today shows sinus bradycardia with a rate of 58 bpm.  Nonspecific T wave abnormality.  Discussed the fact that Dr. Wyline Mood had mentioned that if she was having refractory symptoms to antianginal therapy plans were to refer  to CTO clinic.    Past Medical History:  Diagnosis Date  . CAD (coronary artery disease)    cath 9/26 & 07/31/2015 100% total occlusion of post atrio branch of RCA, LV supplied by collaterals from the L atrial recurrent branch of distal LCx, 75-80% mid to distal LCx stenosis, high grade complex stenosis of OM2, patent LAD and RI, EF 55-60%. On 9/8, single DES to midLCx extending into OM2.  . Headache    "weekly" (07/29/2015)  . Hypercholesteremia   . NSTEMI  (non-ST elevated myocardial infarction) (HCC) 07/28/2015    Past Surgical History:  Procedure Laterality Date  . CARDIAC CATHETERIZATION  2006; 07/29/2015  . CARDIAC CATHETERIZATION N/A 07/29/2015   Procedure: Left Heart Cath and Coronary Angiography;  Surgeon: Lyn Records, MD;  Location: [facility] INVASIVE CV LAB;  Service: Cardiovascular;  Laterality: N/A;  . CARDIAC CATHETERIZATION N/A 07/31/2015   Procedure: Coronary Stent Intervention;  Surgeon: Iran Ouch, MD;  Location: [facility] INVASIVE CV LAB;  Service: Cardiovascular;  Laterality: N/A;  . CARDIAC CATHETERIZATION N/A 07/31/2015   Procedure: Left Heart Cath and Coronary Angiography;  Surgeon: Iran Ouch, MD;  Location: [facility] INVASIVE CV LAB;  Service: Cardiovascular;  Laterality: N/A;  . CHOLECYSTECTOMY    . TONSILLECTOMY  ~ 1989  . WISDOM TOOTH EXTRACTION  1993    Current Outpatient Medications  Medication Sig Dispense Refill  . aspirin 81 MG chewable tablet Chew 1 tablet (81 mg total) by mouth daily.    Marland Kitchen lisinopril (ZESTRIL) 2.5 MG tablet TAKE 2 TABLETS BY MOUTH EVERY DAY 180 tablet 2  . metoprolol tartrate (LOPRESSOR) 50 MG tablet TAKE 1 TABLET BY MOUTH TWICE A DAY 180 tablet 1  . pantoprazole (PROTONIX) 40 MG tablet Take 40 mg by mouth daily.    Marland Kitchen testosterone cypionate (DEPOTESTOSTERONE CYPIONATE) 200 MG/ML injection Inject 1 mL into the muscle every 14 (fourteen) days.    . nitroGLYCERIN (NITROSTAT) 0.4 MG SL tablet Place 1 tablet (0.4 mg total) under the tongue every 5 (five) minutes as needed for chest pain. 25 tablet 3  . rosuvastatin (CRESTOR) 40 MG tablet Take 1 tablet (40 mg total) by mouth daily. 90 tablet 3   No current facility-administered medications for this visit.   Allergies:  Patient has no known allergies.   Social History: The patient  reports that he has been smoking cigarettes and cigars. He has a 5.00 pack-year smoking history. He has quit using smokeless tobacco. He reports current alcohol use. He reports  that he does not use drugs.   Family History: The patient's family history includes Cancer in his father.   ROS:  Please see the history of present illness. Otherwise, complete review of systems is positive for none.  All other systems are reviewed and negative.   Physical Exam: VS:  BP (!) 144/82   Pulse (!) 58   Ht 5\' 10"  (1.778 m)   Wt 281 lb 9.6 oz (127.7 kg)   SpO2 97%   BMI 40.41 kg/m , BMI Body mass index is 40.41 kg/m.  Wt Readings from Last 3 Encounters:  03/11/21 281 lb 9.6 oz (127.7 kg)  05/27/20 (!) 225 lb (102.1 kg)  04/08/20 233 lb 6.4 oz (105.9 kg)    General: Morbidly obese patient appears comfortable at rest. Neck: Supple, no elevated JVP or carotid bruits, no thyromegaly. Lungs: Clear to auscultation, nonlabored breathing at rest. Cardiac: Regular rate and rhythm, no S3 or significant systolic murmur, no pericardial rub. Extremities: No  pitting edema, distal pulses 2+. Skin: Warm and dry. Musculoskeletal: No kyphosis. Neuropsychiatric: Alert and oriented x3, affect grossly appropriate.  ECG:  An ECG dated 03/11/2021 was personally reviewed today and demonstrated:  Sinus bradycardia rate of 58, nonspecific T wave abnormality  Recent Labwork: No results found for requested labs within last 8760 hours.  No results found for: CHOL, TRIG, HDL, CHOLHDL, VLDL, LDLCALC, LDLDIRECT  Other Studies Reviewed Today:   03/2017 nuclear stress: duke treadmill score of 9, inferior scar with very mild peri-infarct ischemia, low risk. Correlates with known RCA CTO.   Echocardiogram 10/11/2015  Left ventricle: The cavity size was normal. Systolic function was  normal. The estimated ejection fraction was in the range of 55%  to 60%. Wall motion was normal; there were no regional wall  motion abnormalities. Left ventricular diastolic function  parameters were normal. Very mild concentric left ventricular  hypertrophy.    Echocardiogram 08/24/2015 Study  Conclusions   - Left ventricle: The cavity size was normal. Wall thickness was  normal. Systolic function was normal. The estimated ejection  fraction was in the range of 60% to 65%. Wall motion was normal;  there were no regional wall motion abnormalities.  - Left atrium: The atrium was mildly dilated.  - Right atrium: The atrium was mildly dilated.    07/29/2015 Left Heart Cath and Coronary Angiography    Conclusion  1. Ost 2nd Mrg to 2nd Mrg lesion, 85% stenosed. 2. Prox Cx lesion, 75% stenosed. 3. Post Atrio lesion, 100% stenosed. 4. Ost LAD to Prox LAD lesion, 30% stenosed. 5. Mid RCA to Dist RCA lesion, 40% stenosed.    Recent acute inferolateral infarction now greater than 18 hours old. Angiography demonstrates total occlusion of the RCA beyond the origin of the PDA. The left ventricular branch is supplied by collaterals from the left atrial recurrent branch of the distal circumflex.  75-80% mid to distal circumflex stenosis. High-grade complex stenosis in the second obtuse marginal branch which is small to moderate in size.  Widely patent LAD and a dominant branching ramus intermedius.  Overall normal LV function with EF 55-60%, and inferobasal hypokinesis.   Recommendations:   *Dual antiplatelet therapy.  Will eventually need PCI on the mid circumflex and second obtuse marginal branch. Not undertaken today due to acute infarction in the right coronary territory. PCI on the circumflex can be done during this hospital stay or preferably in 7-10 days as needed elective staged procedure.  High-dose statin therapy  Phase I cardiac rehabilitation in a.m.  IV nitroglycerin for an additional 12 hours  IV hydration for an additional 12 hou  Diagnostic Dominance: Right       07/31/2015 Coronary Stent Intervention  Left Heart Cath and Coronary Angiography    Conclusion   Post Atrio lesion, 100% stenosed.  Ost LAD to Prox LAD lesion, 30%  stenosed.  Mid RCA to Dist RCA lesion, 40% stenosed.  Ost 2nd Mrg to 2nd Mrg lesion, 90% stenosed. There is a 0% residual stenosis post intervention.  A drug-eluting stent was placed.  Prox Cx lesion, 75% stenosed. There is a 0% residual stenosis post intervention.   1. Continued occlusion of the right posterior AV groove artery with left to right collaterals. Severe mid left circumflex stenosis into OM 2. 2. Mildly elevated left ventricular end-diastolic pressure. 3. Successful angioplasty and drug-eluting stent placement to the mid left circumflex extending into OM 2. I used one long stent which covered both lesions.  Recommendations: Dual antiplatelets therapy  for at least one year. Aggressive treatment of risk factors.  Diagnostic Dominance: Right    Intervention         Assessment and Plan:  1. CAD in native artery   2. Chronic stable angina (HCC)    1. CAD in native artery States she only has occasional chest pain when doing something more strenuous than usual which usually resolves rather quickly..  Continue aspirin 81 mg daily, sublingual nitroglycerin as needed.  Continue metoprolol 50 mg p.o. twice daily.  Continue Crestor 40 mg daily.  He states he believes the Imdur was stopped by his primary care provider due to not being effective.  2. Chronic stable angina (HCC) Angina appears quite stable for now.  States it is gotten better since the phone call by his wife on 02/24/2021.  States he has had the recent bottle of sublingual nitroglycerin for over a year and likely needs a refill.  Please refill sublingual nitroglycerin.  Advised patient if his angina becomes worse with radiation to neck, arm, back, jaw.  If severity is worsened or the pain last longer was associated symptoms of nausea, sweating, vomiting in spite of nitroglycerin use we will likely need to schedule follow-up cardiac catheterization.  He verbalizes understanding.  3.  Essential  hypertension Blood pressure elevated today on arrival at 144/82.  Continue lisinopril 2.5 mg daily.  Continue metoprolol 50 mg p.o. twice daily.  Patient states his blood pressures at home are usually in the 120s to 130s systolic.  Medication Adjustments/Labs and Tests Ordered: Current medicines are reviewed at length with the patient today.  Concerns regarding medicines are outlined above.   Disposition: Follow-up with Dr. Wyline Mood or APP 6 months  Signed, Rennis Harding, NP 03/11/2021 11:47 AM    [facility] at [facility] [address] [place], [place], [place] [address] Phone: [phone]; Fax: [phone]

## 2021-03-11 ENCOUNTER — Encounter: Payer: Self-pay | Admitting: Family Medicine

## 2021-03-11 ENCOUNTER — Ambulatory Visit: Payer: BC Managed Care – PPO | Admitting: Family Medicine

## 2021-03-11 VITALS — BP 144/82 | HR 58 | Ht 70.0 in | Wt 281.6 lb

## 2021-03-11 DIAGNOSIS — I2510 Atherosclerotic heart disease of native coronary artery without angina pectoris: Secondary | ICD-10-CM | POA: Diagnosis not present

## 2021-03-11 DIAGNOSIS — I208 Other forms of angina pectoris: Secondary | ICD-10-CM

## 2021-03-11 MED ORDER — ROSUVASTATIN CALCIUM 40 MG PO TABS: 40.0000 mg | ORAL_TABLET | Freq: Every day | ORAL | 3 refills | Status: DC

## 2021-03-11 MED ORDER — NITROGLYCERIN 0.4 MG SL SUBL: 0.4000 mg | SUBLINGUAL_TABLET | SUBLINGUAL | 3 refills | Status: AC | PRN

## 2021-03-11 NOTE — Patient Instructions (Signed)
Medication Instructions:  Continue all current medications.   Labwork: none  Testing/Procedures: none  Follow-Up: 6 months   Any Other Special Instructions Will Be Listed Below (If Applicable).   If you need a refill on your cardiac medications before your next appointment, please call your pharmacy.  

## 2021-04-01 ENCOUNTER — Telehealth: Payer: Self-pay | Admitting: Cardiology

## 2021-04-28 NOTE — Progress Notes (Signed)
Cardiology Office Note  Date: 04/29/2021   ID: Alex Barnes, DOB 1971-06-20, MRN [id number]  PCP:  Richardean Chimera, MD  Cardiologist:  Dina Rich, MD Electrophysiologist:  None   Chief Complaint: Chest pain  History of Present Illness: Alex Barnes is a 50 y.o. male with a history of CAD/NSTEMI 2016.  Hyperlipidemia, headache.   2016 Cardiac catheterization with totally occluded RCA as well as significant LCx and OM disease.  Status post DES to LCx and OM 2, RCA occlusion managed medically.  Echocardiogram 2016 LVEF 55 to 60%, no WMA's.  Treated for post MI pericarditis with colchicine aspirin and NSAIDs.  He continued to have atypical chest pain right-sided, worse with position changes, worse with deep breathing, constant pain lasting days.  After cholecystectomy and chest pain resolved.  Last seen by Dr Wyline Mood 05/27/2020. Dr Wyline Mood suspected he had intermittent angina related to RCA CTO with possible insufficiency collaterals. Prior perfusions imaging had shown some mild ischemia in that area. He had a recent cardiac catheterization in [facility] with RCA CTO with no new significant disease. Had significant improvement with increased dosage of lopressor. He was continuing antianginal therapy. He mentioned there was room to titrate anginal therapy. To continue Imdur 15 mg. Dr Wyline Mood mentioned if patient ever had refractory symptoms would refer to CTO clinic.   Telephone encounter on 02/27/2021 patient's wife called informing staff patient was complaining of chest pain.  He stated he had been sweating for several days with chest pain since 02/24/2021.  This chest pain was described as continuous and coming and going.  He was actually at work.  He had not taken any nitroglycerin.  He was advised to use nitroglycerin as directed.  He was advised if chest pain worsened or having to use a third nitroglycerin to go to the emergency room for evaluation.  He was last here for 46-month  follow-up and recent complaints of chest pain.  He stated since the phone call the chest pain was a little better.  Still had some exertional chest pain but usually had to do something fairly strenuous to to bring on the chest pain.  Stated the chest pain usually last for couple of minutes and then resolved.  He denies any other associated symptoms.  He stated he could not remember whether his Imdur was stopped by our office or his primary care provider.  He stated he felt like the Imdur was not being effective.  Blood pressure was slightly elevated at 144/82.  He stated at home his systolic blood pressure was usually in the 120s to 130s.  Currently denies any anginal or exertional symptoms, SOB or DOE.  He states he has gained a fair amount of weight since last visit.  At last visit in June 2021 he weighed 233 pounds.  Today's weight is 281.  He stated his nitroglycerin sublingual is likely out of date and he needs a refill.  He also stated he had not been going to [employer] as he was at prior visit.  He denied any orthostatic symptoms, CVA or TIA-like symptoms, PND, orthopnea.  Denied any claudication-like symptoms, DVT or PE-like symptoms, or lower extremity edema.  No bleeding issues.  EKG showed sinus bradycardia with a rate of 58 bpm.  Nonspecific T wave abnormality.  Discussed the fact that Dr. Wyline Mood had mentioned that if he was having refractory symptoms to antianginal therapy plans were to refer to CTO clinic.  He is here for follow-up  today and states he is having increasing issues with anginal symptoms which seems to have been progressing over the last month or so.  He states his wife actually scheduled the visit due to her concern over his symptoms.  He states he is having more and frequent episodes of anginal symptoms with chest pain radiating from mid chest over to the left shoulder and arm.  Also having episodes of sudden diaphoresis and associated nausea.  He states a few days ago he was  attempting to fix a patch on the bottom of his pool lining and had to make several attempts to repair it.  States he had some significant issues with chest pain and shortness of breath.  States the symptoms took a while to resolve.  He did not take sublingual nitroglycerin due to previous issues with significant headaches associated with use. He states he and Dr. Wyline Mood had previously discussed possible referral for treatment of chronic CTO of RCA.  He states given the situation he would like to proceed with cardiac catheterization for continued anginal symptoms.  He also is having symptoms of snoring, apnea and hypoapnea, excessive daytime sleepiness.  His wife believes he does have obstructive sleep apnea.  He states he has recently gained a significant amount of weight.   Past Medical History:  Diagnosis Date   CAD (coronary artery disease)    cath 9/26 & 07/31/2015 100% total occlusion of post atrio branch of RCA, LV supplied by collaterals from the L atrial recurrent branch of distal LCx, 75-80% mid to distal LCx stenosis, high grade complex stenosis of OM2, patent LAD and RI, EF 55-60%. On 9/8, single DES to midLCx extending into OM2.   Headache    "weekly" (07/29/2015)   Hypercholesteremia    NSTEMI (non-ST elevated myocardial infarction) (HCC) 07/28/2015    Past Surgical History:  Procedure Laterality Date   CARDIAC CATHETERIZATION  2006; 07/29/2015   CARDIAC CATHETERIZATION N/A 07/29/2015   Procedure: Left Heart Cath and Coronary Angiography;  Surgeon: Lyn Records, MD;  Location[facility] INVASIVE CV LAB;  Service: Cardiovascular;  Laterality: N/A;   CARDIAC CATHETERIZATION N/A 07/31/2015   Procedure: Coronary Stent Intervention;  Surgeon: Iran Ouch, MD;  Location: [facility] INVASIVE CV LAB;  Service: Cardiovascular;  Laterality: N/A;   CARDIAC CATHETERIZATION N/A 07/31/2015   Procedure: Left Heart Cath and Coronary Angiography;  Surgeon: Iran Ouch, MD;  Location: [facility] INVASIVE CV LAB;   Service: Cardiovascular;  Laterality: N/A;   CHOLECYSTECTOMY     TONSILLECTOMY  ~ 1989   WISDOM TOOTH EXTRACTION  1993    Current Outpatient Medications  Medication Sig Dispense Refill   aspirin 81 MG chewable tablet Chew 1 tablet (81 mg total) by mouth daily.     ezetimibe (ZETIA) 10 MG tablet Take 10 mg by mouth daily.     lisinopril (ZESTRIL) 2.5 MG tablet TAKE 2 TABLETS BY MOUTH EVERY DAY 180 tablet 2   metoprolol tartrate (LOPRESSOR) 50 MG tablet TAKE 1 TABLET BY MOUTH TWICE A DAY 180 tablet 1   nitroGLYCERIN (NITROSTAT) 0.4 MG SL tablet Place 1 tablet (0.4 mg total) under the tongue every 5 (five) minutes as needed for chest pain. 25 tablet 3   pantoprazole (PROTONIX) 40 MG tablet Take 40 mg by mouth daily.     rosuvastatin (CRESTOR) 40 MG tablet Take 1 tablet (40 mg total) by mouth daily. 90 tablet 3   testosterone cypionate (DEPOTESTOSTERONE CYPIONATE) 200 MG/ML injection Inject 1 mL into the muscle  every 14 (fourteen) days.     No current facility-administered medications for this visit.   Allergies:  Patient has no known allergies.   Social History: The patient  reports that he has been smoking cigarettes and cigars. He has a 5.00 pack-year smoking history. He has quit using smokeless tobacco. He reports current alcohol use. He reports that he does not use drugs.   Family History: The patient's family history includes Cancer in his father.   ROS:  Please see the history of present illness. Otherwise, complete review of systems is positive for none.  All other systems are reviewed and negative.   Physical Exam: VS:  BP (!) 138/94   Pulse 76   Ht 5\' 10"  (1.778 m)   Wt 274 lb 3.2 oz (124.4 kg)   SpO2 98%   BMI 39.34 kg/m , BMI Body mass index is 39.34 kg/m.  Wt Readings from Last 3 Encounters:  04/29/21 274 lb 3.2 oz (124.4 kg)  03/11/21 281 lb 9.6 oz (127.7 kg)  05/27/20 (!) 225 lb (102.1 kg)    General: Morbidly obese patient appears comfortable at rest. Neck:  Supple, no elevated JVP or carotid bruits, no thyromegaly. Lungs: Clear to auscultation, nonlabored breathing at rest. Cardiac: Regular rate and rhythm, no S3 or significant systolic murmur, no pericardial rub. Extremities: No pitting edema, distal pulses 2+. Skin: Warm and dry. Musculoskeletal: No kyphosis. Neuropsychiatric: Alert and oriented x3, affect grossly appropriate.  ECG: 04/29/2021: Normal sinus rhythm rate of 68, moderate voltage criteria for LVH, may be normal variant, inferior infarct, age undetermined  Recent Labwork: No results found for requested labs within last 8760 hours.  No results found for: CHOL, TRIG, HDL, CHOLHDL, VLDL, LDLCALC, LDLDIRECT  Other Studies Reviewed Today:   03/2017 nuclear stress: duke treadmill score of 9, inferior scar with very mild peri-infarct ischemia, low risk. Correlates with known RCA CTO.     Echocardiogram 10/11/2015  Left ventricle: The cavity size was normal. Systolic function was    normal. The estimated ejection fraction was in the range of 55%    to 60%. Wall motion was normal; there were no regional wall    motion abnormalities. Left ventricular diastolic function    parameters were normal. Very mild concentric left ventricular    hypertrophy.    Echocardiogram 08/24/2015 Study Conclusions   - Left ventricle: The cavity size was normal. Wall thickness was    normal. Systolic function was normal. The estimated ejection    fraction was in the range of 60% to 65%. Wall motion was normal;    there were no regional wall motion abnormalities.  - Left atrium: The atrium was mildly dilated.  - Right atrium: The atrium was mildly dilated.    07/29/2015 Left Heart Cath and Coronary Angiography    Conclusion  Ost 2nd Mrg to 2nd Mrg lesion, 85% stenosed. Prox Cx lesion, 75% stenosed. Post Atrio lesion, 100% stenosed. Ost LAD to Prox LAD lesion, 30% stenosed. Mid RCA to Dist RCA lesion, 40% stenosed.   Recent acute  inferolateral infarction now greater than 18 hours old. Angiography demonstrates total occlusion of the RCA beyond the origin of the PDA. The left ventricular branch is supplied by collaterals from the left atrial recurrent branch of the distal circumflex. 75-80% mid to distal circumflex stenosis. High-grade complex stenosis in the second obtuse marginal branch which is small to moderate in size. Widely patent LAD and a dominant branching ramus intermedius. Overall normal LV function with EF  55-60%, and inferobasal hypokinesis.   Recommendations:   *Dual antiplatelet therapy. Will eventually need PCI on the mid circumflex and second obtuse marginal branch. Not undertaken today due to acute infarction in the right coronary territory. PCI on the circumflex can be done during this hospital stay or preferably in 7-10 days as needed elective staged procedure. High-dose statin therapy Phase I cardiac rehabilitation in a.m. IV nitroglycerin for an additional 12 hours IV hydration for an additional 12 hou  Diagnostic Dominance: Right       07/31/2015 Coronary Stent Intervention  Left Heart Cath and Coronary Angiography    Conclusion  Post Atrio lesion, 100% stenosed. Ost LAD to Prox LAD lesion, 30% stenosed. Mid RCA to Dist RCA lesion, 40% stenosed. Ost 2nd Mrg to 2nd Mrg lesion, 90% stenosed. There is a 0% residual stenosis post intervention. A drug-eluting stent was placed. Prox Cx lesion, 75% stenosed. There is a 0% residual stenosis post intervention.   1. Continued occlusion of the right posterior AV groove artery with left to right collaterals. Severe mid left circumflex stenosis into OM 2. 2. Mildly elevated left ventricular end-diastolic pressure. 3. Successful angioplasty and drug-eluting stent placement to the mid left circumflex extending into OM 2. I used one long stent which covered both lesions.   Recommendations: Dual antiplatelets therapy for at least one year.  Aggressive treatment of risk factors.   Diagnostic Dominance: Right    Intervention         Assessment and Plan:  1. CAD in native artery   2. Chronic stable angina (HCC)   3. Essential hypertension   4. Suspected sleep apnea   5. Pre-operative cardiovascular examination   6. Chest pain, unspecified type   7. Shortness of breath     1. CAD in native artery/chest pain He is here today with increasing complaints of episodes of anginal symptoms which are becoming more frequent and lasting longer.  He describes angina as substernal mid chest radiating over to the left arm.  He states he feels the medical therapy is ceasing to be as effective as previous.  He would like to proceed with cardiac catheterization as mentioned previously by Dr. Wyline Mood on prior visits.  We discussed the risk and benefits of cardiac catheterization.  He he is willing to proceed.  Continue aspirin 81 mg daily, sublingual nitroglycerin as needed.  Continue metoprolol 50 mg p.o. twice daily.  Continue Crestor 40 mg daily.  He states he believes the Imdur was stopped by his primary care provider due to not being effective.  Spoke to Dr. Wyline Mood regarding patient's symptoms.  Plans are to refer to Dr. Swazil[place] at CTO clinic for evaluation and to start Norvasc 5 mg p.o. daily.  2. Chronic stable angina (HCC) We had previously advised patient if his angina became worse with radiation to neck, arm, back, jaw.  If severity is worsened or the pain last longer with associated symptoms of nausea, sweating, vomiting in spite of nitroglycerin use we will likely need to schedule follow-up cardiac catheterization.  He verbalized understanding last visit.  Today he presents with his wife stating he is having more frequent episodes of anginal-like symptoms which are lasting longer and associated with shortness of breath.  Also states having episodes of sudden diaphoresis with some associated nausea.  As noted above patient wants to  proceed with cardiac catheterization due to increasing episodes of angina and associated symptoms.  3.  Essential hypertension Blood pressure elevated today on arrival at 138/94.  He states he has not taken his a.m. antihypertensive medication today.  Continue lisinopril 2.5 mg daily.  Continue metoprolol 50 mg p.o. twice daily.    4.  Suspected sleep apnea Wife states he has significant issues with snoring and hypoapnea/apneic periods along with excessive daytime sleepiness.  Please refer to pulmonary Dr. Vassie Loll or Dr. Sherene Sires for OSA evaluation.  5.  Shortness of breath. Patient states he is having increasing issues with shortness of breath.  Please get a follow-up echocardiogram to assess LV function, diastolic function, valvular function.   Medication Adjustments/Labs and Tests Ordered: Current medicines are reviewed at length with the patient today.  Concerns regarding medicines are outlined above.   Disposition: Follow-up with Dr. Wyline Mood or APP 3 months  Signed, Rennis Harding, NP 04/29/2021 9:24 AM    [facility] at [facility] [address] [place], [place], [place] [address] Phone: [phone]; Fax: [phone]

## 2021-04-29 ENCOUNTER — Ambulatory Visit: Payer: BC Managed Care – PPO | Admitting: Family Medicine

## 2021-04-29 ENCOUNTER — Encounter: Payer: Self-pay | Admitting: Family Medicine

## 2021-04-29 VITALS — BP 138/94 | HR 76 | Ht 70.0 in | Wt 274.2 lb

## 2021-04-29 DIAGNOSIS — I2510 Atherosclerotic heart disease of native coronary artery without angina pectoris: Secondary | ICD-10-CM | POA: Diagnosis not present

## 2021-04-29 DIAGNOSIS — Z01810 Encounter for preprocedural cardiovascular examination: Secondary | ICD-10-CM | POA: Diagnosis not present

## 2021-04-29 DIAGNOSIS — I208 Other forms of angina pectoris: Secondary | ICD-10-CM

## 2021-04-29 DIAGNOSIS — R0602 Shortness of breath: Secondary | ICD-10-CM

## 2021-04-29 DIAGNOSIS — R29818 Other symptoms and signs involving the nervous system: Secondary | ICD-10-CM

## 2021-04-29 DIAGNOSIS — I10 Essential (primary) hypertension: Secondary | ICD-10-CM | POA: Diagnosis not present

## 2021-04-29 DIAGNOSIS — R079 Chest pain, unspecified: Secondary | ICD-10-CM

## 2021-04-29 DIAGNOSIS — Z01818 Encounter for other preprocedural examination: Secondary | ICD-10-CM

## 2021-04-29 MED ORDER — AMLODIPINE BESYLATE 5 MG PO TABS: 5.0000 mg | ORAL_TABLET | Freq: Every day | ORAL | 1 refills | Status: DC

## 2021-04-29 NOTE — Patient Instructions (Addendum)
Medication Instructions:  Your physician has recommended you make the following change in your medication:  Start amlodipine 5 mg by mouth daily Continue other medications the same  Labwork: none  Testing/Procedures: Your physician has requested that you have an echocardiogram. Echocardiography is a painless test that uses sound waves to create images of your heart. It provides your doctor with information about the size and shape of your heart and how well your heart's chambers and valves are working. This procedure takes approximately one hour. There are no restrictions for this procedure.  Follow-Up: Your physician recommends that you schedule a follow-up appointment in: 3 months You have been referred to Dr. Sw[place]iland  You have been referred to [facility] Pulmonology  Any Other Special Instructions Will Be Listed Below (If Applicable).  If you need a refill on your cardiac medications before your next appointment, please call your pharmacy.

## 2021-05-07 NOTE — Progress Notes (Deleted)
Cardiology Office Note   Date:  05/07/2021   ID:  Alex Barnes, DOB Jan 28, 1971, MRN [id number]  PCP:  Wayne Both, MD  Cardiologist:   Briselda Naval [place], MD   No chief complaint on file.     History of Present Illness: Alex Barnes is a 50 y.o. male who is seen at the request of Rennis Harding NP for consideration of CTO PCI. He has a history of HLD. He had prior NSTEMI in 2016. Cardiac cath at that time showed occlusion of the PL branch of the RCA. There was severe disease in the LCx and OM2 that was successfully stented with a 2.25 x 30 mm Integrity stent post dilated to 2.75 mm. The PL occlusion was treated medically. Follow up stress Myoview in 2018 showed good exercise tolerance to 9 minutes on the Bruce protocol without chest pain or ST changes. EF was normal. There was a fixed inferior wall defect c/w scar with very mild peri-infarct ischemia. He more recently had a cardiac cath in [facility] Date which showed no new disease and he was managed medically with increased metoprolol and Imdur. More recently seen on 6/28 and reported increased symptoms of chest pain and dyspnea on exertion.     Past Medical History:  Diagnosis Date   CAD (coronary artery disease)    cath 9/26 & 07/31/2015 100% total occlusion of post atrio branch of RCA, LV supplied by collaterals from the L atrial recurrent branch of distal LCx, 75-80% mid to distal LCx stenosis, high grade complex stenosis of OM2, patent LAD and RI, EF 55-60%. On 9/8, single DES to midLCx extending into OM2.   Headache    "weekly" (07/29/2015)   Hypercholesteremia    NSTEMI (non-ST elevated myocardial infarction) (HCC) 07/28/2015    Past Surgical History:  Procedure Laterality Date   CARDIAC CATHETERIZATION  2006; 07/29/2015   CARDIAC CATHETERIZATION N/A 07/29/2015   Procedure: Left Heart Cath and Coronary Angiography;  Surgeon: Lyn Records, MD;  Location: [facility] INVASIVE CV LAB;  Service: Cardiovascular;  Laterality: N/A;    CARDIAC CATHETERIZATION N/A 07/31/2015   Procedure: Coronary Stent Intervention;  Surgeon: Iran Ouch, MD;  Location: [facility] INVASIVE CV LAB;  Service: Cardiovascular;  Laterality: N/A;   CARDIAC CATHETERIZATION N/A 07/31/2015   Procedure: Left Heart Cath and Coronary Angiography;  Surgeon: Iran Ouch, MD;  Location: [facility] INVASIVE CV LAB;  Service: Cardiovascular;  Laterality: N/A;   CHOLECYSTECTOMY     TONSILLECTOMY  ~ 1989   WISDOM TOOTH EXTRACTION  1993     Current Outpatient Medications  Medication Sig Dispense Refill   amLODipine (NORVASC) 5 MG tablet Take 1 tablet (5 mg total) by mouth daily. 90 tablet 1   aspirin 81 MG chewable tablet Chew 1 tablet (81 mg total) by mouth daily.     ezetimibe (ZETIA) 10 MG tablet Take 10 mg by mouth daily.     lisinopril (ZESTRIL) 2.5 MG tablet TAKE 2 TABLETS BY MOUTH EVERY DAY 180 tablet 2   metoprolol tartrate (LOPRESSOR) 50 MG tablet TAKE 1 TABLET BY MOUTH TWICE A DAY 180 tablet 1   nitroGLYCERIN (NITROSTAT) 0.4 MG SL tablet Place 1 tablet (0.4 mg total) under the tongue every 5 (five) minutes as needed for chest pain. 25 tablet 3   pantoprazole (PROTONIX) 40 MG tablet Take 40 mg by mouth daily.     rosuvastatin (CRESTOR) 40 MG tablet Take 1 tablet (40 mg total) by mouth daily. 90  tablet 3   testosterone cypionate (DEPOTESTOSTERONE CYPIONATE) 200 MG/ML injection Inject 1 mL into the muscle every 14 (fourteen) days.     No current facility-administered medications for this visit.    Allergies:   Patient has no known allergies.    Social History:  The patient  reports that he has been smoking cigarettes and cigars. He has a 5.00 pack-year smoking history. He has quit using smokeless tobacco. He reports current alcohol use. He reports that he does not use drugs.   Family History:  The patient's ***family history includes Cancer in his father.    ROS:  Please see the history of present illness.   Otherwise, review of systems are positive for  {NONE DEFAULTED:18576}.   All other systems are reviewed and negative.    PHYSICAL EXAM: VS:  There were no vitals taken for this visit. , BMI There is no height or weight on file to calculate BMI. GEN: Well nourished, well developed, in no acute distress HEENT: normal Neck: no JVD, carotid bruits, or masses Cardiac: ***RRR; no murmurs, rubs, or gallops,no edema  Respiratory:  clear to auscultation bilaterally, normal work of breathing GI: soft, nontender, nondistended, + BS MS: no deformity or atrophy Skin: warm and dry, no rash Neuro:  Strength and sensation are intact Psych: euthymic mood, full affect   EKG:  EKG {ACTION; IS/IS WUJ:[id number]} ordered today. The ekg ordered today demonstrates ***   Recent Labs: No results found for requested labs within last 8760 hours.    Lipid Panel No results found for: CHOL, TRIG, HDL, CHOLHDL, VLDL, LDLCALC, LDLDIRECT    Wt Readings from Last 3 Encounters:  04/29/21 274 lb 3.2 oz (124.4 kg)  03/11/21 281 lb 9.6 oz (127.7 kg)  05/27/20 (!) 225 lb (102.1 kg)      Other studies Reviewed: Additional studies/ records that were reviewed today include:   Cardiac cath 07/29/15:  Left Heart Cath and Coronary Angiography    Conclusion  Ost 2nd Mrg to 2nd Mrg lesion, 85% stenosed. Prox Cx lesion, 75% stenosed. Post Atrio lesion, 100% stenosed. Ost LAD to Prox LAD lesion, 30% stenosed. Mid RCA to Dist RCA lesion, 40% stenosed.   Recent acute inferolateral infarction now greater than 18 hours old. Angiography demonstrates total occlusion of the RCA beyond the origin of the PDA. The left ventricular branch is supplied by collaterals from the left atrial recurrent branch of the distal circumflex. 75-80% mid to distal circumflex stenosis. High-grade complex stenosis in the second obtuse marginal branch which is small to moderate in size. Widely patent LAD and a dominant branching ramus intermedius. Overall normal LV function with EF 55-60%,  and inferobasal hypokinesis.     Recommendations:   *Dual antiplatelet therapy. Will eventually need PCI on the mid circumflex and second obtuse marginal branch. Not undertaken today due to acute infarction in the right coronary territory. PCI on the circumflex can be done during this hospital stay or preferably in 7-10 days as needed elective staged procedure. High-dose statin therapy Phase I cardiac rehabilitation in a.m. IV nitroglycerin for an additional 12 hours IV hydration for an additional 12 hours   PCI 07/31/15:  Coronary Stent Intervention  Left Heart Cath and Coronary Angiography    Conclusion  Post Atrio lesion, 100% stenosed. Ost LAD to Prox LAD lesion, 30% stenosed. Mid RCA to Dist RCA lesion, 40% stenosed. Ost 2nd Mrg to 2nd Mrg lesion, 90% stenosed. There is a 0% residual stenosis post intervention. A drug-eluting stent was  placed. Prox Cx lesion, 75% stenosed. There is a 0% residual stenosis post intervention.   1. Continued occlusion of the right posterior AV groove artery with left to right collaterals. Severe mid left circumflex stenosis into OM 2. 2. Mildly elevated left ventricular end-diastolic pressure. 3. Successful angioplasty and drug-eluting stent placement to the mid left circumflex extending into OM 2. I used one long stent which covered both lesions.   Recommendations: Dual antiplatelets therapy for at least one year. Aggressive treatment of risk factors.     Myoview 04/01/17: Study Result  Narrative & Impression  Blood pressure demonstrated a normal response to exercise. Duke treadmill score of 9 consistent with low risk for major cardiac events There was no ST segment deviation noted during stress. Findings consistent with prior inferior myocardial infarction with very mild peri-infarct ischemia. This is a low risk study. Particularly when considering good functional capacity and low risk Duke treadmill score. The left ventricular ejection  fraction is normal (55-65%).    Echo 10/11/15: Study Conclusions   - Left ventricle: The cavity size was normal. Systolic function was    normal. The estimated ejection fraction was in the range of 55%    to 60%. Wall motion was normal; there were no regional wall    motion abnormalities. Left ventricular diastolic function    parameters were normal. Very mild concentric left ventricular    hypertrophy.   ASSESSMENT AND PLAN:  1.  ***   Current medicines are reviewed at length with the patient today.  The patient {ACTIONS; HAS/DOES NOT HAVE:19233} concerns regarding medicines.  The following changes have been made:  {PLAN; NO CHANGE:13088:s}  Labs/ tests ordered today include: *** No orders of the defined types were placed in this encounter.    Disposition:   FU with *** in {gen number 6-33:[id number]} {Days to years:10300}  Signed, Yaquelin Langelier [place], MD  05/07/2021 1:13 PM    [facility] [address] [place], [place], [address] Phone [phone], Fax [phone]

## 2021-05-09 ENCOUNTER — Ambulatory Visit: Payer: BC Managed Care – PPO | Admitting: Cardiology

## 2021-05-11 ENCOUNTER — Other Ambulatory Visit: Payer: Self-pay | Admitting: Cardiology

## 2021-05-22 ENCOUNTER — Ambulatory Visit (INDEPENDENT_AMBULATORY_CARE_PROVIDER_SITE_OTHER): Payer: BC Managed Care – PPO

## 2021-05-22 DIAGNOSIS — R0602 Shortness of breath: Secondary | ICD-10-CM

## 2021-05-24 LAB — ECHOCARDIOGRAM COMPLETE
AV Vena cont: 0.84 cm
Area-P 1/2: 3.83 cm2
P 1/2 time: 473 msec
S' Lateral: 3.01 cm
Single Plane A4C EF: 61.2 %

## 2021-05-28 ENCOUNTER — Telehealth: Payer: Self-pay | Admitting: *Deleted

## 2021-05-28 NOTE — Telephone Encounter (Signed)
-----   Message from Eustace Moore, RN sent at 05/26/2021  8:53 AM EDT -----  ----- Message ----- From: Netta Neat., NP Sent: 05/24/2021   7:48 PM EDT To: Lesle Chris, LPN  Please call the patient and let him know the echocardiogram shows good pumping function of the heart. The main pumping chamber is a little muscular and stiff ( this was present on previous echocardiogram). The best management  is to keep BP at or below 130/80 or less consistently and manage all other risk factors. Nothing significant on echo which would contibute to SOB or dyspnea.  Netta Neat, NP  05/24/2021 7:42 PM

## 2021-05-28 NOTE — Telephone Encounter (Signed)
Wife, Carollee Herter informed Copy sent to PCP

## 2021-05-29 ENCOUNTER — Other Ambulatory Visit: Payer: BC Managed Care – PPO

## 2021-06-18 ENCOUNTER — Institutional Professional Consult (permissible substitution): Payer: BC Managed Care – PPO | Admitting: Pulmonary Disease

## 2021-07-23 ENCOUNTER — Encounter: Payer: Self-pay | Admitting: Pulmonary Disease

## 2021-07-23 ENCOUNTER — Ambulatory Visit (INDEPENDENT_AMBULATORY_CARE_PROVIDER_SITE_OTHER): Payer: BC Managed Care – PPO | Admitting: Pulmonary Disease

## 2021-07-23 ENCOUNTER — Other Ambulatory Visit: Payer: Self-pay

## 2021-07-23 DIAGNOSIS — G4733 Obstructive sleep apnea (adult) (pediatric): Secondary | ICD-10-CM | POA: Insufficient documentation

## 2021-07-23 DIAGNOSIS — E668 Other obesity: Secondary | ICD-10-CM | POA: Diagnosis not present

## 2021-07-23 NOTE — Progress Notes (Signed)
Subjective:    Patient ID: Alex Barnes, male    DOB: [birth year], 50 y.o.   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 He reports excessive daytime somnolence, multiple nocturnal awakenings, nonrefreshing sleep.  His wife has noted loud snoring over the years and occasionally seen him stop breathing in his sleep. Epworth sleepiness score is 18 and reports sleepiness as a passenger in a car, sitting and reading although he has not time to nap at work.  He gets home around 7 PM and after dinner will nap for about an hour.  He then has trouble falling asleep at bedtime between 10 and 11 PM, sleep latency can be 1 to 2 hours.  He generally lies on his back and rolls over on his side, reports more than 5 nocturnal awakenings including nocturia and is out of bed at 6:30 AM feeling tired and groggy with occasional headaches and dryness of mouth. He has gained 40 pounds over the last 5 years  PMH-CAD status post stent 2016, low testosterone, hypertension    Past Medical History:  Diagnosis Date   CAD (coronary artery disease)    cath 9/26 & 07/31/2015 100% total occlusion of post atrio branch of RCA, LV supplied by collaterals from the L atrial recurrent branch of distal LCx, 75-80% mid to distal LCx stenosis, high grade complex stenosis of OM2, patent LAD and RI, EF 55-60%. On 9/8, single DES to midLCx extending into OM2.   Headache    "weekly" (07/29/2015)   Hypercholesteremia    NSTEMI (non-ST elevated myocardial infarction) (HCC) 07/28/2015   Past Surgical History:  Procedure Laterality Date   CARDIAC CATHETERIZATION  2006; 07/29/2015   CARDIAC CATHETERIZATION N/A 07/29/2015   Procedure: Left Heart Cath and Coronary Angiography;  Surgeon: Lyn Records, MD;  Location: [facility] INVASIVE CV LAB;   Service: Cardiovascular;  Laterality: N/A;   CARDIAC CATHETERIZATION N/A 07/31/2015   Procedure: Coronary Stent Intervention;  Surgeon: Iran Ouch, MD;  Location: [facility] INVASIVE CV LAB;  Service: Cardiovascular;  Laterality: N/A;   CARDIAC CATHETERIZATION N/A 07/31/2015   Procedure: Left Heart Cath and Coronary Angiography;  Surgeon: Iran Ouch, MD;  Location: [facility] INVASIVE CV LAB;  Service: Cardiovascular;  Laterality: N/A;   CHOLECYSTECTOMY     TONSILLECTOMY  ~ 1989   WISDOM TOOTH EXTRACTION  1993    No Known Allergies  Social History   Socioeconomic History   Marital status: Married    Spouse name: Not on file   Number of children: Not on file   Years of education: Not on file   Highest education level: Not on file  Occupational History   Not on file  Tobacco Use   Smoking status: Some Days    Packs/day: 0.50    Years: 10.00    Pack years: 5.00    Types: Cigarettes, Cigars    Last attempt to quit: 03/08/2020    Years since quitting: 1.3   Smokeless tobacco: Former   Tobacco comments:    "quit smoking cigarettes in 1998"  Vaping Use   Vaping Use: Never used  Substance and Sexual Activity   Alcohol use: Yes    Alcohol/week: 0.0 standard drinks    Comment: 6 drinks weekly    Drug use: No  Sexual activity: Yes  Other Topics Concern   Not on file  Social History Narrative   Not on file   Social Determinants of Health   Financial Resource Strain: Not on file  Food Insecurity: Not on file  Transportation Needs: Not on file  Physical Activity: Not on file  Stress: Not on file  Social Connections: Not on file  Intimate Partner Violence: Not on file     Family History  Problem Relation Age of Onset   Cancer Father       Review of Systems Shortness of breath with activity Headaches Anxiety   Constitutional: negative for anorexia, fevers and sweats  Eyes: negative for irritation, redness and visual disturbance  Ears, nose, mouth, throat, and face:  negative for earaches, epistaxis, nasal congestion and sore throat  Respiratory: negative for cough, sputum and wheezing  Cardiovascular: negative for chest pain, dyspnea, lower extremity edema, orthopnea, palpitations and syncope  Gastrointestinal: negative for abdominal pain, constipation, diarrhea, melena, nausea and vomiting  Genitourinary:negative for dysuria, frequency and hematuria  Hematologic/lymphatic: negative for bleeding, easy bruising and lymphadenopathy  Musculoskeletal:negative for arthralgias, muscle weakness and stiff joints  Neurological: negative for coordination problems, gait problems and weakness  Endocrine: negative for diabetic symptoms including polydipsia, polyuria and weight loss     Objective:   Physical Exam        Assessment & Plan:

## 2021-07-23 NOTE — Assessment & Plan Note (Signed)
Weight loss encouraged, unfortunately he is in a vicious cycle with low energy and nonrefreshing sleep and is unable to participate in a weight loss program.  Hopeful that if he can break the cycle with CPAP, his efforts at weight loss will be more effective

## 2021-07-23 NOTE — Assessment & Plan Note (Signed)
Given excessive daytime somnolence, narrow pharyngeal exam, witnessed apneas & loud snoring, obstructive sleep apnea is very likely & an overnight polysomnogram will be scheduled as a home study. The pathophysiology of obstructive sleep apnea , it's cardiovascular consequences & modes of treatment including CPAP were discused with the patient in detail & they evidenced understanding. Pretest probability is high and he would be willing to use a CPAP if needed

## 2021-07-23 NOTE — Patient Instructions (Signed)
Home sleep test 

## 2021-08-01 ENCOUNTER — Ambulatory Visit: Payer: BC Managed Care – PPO | Admitting: Cardiology

## 2021-08-11 ENCOUNTER — Other Ambulatory Visit: Payer: Self-pay | Admitting: Cardiology

## 2021-08-12 ENCOUNTER — Ambulatory Visit: Payer: BC Managed Care – PPO | Admitting: Cardiology

## 2021-08-22 NOTE — H&P (View-Only) (Signed)
Interventional cardiology note:   Date:  08/28/2021   ID:  Alex Barnes, DOB Mar 19, 1971, MRN [id number]  PCP:  Alex Chimera, MD  Cardiologist:   Alex Brawley [place], MD   Chief Complaint  Patient presents with   Coronary Artery Disease      History of Present Illness: Alex Barnes is a 50 y.o. male who is referred by Dr Wyline Mood for CAD with known CTO of the RCA to consider whether he is a candidate for PCI.  2016 Cardiac catheterization with totally occluded RCA PL branch as well as significant LCx and OM disease.  Status post DES to LCx and OM 2, PL occlusion managed medically.  Echocardiogram 2016 LVEF 55 to 60%, no WMA's.  Treated for post MI pericarditis with colchicine aspirin and NSAIDs. On follow up he has had persistent chest pain despite medical therapy. Last Myoview in 2018 showed inferior infarct with peri infarct ischemia and normal EF at good exercise tolerance. Echo in July showed Normal LV function. He apparently had cardiac cath in May 2021 at [facility] showing occlusion of RCA without any new disease. Those Barnes are not available.   He reports symptoms of left precordial chest pain with exertion or stress. Sometimes radiates to his left arm and is associated with SOB and feeling clammy. Typically relieved with rest but may take 30 minutes to 4 hours to go away. Does get relief with Ntg but this gives him a severe HA so he doesn't like to take it. Has tried isosorbide with similar HA. Now on amlodipine and metoprolol. Feels his symptoms are getting worse and is negatively impacting his quality of life.   Past Medical History:  Diagnosis Date   CAD (coronary artery disease)    cath 9/26 & 07/31/2015 100% total occlusion of post atrio branch of RCA, LV supplied by collaterals from the L atrial recurrent branch of distal LCx, 75-80% mid to distal LCx stenosis, high grade complex stenosis of OM2, patent LAD and RI, EF 55-60%. On 9/8, single DES to midLCx extending  into OM2.   Headache    "weekly" (07/29/2015)   Hypercholesteremia    NSTEMI (non-ST elevated myocardial infarction) (HCC) 07/28/2015    Past Surgical History:  Procedure Laterality Date   CARDIAC CATHETERIZATION  2006; 07/29/2015   CARDIAC CATHETERIZATION N/A 07/29/2015   Procedure: Left Heart Cath and Coronary Angiography;  Surgeon: Alex Records, MD;  Location: [facility] INVASIVE CV LAB;  Service: Cardiovascular;  Laterality: N/A;   CARDIAC CATHETERIZATION N/A 07/31/2015   Procedure: Coronary Stent Intervention;  Surgeon: Alex Ouch, MD;  Location: [facility] INVASIVE CV LAB;  Service: Cardiovascular;  Laterality: N/A;   CARDIAC CATHETERIZATION N/A 07/31/2015   Procedure: Left Heart Cath and Coronary Angiography;  Surgeon: Alex Ouch, MD;  Location: [facility] INVASIVE CV LAB;  Service: Cardiovascular;  Laterality: N/A;   CHOLECYSTECTOMY     TONSILLECTOMY  ~ 1989   WISDOM TOOTH EXTRACTION  1993     Current Outpatient Medications  Medication Sig Dispense Refill   amLODipine (NORVASC) 5 MG tablet Take 1 tablet (5 mg total) by mouth daily. 90 tablet 1   aspirin 81 MG chewable tablet Chew 1 tablet (81 mg total) by mouth daily.     ezetimibe (ZETIA) 10 MG tablet Take 10 mg by mouth daily.     lisinopril (ZESTRIL) 2.5 MG tablet TAKE 2 TABLETS BY MOUTH EVERY DAY 180 tablet 2   metoprolol tartrate (LOPRESSOR) 50 MG tablet  TAKE 1 TABLET BY MOUTH TWICE A DAY 180 tablet 1   nitroGLYCERIN (NITROSTAT) 0.4 MG SL tablet Place 1 tablet (0.4 mg total) under the tongue every 5 (five) minutes as needed for chest pain. 25 tablet 3   pantoprazole (PROTONIX) 40 MG tablet Take 40 mg by mouth daily.     rosuvastatin (CRESTOR) 40 MG tablet Take 1 tablet (40 mg total) by mouth daily. 90 tablet 3   testosterone cypionate (DEPOTESTOSTERONE CYPIONATE) 200 MG/ML injection Inject 1 mL into the muscle every 14 (fourteen) days.     No current facility-administered medications for this visit.    Allergies:   Patient has no  known allergies.    Social History:  The patient  reports that he has been smoking cigarettes and cigars. He has a 5.00 pack-year smoking history. He has quit using smokeless tobacco. He reports current alcohol use. He reports that he does not use drugs.   Family History:  The patient's family history includes Cancer in his father.    ROS:  Please see the history of present illness.   Otherwise, review of systems are positive for none.   All other systems are reviewed and negative.    PHYSICAL EXAM: VS:  BP (!) 146/82 (BP Location: Left Arm, Patient Position: Sitting, Cuff Size: Large)   Pulse 63   Ht 5\' 10"  (1.778 m)   Wt 274 lb 12.8 oz (124.6 kg)   SpO2 97%   BMI 39.43 kg/m  , BMI Body mass index is 39.43 kg/m. GEN: Well nourished, overweight, in no acute distress HEENT: normal Neck: no JVD, carotid bruits, or masses Cardiac: RRR; no murmurs, rubs, or gallops,no edema  Respiratory:  clear to auscultation bilaterally, normal work of breathing GI: soft, nontender, nondistended, + BS MS: no deformity or atrophy Skin: warm and dry, no rash Neuro:  Strength and sensation are intact Psych: euthymic mood, full affect   EKG:  EKG is ordered today. The ekg ordered today demonstrates NSR rate 63. LVH. Old inferior infarct. Cannot rule out anterior infarct- old. I have personally reviewed and interpreted this study.    Recent Labs: No results found for requested labs within last 8760 hours.   Dated 03/03/21: cholesterol 222, triglycerides 151, HDL 38, LDL 156. Normal CBC, CMET, TSH  Lipid Panel No results found for: CHOL, TRIG, HDL, CHOLHDL, VLDL, LDLCALC, LDLDIRECT    Wt Readings from Last 3 Encounters:  08/28/21 274 lb 12.8 oz (124.6 kg)  07/23/21 277 lb 9.6 oz (125.9 kg)  04/29/21 274 lb 3.2 oz (124.4 kg)      Other studies Reviewed: Additional studies/ Barnes that were reviewed today include: . Cardiac cath 07/29/15:  Left Heart Cath and Coronary Angiography    Conclusion  Ost 2nd Mrg to 2nd Mrg lesion, 85% stenosed. Prox Cx lesion, 75% stenosed. Post Atrio lesion, 100% stenosed. Ost LAD to Prox LAD lesion, 30% stenosed. Mid RCA to Dist RCA lesion, 40% stenosed.   Recent acute inferolateral infarction now greater than 18 hours old. Angiography demonstrates total occlusion of the RCA beyond the origin of the PDA. The left ventricular branch is supplied by collaterals from the left atrial recurrent branch of the distal circumflex. 75-80% mid to distal circumflex stenosis. High-grade complex stenosis in the second obtuse marginal branch which is small to moderate in size. Widely patent LAD and a dominant branching ramus intermedius. Overall normal LV function with EF 55-60%, and inferobasal hypokinesis.     Recommendations:   *Dual antiplatelet  therapy. Will eventually need PCI on the mid circumflex and second obtuse marginal branch. Not undertaken today due to acute infarction in the right coronary territory. PCI on the circumflex can be done during this hospital stay or preferably in 7-10 days as needed elective staged procedure. High-dose statin therapy Phase I cardiac rehabilitation in a.m. IV nitroglycerin for an additional 12 hours IV hydration for an additional 12 hours Diagnostic Dominance: Right Intervention Coronary stent 07/31/15:  Coronary Stent Intervention  Left Heart Cath and Coronary Angiography   Conclusion  Post Atrio lesion, 100% stenosed. Ost LAD to Prox LAD lesion, 30% stenosed. Mid RCA to Dist RCA lesion, 40% stenosed. Ost 2nd Mrg to 2nd Mrg lesion, 90% stenosed. There is a 0% residual stenosis post intervention. A drug-eluting stent was placed. Prox Cx lesion, 75% stenosed. There is a 0% residual stenosis post intervention.   1. Continued occlusion of the right posterior AV groove artery with left to right collaterals. Severe mid left circumflex stenosis into OM 2. 2. Mildly elevated left ventricular  end-diastolic pressure. 3. Successful angioplasty and drug-eluting stent placement to the mid left circumflex extending into OM 2. I used one long stent which covered both lesions.   Recommendations: Dual antiplatelets therapy for at least one year. Aggressive treatment of risk factors.   Intervention  Implants    Myoview 04/01/17: Study Result  Narrative & Impression  Blood pressure demonstrated a normal response to exercise. Duke treadmill score of 9 consistent with low risk for major cardiac events There was no ST segment deviation noted during stress. Findings consistent with prior inferior myocardial infarction with very mild peri-infarct ischemia. This is a low risk study. Particularly when considering good functional capacity and low risk Duke treadmill score. The left ventricular ejection fraction is normal (55-65%).     Echo 05/22/21: IMPRESSIONS     1. Left ventricular ejection fraction, by estimation, is 60 to 65%. The  left ventricle has normal function. The left ventricle has no regional  wall motion abnormalities. There is mild left ventricular hypertrophy.  Left ventricular diastolic parameters  are consistent with Grade I diastolic dysfunction (impaired relaxation).   2. Right ventricular systolic function is normal. The right ventricular  size is normal.   3. The mitral valve is normal in structure. No evidence of mitral valve  regurgitation. No evidence of mitral stenosis.   4. The aortic valve has an indeterminant number of cusps. Aortic valve  regurgitation is mild.   Comparison(s): Previous Echo showed LV EF 55-60%, no RWMA, mild LVH.    ASSESSMENT AND PLAN:  1.  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 Prior stenting of LCx into OM. CTO of PL branch of RCA. Given poor quality of life we need to reevaluate options for PCI. I have recommended cardiac cath. It has been a year and a half since last  angiogram. If his LCx stent is open and he has no new disease in the LCA then he may be a candidate for CTO PCI of the distal RCA. Will plan cardiac cath tomorrow to assess. If CTO is an option this will need to be staged at a later date. The procedure and risks were reviewed including but not limited to death, myocardial infarction, stroke, arrythmias, bleeding, transfusion, emergency surgery, dye allergy, or renal dysfunction. The patient voices understanding and is agreeable to proceed.  2. Hypercholesterolemia. Last LDL not close to goal. Zetia added. Will repeat. If not at  goal will need to consider a PCSK 9 inhibitor. 3. HTN controlled.    Current medicines are reviewed at length with the patient today.  The patient does not have concerns regarding medicines.  The following changes have been made:  no change  Labs/ tests ordered today include: CBC, CMET, PT, lipid panel.    Disposition:   FU TBD based on cath results.   Signed, Nini Cavan, MD  08/28/2021 9:51 AM    [facility] [address], [place], [place], [address] Phone [phone], Fax [phone]   

## 2021-08-22 NOTE — Progress Notes (Signed)
Interventional cardiology note:   Date:  08/28/2021   ID:  Alex Barnes, DOB Mar 19, 1971, MRN [id number]  PCP:  Alex Chimera, MD  Cardiologist:   Alex Chilton [place], MD   Chief Complaint  Patient presents with   Coronary Artery Disease      History of Present Illness: Alex Barnes is a 50 y.o. male who is referred by Dr Wyline Mood for CAD with known CTO of the RCA to consider whether he is a candidate for PCI.  2016 Cardiac catheterization with totally occluded RCA PL branch as well as significant LCx and OM disease.  Status post DES to LCx and OM 2, PL occlusion managed medically.  Echocardiogram 2016 LVEF 55 to 60%, no WMA's.  Treated for post MI pericarditis with colchicine aspirin and NSAIDs. On follow up he has had persistent chest pain despite medical therapy. Last Myoview in 2018 showed inferior infarct with peri infarct ischemia and normal EF at good exercise tolerance. Echo in July showed Normal LV function. He apparently had cardiac cath in May 2021 at [facility] showing occlusion of RCA without any new disease. Those records are not available.   He reports symptoms of left precordial chest pain with exertion or stress. Sometimes radiates to his left arm and is associated with SOB and feeling clammy. Typically relieved with rest but may take 30 minutes to 4 hours to go away. Does get relief with Ntg but this gives him a severe HA so he doesn't like to take it. Has tried isosorbide with similar HA. Now on amlodipine and metoprolol. Feels his symptoms are getting worse and is negatively impacting his quality of life.   Past Medical History:  Diagnosis Date   CAD (coronary artery disease)    cath 9/26 & 07/31/2015 100% total occlusion of post atrio branch of RCA, LV supplied by collaterals from the L atrial recurrent branch of distal LCx, 75-80% mid to distal LCx stenosis, high grade complex stenosis of OM2, patent LAD and RI, EF 55-60%. On 9/8, single DES to midLCx extending  into OM2.   Headache    "weekly" (07/29/2015)   Hypercholesteremia    NSTEMI (non-ST elevated myocardial infarction) (HCC) 07/28/2015    Past Surgical History:  Procedure Laterality Date   CARDIAC CATHETERIZATION  2006; 07/29/2015   CARDIAC CATHETERIZATION N/A 07/29/2015   Procedure: Left Heart Cath and Coronary Angiography;  Surgeon: Lyn Records, MD;  Location: [facility] INVASIVE CV LAB;  Service: Cardiovascular;  Laterality: N/A;   CARDIAC CATHETERIZATION N/A 07/31/2015   Procedure: Coronary Stent Intervention;  Surgeon: Iran Ouch, MD;  Location: [facility] INVASIVE CV LAB;  Service: Cardiovascular;  Laterality: N/A;   CARDIAC CATHETERIZATION N/A 07/31/2015   Procedure: Left Heart Cath and Coronary Angiography;  Surgeon: Iran Ouch, MD;  Location: [facility] INVASIVE CV LAB;  Service: Cardiovascular;  Laterality: N/A;   CHOLECYSTECTOMY     TONSILLECTOMY  ~ 1989   WISDOM TOOTH EXTRACTION  1993     Current Outpatient Medications  Medication Sig Dispense Refill   amLODipine (NORVASC) 5 MG tablet Take 1 tablet (5 mg total) by mouth daily. 90 tablet 1   aspirin 81 MG chewable tablet Chew 1 tablet (81 mg total) by mouth daily.     ezetimibe (ZETIA) 10 MG tablet Take 10 mg by mouth daily.     lisinopril (ZESTRIL) 2.5 MG tablet TAKE 2 TABLETS BY MOUTH EVERY DAY 180 tablet 2   metoprolol tartrate (LOPRESSOR) 50 MG tablet  TAKE 1 TABLET BY MOUTH TWICE A DAY 180 tablet 1   nitroGLYCERIN (NITROSTAT) 0.4 MG SL tablet Place 1 tablet (0.4 mg total) under the tongue every 5 (five) minutes as needed for chest pain. 25 tablet 3   pantoprazole (PROTONIX) 40 MG tablet Take 40 mg by mouth daily.     rosuvastatin (CRESTOR) 40 MG tablet Take 1 tablet (40 mg total) by mouth daily. 90 tablet 3   testosterone cypionate (DEPOTESTOSTERONE CYPIONATE) 200 MG/ML injection Inject 1 mL into the muscle every 14 (fourteen) days.     No current facility-administered medications for this visit.    Allergies:   Patient has no  known allergies.    Social History:  The patient  reports that he has been smoking cigarettes and cigars. He has a 5.00 pack-year smoking history. He has quit using smokeless tobacco. He reports current alcohol use. He reports that he does not use drugs.   Family History:  The patient's family history includes Cancer in his father.    ROS:  Please see the history of present illness.   Otherwise, review of systems are positive for none.   All other systems are reviewed and negative.    PHYSICAL EXAM: VS:  BP (!) 146/82 (BP Location: Left Arm, Patient Position: Sitting, Cuff Size: Large)   Pulse 63   Ht 5\' 10"  (1.778 m)   Wt 274 lb 12.8 oz (124.6 kg)   SpO2 97%   BMI 39.43 kg/m  , BMI Body mass index is 39.43 kg/m. GEN: Well nourished, overweight, in no acute distress HEENT: normal Neck: no JVD, carotid bruits, or masses Cardiac: RRR; no murmurs, rubs, or gallops,no edema  Respiratory:  clear to auscultation bilaterally, normal work of breathing GI: soft, nontender, nondistended, + BS MS: no deformity or atrophy Skin: warm and dry, no rash Neuro:  Strength and sensation are intact Psych: euthymic mood, full affect   EKG:  EKG is ordered today. The ekg ordered today demonstrates NSR rate 63. LVH. Old inferior infarct. Cannot rule out anterior infarct- old. I have personally reviewed and interpreted this study.    Recent Labs: No results found for requested labs within last 8760 hours.   Dated 03/03/21: cholesterol 222, triglycerides 151, HDL 38, LDL 156. Normal CBC, CMET, TSH  Lipid Panel No results found for: CHOL, TRIG, HDL, CHOLHDL, VLDL, LDLCALC, LDLDIRECT    Wt Readings from Last 3 Encounters:  08/28/21 274 lb 12.8 oz (124.6 kg)  07/23/21 277 lb 9.6 oz (125.9 kg)  04/29/21 274 lb 3.2 oz (124.4 kg)      Other studies Reviewed: Additional studies/ records that were reviewed today include: . Cardiac cath 07/29/15:  Left Heart Cath and Coronary Angiography    Conclusion  Ost 2nd Mrg to 2nd Mrg lesion, 85% stenosed. Prox Cx lesion, 75% stenosed. Post Atrio lesion, 100% stenosed. Ost LAD to Prox LAD lesion, 30% stenosed. Mid RCA to Dist RCA lesion, 40% stenosed.   Recent acute inferolateral infarction now greater than 18 hours old. Angiography demonstrates total occlusion of the RCA beyond the origin of the PDA. The left ventricular branch is supplied by collaterals from the left atrial recurrent branch of the distal circumflex. 75-80% mid to distal circumflex stenosis. High-grade complex stenosis in the second obtuse marginal branch which is small to moderate in size. Widely patent LAD and a dominant branching ramus intermedius. Overall normal LV function with EF 55-60%, and inferobasal hypokinesis.     Recommendations:   *Dual antiplatelet  therapy. Will eventually need PCI on the mid circumflex and second obtuse marginal branch. Not undertaken today due to acute infarction in the right coronary territory. PCI on the circumflex can be done during this hospital stay or preferably in 7-10 days as needed elective staged procedure. High-dose statin therapy Phase I cardiac rehabilitation in a.m. IV nitroglycerin for an additional 12 hours IV hydration for an additional 12 hours Diagnostic Dominance: Right Intervention Coronary stent 07/31/15:  Coronary Stent Intervention  Left Heart Cath and Coronary Angiography   Conclusion  Post Atrio lesion, 100% stenosed. Ost LAD to Prox LAD lesion, 30% stenosed. Mid RCA to Dist RCA lesion, 40% stenosed. Ost 2nd Mrg to 2nd Mrg lesion, 90% stenosed. There is a 0% residual stenosis post intervention. A drug-eluting stent was placed. Prox Cx lesion, 75% stenosed. There is a 0% residual stenosis post intervention.   1. Continued occlusion of the right posterior AV groove artery with left to right collaterals. Severe mid left circumflex stenosis into OM 2. 2. Mildly elevated left ventricular  end-diastolic pressure. 3. Successful angioplasty and drug-eluting stent placement to the mid left circumflex extending into OM 2. I used one long stent which covered both lesions.   Recommendations: Dual antiplatelets therapy for at least one year. Aggressive treatment of risk factors.   Intervention  Implants    Myoview 04/01/17: Study Result  Narrative & Impression  Blood pressure demonstrated a normal response to exercise. Duke treadmill score of 9 consistent with low risk for major cardiac events There was no ST segment deviation noted during stress. Findings consistent with prior inferior myocardial infarction with very mild peri-infarct ischemia. This is a low risk study. Particularly when considering good functional capacity and low risk Duke treadmill score. The left ventricular ejection fraction is normal (55-65%).     Echo 05/22/21: IMPRESSIONS     1. Left ventricular ejection fraction, by estimation, is 60 to 65%. The  left ventricle has normal function. The left ventricle has no regional  wall motion abnormalities. There is mild left ventricular hypertrophy.  Left ventricular diastolic parameters  are consistent with Grade I diastolic dysfunction (impaired relaxation).   2. Right ventricular systolic function is normal. The right ventricular  size is normal.   3. The mitral valve is normal in structure. No evidence of mitral valve  regurgitation. No evidence of mitral stenosis.   4. The aortic valve has an indeterminant number of cusps. Aortic valve  regurgitation is mild.   Comparison(s): Previous Echo showed LV EF 55-60%, no RWMA, mild LVH.    ASSESSMENT AND PLAN:  1.  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 Prior stenting of LCx into OM. CTO of PL branch of RCA. Given poor quality of life we need to reevaluate options for PCI. I have recommended cardiac cath. It has been a year and a half since last  angiogram. If his LCx stent is open and he has no new disease in the LCA then he may be a candidate for CTO PCI of the distal RCA. Will plan cardiac cath tomorrow to assess. If CTO is an option this will need to be staged at a later date. The procedure and risks were reviewed including but not limited to death, myocardial infarction, stroke, arrythmias, bleeding, transfusion, emergency surgery, dye allergy, or renal dysfunction. The patient voices understanding and is agreeable to proceed.  2. Hypercholesterolemia. Last LDL not close to goal. Zetia added. Will repeat. If not at  goal will need to consider a PCSK 9 inhibitor. 3. HTN controlled.    Current medicines are reviewed at length with the patient today.  The patient does not have concerns regarding medicines.  The following changes have been made:  no change  Labs/ tests ordered today include: CBC, CMET, PT, lipid panel.    Disposition:   FU TBD based on cath results.   Signed, Honestee Revard, MD  08/28/2021 9:51 AM    [facility] [address], [place], [place], [address] Phone [phone], Fax [phone]   

## 2021-08-22 NOTE — H&P (View-Only) (Signed)
Interventional cardiology note:   Date:  08/28/2021   ID:  Alex Barnes, DOB Mar 19, 1971, MRN [id number]  PCP:  Richardean Chimera, MD  Cardiologist:   Itati Brocksmith [place], MD   Chief Complaint  Patient presents with   Coronary Artery Disease      History of Present Illness: Alex Barnes is a 51 y.o. male who is referred by Dr Wyline Mood for CAD with known CTO of the RCA to consider whether he is a candidate for PCI.  2016 Cardiac catheterization with totally occluded RCA PL branch as well as significant LCx and OM disease.  Status post DES to LCx and OM 2, PL occlusion managed medically.  Echocardiogram 2016 LVEF 55 to 60%, no WMA's.  Treated for post MI pericarditis with colchicine aspirin and NSAIDs. On follow up he has had persistent chest pain despite medical therapy. Last Myoview in 2018 showed inferior infarct with peri infarct ischemia and normal EF at good exercise tolerance. Echo in July showed Normal LV function. He apparently had cardiac cath in May 2021 at [facility] showing occlusion of RCA without any new disease. Those records are not available.   He reports symptoms of left precordial chest pain with exertion or stress. Sometimes radiates to his left arm and is associated with SOB and feeling clammy. Typically relieved with rest but may take 30 minutes to 4 hours to go away. Does get relief with Ntg but this gives him a severe HA so he doesn't like to take it. Has tried isosorbide with similar HA. Now on amlodipine and metoprolol. Feels his symptoms are getting worse and is negatively impacting his quality of life.   Past Medical History:  Diagnosis Date   CAD (coronary artery disease)    cath 9/26 & 07/31/2015 100% total occlusion of post atrio branch of RCA, LV supplied by collaterals from the L atrial recurrent branch of distal LCx, 75-80% mid to distal LCx stenosis, high grade complex stenosis of OM2, patent LAD and RI, EF 55-60%. On 9/8, single DES to midLCx extending  into OM2.   Headache    "weekly" (07/29/2015)   Hypercholesteremia    NSTEMI (non-ST elevated myocardial infarction) (HCC) 07/28/2015    Past Surgical History:  Procedure Laterality Date   CARDIAC CATHETERIZATION  2006; 07/29/2015   CARDIAC CATHETERIZATION N/A 07/29/2015   Procedure: Left Heart Cath and Coronary Angiography;  Surgeon: Lyn Records, MD;  Location: [facility] INVASIVE CV LAB;  Service: Cardiovascular;  Laterality: N/A;   CARDIAC CATHETERIZATION N/A 07/31/2015   Procedure: Coronary Stent Intervention;  Surgeon: Iran Ouch, MD;  Location: [facility] INVASIVE CV LAB;  Service: Cardiovascular;  Laterality: N/A;   CARDIAC CATHETERIZATION N/A 07/31/2015   Procedure: Left Heart Cath and Coronary Angiography;  Surgeon: Iran Ouch, MD;  Location: [facility] INVASIVE CV LAB;  Service: Cardiovascular;  Laterality: N/A;   CHOLECYSTECTOMY     TONSILLECTOMY  ~ 1989   WISDOM TOOTH EXTRACTION  1993     Current Outpatient Medications  Medication Sig Dispense Refill   amLODipine (NORVASC) 5 MG tablet Take 1 tablet (5 mg total) by mouth daily. 90 tablet 1   aspirin 81 MG chewable tablet Chew 1 tablet (81 mg total) by mouth daily.     ezetimibe (ZETIA) 10 MG tablet Take 10 mg by mouth daily.     lisinopril (ZESTRIL) 2.5 MG tablet TAKE 2 TABLETS BY MOUTH EVERY DAY 180 tablet 2   metoprolol tartrate (LOPRESSOR) 50 MG tablet  TAKE 1 TABLET BY MOUTH TWICE A DAY 180 tablet 1   nitroGLYCERIN (NITROSTAT) 0.4 MG SL tablet Place 1 tablet (0.4 mg total) under the tongue every 5 (five) minutes as needed for chest pain. 25 tablet 3   pantoprazole (PROTONIX) 40 MG tablet Take 40 mg by mouth daily.     rosuvastatin (CRESTOR) 40 MG tablet Take 1 tablet (40 mg total) by mouth daily. 90 tablet 3   testosterone cypionate (DEPOTESTOSTERONE CYPIONATE) 200 MG/ML injection Inject 1 mL into the muscle every 14 (fourteen) days.     No current facility-administered medications for this visit.    Allergies:   Patient has no  known allergies.    Social History:  The patient  reports that he has been smoking cigarettes and cigars. He has a 5.00 pack-year smoking history. He has quit using smokeless tobacco. He reports current alcohol use. He reports that he does not use drugs.   Family History:  The patient's family history includes Cancer in his father.    ROS:  Please see the history of present illness.   Otherwise, review of systems are positive for none.   All other systems are reviewed and negative.    PHYSICAL EXAM: VS:  BP (!) 146/82 (BP Location: Left Arm, Patient Position: Sitting, Cuff Size: Large)   Pulse 63   Ht 5\' 10"  (1.778 m)   Wt 274 lb 12.8 oz (124.6 kg)   SpO2 97%   BMI 39.43 kg/m  , BMI Body mass index is 39.43 kg/m. GEN: Well nourished, overweight, in no acute distress HEENT: normal Neck: no JVD, carotid bruits, or masses Cardiac: RRR; no murmurs, rubs, or gallops,no edema  Respiratory:  clear to auscultation bilaterally, normal work of breathing GI: soft, nontender, nondistended, + BS MS: no deformity or atrophy Skin: warm and dry, no rash Neuro:  Strength and sensation are intact Psych: euthymic mood, full affect   EKG:  EKG is ordered today. The ekg ordered today demonstrates NSR rate 63. LVH. Old inferior infarct. Cannot rule out anterior infarct- old. I have personally reviewed and interpreted this study.    Recent Labs: No results found for requested labs within last 8760 hours.   Dated 03/03/21: cholesterol 222, triglycerides 151, HDL 38, LDL 156. Normal CBC, CMET, TSH  Lipid Panel No results found for: CHOL, TRIG, HDL, CHOLHDL, VLDL, LDLCALC, LDLDIRECT    Wt Readings from Last 3 Encounters:  08/28/21 274 lb 12.8 oz (124.6 kg)  07/23/21 277 lb 9.6 oz (125.9 kg)  04/29/21 274 lb 3.2 oz (124.4 kg)      Other studies Reviewed: Additional studies/ records that were reviewed today include: . Cardiac cath 07/29/15:  Left Heart Cath and Coronary Angiography    Conclusion  Ost 2nd Mrg to 2nd Mrg lesion, 85% stenosed. Prox Cx lesion, 75% stenosed. Post Atrio lesion, 100% stenosed. Ost LAD to Prox LAD lesion, 30% stenosed. Mid RCA to Dist RCA lesion, 40% stenosed.   Recent acute inferolateral infarction now greater than 18 hours old. Angiography demonstrates total occlusion of the RCA beyond the origin of the PDA. The left ventricular branch is supplied by collaterals from the left atrial recurrent branch of the distal circumflex. 75-80% mid to distal circumflex stenosis. High-grade complex stenosis in the second obtuse marginal branch which is small to moderate in size. Widely patent LAD and a dominant branching ramus intermedius. Overall normal LV function with EF 55-60%, and inferobasal hypokinesis.     Recommendations:   *Dual antiplatelet  therapy. Will eventually need PCI on the mid circumflex and second obtuse marginal branch. Not undertaken today due to acute infarction in the right coronary territory. PCI on the circumflex can be done during this hospital stay or preferably in 7-10 days as needed elective staged procedure. High-dose statin therapy Phase I cardiac rehabilitation in a.m. IV nitroglycerin for an additional 12 hours IV hydration for an additional 12 hours Diagnostic Dominance: Right Intervention Coronary stent 07/31/15:  Coronary Stent Intervention  Left Heart Cath and Coronary Angiography   Conclusion  Post Atrio lesion, 100% stenosed. Ost LAD to Prox LAD lesion, 30% stenosed. Mid RCA to Dist RCA lesion, 40% stenosed. Ost 2nd Mrg to 2nd Mrg lesion, 90% stenosed. There is a 0% residual stenosis post intervention. A drug-eluting stent was placed. Prox Cx lesion, 75% stenosed. There is a 0% residual stenosis post intervention.   1. Continued occlusion of the right posterior AV groove artery with left to right collaterals. Severe mid left circumflex stenosis into OM 2. 2. Mildly elevated left ventricular  end-diastolic pressure. 3. Successful angioplasty and drug-eluting stent placement to the mid left circumflex extending into OM 2. I used one long stent which covered both lesions.   Recommendations: Dual antiplatelets therapy for at least one year. Aggressive treatment of risk factors.   Intervention  Implants    Myoview 04/01/17: Study Result  Narrative & Impression  Blood pressure demonstrated a normal response to exercise. Duke treadmill score of 9 consistent with low risk for major cardiac events There was no ST segment deviation noted during stress. Findings consistent with prior inferior myocardial infarction with very mild peri-infarct ischemia. This is a low risk study. Particularly when considering good functional capacity and low risk Duke treadmill score. The left ventricular ejection fraction is normal (55-65%).     Echo 05/22/21: IMPRESSIONS     1. Left ventricular ejection fraction, by estimation, is 60 to 65%. The  left ventricle has normal function. The left ventricle has no regional  wall motion abnormalities. There is mild left ventricular hypertrophy.  Left ventricular diastolic parameters  are consistent with Grade I diastolic dysfunction (impaired relaxation).   2. Right ventricular systolic function is normal. The right ventricular  size is normal.   3. The mitral valve is normal in structure. No evidence of mitral valve  regurgitation. No evidence of mitral stenosis.   4. The aortic valve has an indeterminant number of cusps. Aortic valve  regurgitation is mild.   Comparison(s): Previous Echo showed LV EF 55-60%, no RWMA, mild LVH.    ASSESSMENT AND PLAN:  1.  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 Prior stenting of LCx into OM. CTO of PL branch of RCA. Given poor quality of life we need to reevaluate options for PCI. I have recommended cardiac cath. It has been a year and a half since last  angiogram. If his LCx stent is open and he has no new disease in the LCA then he may be a candidate for CTO PCI of the distal RCA. Will plan cardiac cath tomorrow to assess. If CTO is an option this will need to be staged at a later date. The procedure and risks were reviewed including but not limited to death, myocardial infarction, stroke, arrythmias, bleeding, transfusion, emergency surgery, dye allergy, or renal dysfunction. The patient voices understanding and is agreeable to proceed.  2. Hypercholesterolemia. Last LDL not close to goal. Zetia added. Will repeat. If not at  goal will need to consider a PCSK 9 inhibitor. 3. HTN controlled.    Current medicines are reviewed at length with the patient today.  The patient does not have concerns regarding medicines.  The following changes have been made:  no change  Labs/ tests ordered today include: CBC, CMET, PT, lipid panel.    Disposition:   FU TBD based on cath results.   Signed, Leshawn Straka [place], MD  08/28/2021 9:51 AM    [facility] [address] [place], [place], [address] Phone [phone], Fax [phone]

## 2021-08-25 ENCOUNTER — Telehealth: Payer: Self-pay

## 2021-08-25 NOTE — Telephone Encounter (Signed)
Cath films requested from Cath lab at [facility] [phone] ).To be mailed overnight.

## 2021-08-28 ENCOUNTER — Other Ambulatory Visit: Payer: Self-pay

## 2021-08-28 ENCOUNTER — Ambulatory Visit: Payer: BC Managed Care – PPO | Admitting: Cardiology

## 2021-08-28 ENCOUNTER — Encounter: Payer: Self-pay | Admitting: Cardiology

## 2021-08-28 ENCOUNTER — Other Ambulatory Visit: Payer: Self-pay | Admitting: Cardiology

## 2021-08-28 VITALS — BP 146/82 | HR 63 | Ht 70.0 in | Wt 274.8 lb

## 2021-08-28 DIAGNOSIS — I208 Other forms of angina pectoris: Secondary | ICD-10-CM

## 2021-08-28 DIAGNOSIS — I10 Essential (primary) hypertension: Secondary | ICD-10-CM

## 2021-08-28 DIAGNOSIS — E782 Mixed hyperlipidemia: Secondary | ICD-10-CM

## 2021-08-28 DIAGNOSIS — I2089 Other forms of angina pectoris: Secondary | ICD-10-CM

## 2021-08-28 DIAGNOSIS — I209 Angina pectoris, unspecified: Secondary | ICD-10-CM

## 2021-08-28 DIAGNOSIS — I2510 Atherosclerotic heart disease of native coronary artery without angina pectoris: Secondary | ICD-10-CM

## 2021-08-28 MED ORDER — AMLODIPINE BESYLATE 5 MG PO TABS: 5.0000 mg | ORAL_TABLET | Freq: Every day | ORAL | 3 refills | Status: DC

## 2021-08-28 MED ORDER — SODIUM CHLORIDE 0.9% FLUSH: 3.0000 mL | Freq: Two times a day (BID) | INTRAVENOUS | Status: DC

## 2021-08-28 NOTE — Patient Instructions (Addendum)
Medication Instructions:  Continue same medications   Lab Work: Cmet,lipid panel,cbc,pt today   Testing/Procedures: Cardiac Cath scheduled Friday 10/28 at [facility]    Follow instructions below   Follow-Up: At [facility], you and your health needs are our priority.  As part of our continuing mission to provide you with exceptional heart care, we have created designated Provider Care Teams.  These Care Teams include your primary Cardiologist (physician) and Advanced Practice Providers (APPs -  Physician Assistants and Nurse Practitioners) who all work together to provide you with the care you need, when you need it.  We recommend signing up for the patient portal called "MyChart".  Sign up information is provided on this After Visit Summary.  MyChart is used to connect with patients for Virtual Visits (Telemedicine).  Patients are able to view lab/test results, encounter notes, upcoming appointments, etc.  Non-urgent messages can be sent to your provider as well.   To learn more about what you can do with MyChart, go to [email or web address].      Your next appointment:  Wednesday  11/30 at 2:30 pm    The format for your next appointment:Office   Provider:  Dr.Jordan        [facility] CARDIOVASCULAR DIVISION [facility] [address] [place] [place] [address] Dept: [phone] Loc: [phone]  KIETH HARTIS  08/28/2021  You are scheduled for a Cardiac Cath on Friday 08/29/21, with Dr.Jordan.  1. Please arrive at the [facility] (Main Entrance A) at [facility]: [address] [place], [place] [address] at 7:00 am (This time is two hours before your procedure to ensure your preparation). Free valet parking service is available.   Special note: Every effort is made to have your procedure done on time. Please understand that emergencies sometimes delay scheduled procedures.  2. Diet: Do not eat solid  foods after midnight.  The patient may have clear liquids until 5am upon the day of the procedure.  3. Labs: You will need to have blood drawn on Thursday 10/27 at Dr.Jordan's office [facility]  4. Medication instructions in preparation for your procedure:        On the morning of your procedure, take your Aspirin 81 mg and any morning medicines NOT listed above.  You may use sips of water.  5. Plan for one night stay--bring personal belongings. 6. Bring a current list of your medications and current insurance cards. 7. You MUST have a responsible person to drive you home. 8. Someone MUST be with you the first 24 hours after you arrive home or your discharge will be delayed. 9. Please wear clothes that are easy to get on and off and wear slip-on shoes.  Thank you for allowing [place] to care for you!   -- [place] [facility]

## 2021-08-28 NOTE — Addendum Note (Signed)
Addended by: Neoma Laming on: 08/28/2021 10:07 AM   Modules accepted: Orders

## 2021-08-29 ENCOUNTER — Ambulatory Visit: Payer: Self-pay

## 2021-08-29 ENCOUNTER — Encounter (HOSPITAL_COMMUNITY)
Admission: RE | Disposition: A | Discharge status: Home / Self Care | Payer: BC Managed Care – PPO | Source: Home / Self Care | Attending: Cardiology

## 2021-08-29 ENCOUNTER — Ambulatory Visit (HOSPITAL_COMMUNITY)
Admission: RE | Admit: 2021-08-29 | Discharge: 2021-08-29 | Disposition: A | Discharge status: Home / Self Care | Payer: BC Managed Care – PPO | Attending: Cardiology | Admitting: Cardiology

## 2021-08-29 ENCOUNTER — Other Ambulatory Visit: Payer: Self-pay

## 2021-08-29 ENCOUNTER — Encounter (HOSPITAL_COMMUNITY): Payer: Self-pay | Admitting: Cardiology

## 2021-08-29 DIAGNOSIS — E668 Other obesity: Secondary | ICD-10-CM | POA: Diagnosis present

## 2021-08-29 DIAGNOSIS — E669 Obesity, unspecified: Secondary | ICD-10-CM | POA: Diagnosis present

## 2021-08-29 DIAGNOSIS — I10 Essential (primary) hypertension: Secondary | ICD-10-CM | POA: Insufficient documentation

## 2021-08-29 DIAGNOSIS — F17210 Nicotine dependence, cigarettes, uncomplicated: Secondary | ICD-10-CM | POA: Insufficient documentation

## 2021-08-29 DIAGNOSIS — I25119 Atherosclerotic heart disease of native coronary artery with unspecified angina pectoris: Secondary | ICD-10-CM | POA: Diagnosis not present

## 2021-08-29 DIAGNOSIS — E7800 Pure hypercholesterolemia, unspecified: Secondary | ICD-10-CM | POA: Insufficient documentation

## 2021-08-29 DIAGNOSIS — Z955 Presence of coronary angioplasty implant and graft: Secondary | ICD-10-CM | POA: Diagnosis not present

## 2021-08-29 DIAGNOSIS — Z79899 Other long term (current) drug therapy: Secondary | ICD-10-CM | POA: Insufficient documentation

## 2021-08-29 DIAGNOSIS — E785 Hyperlipidemia, unspecified: Secondary | ICD-10-CM | POA: Diagnosis present

## 2021-08-29 DIAGNOSIS — I2582 Chronic total occlusion of coronary artery: Secondary | ICD-10-CM | POA: Diagnosis not present

## 2021-08-29 DIAGNOSIS — G4733 Obstructive sleep apnea (adult) (pediatric): Secondary | ICD-10-CM | POA: Diagnosis present

## 2021-08-29 DIAGNOSIS — I209 Angina pectoris, unspecified: Secondary | ICD-10-CM | POA: Diagnosis present

## 2021-08-29 DIAGNOSIS — Z7982 Long term (current) use of aspirin: Secondary | ICD-10-CM | POA: Insufficient documentation

## 2021-08-29 DIAGNOSIS — I2510 Atherosclerotic heart disease of native coronary artery without angina pectoris: Secondary | ICD-10-CM | POA: Diagnosis present

## 2021-08-29 HISTORY — PX: LEFT HEART CATH AND CORONARY ANGIOGRAPHY: CATH118249

## 2021-08-29 LAB — CBC WITH DIFFERENTIAL/PLATELET
Basophils Absolute: 0.1 10*3/uL (ref 0.0–0.2)
Basos: 1 %
EOS (ABSOLUTE): 0.1 10*3/uL (ref 0.0–0.4)
Eos: 1 %
Hematocrit: 54.7 % — ABNORMAL HIGH (ref 37.5–51.0)
Hemoglobin: 18 g/dL — ABNORMAL HIGH (ref 13.0–17.7)
Immature Grans (Abs): 0 10*3/uL (ref 0.0–0.1)
Immature Granulocytes: 0 %
Lymphocytes Absolute: 1.7 10*3/uL (ref 0.7–3.1)
Lymphs: 19 %
MCH: 30.2 pg (ref 26.6–33.0)
MCHC: 32.9 g/dL (ref 31.5–35.7)
MCV: 92 fL (ref 79–97)
Monocytes Absolute: 1.2 10*3/uL — ABNORMAL HIGH (ref 0.1–0.9)
Monocytes: 13 %
Neutrophils Absolute: 6.1 10*3/uL (ref 1.4–7.0)
Neutrophils: 66 %
Platelets: 294 10*3/uL (ref 150–450)
RBC: 5.97 x10E6/uL — ABNORMAL HIGH (ref 4.14–5.80)
RDW: 12.8 % (ref 11.6–15.4)
WBC: 9.1 10*3/uL (ref 3.4–10.8)

## 2021-08-29 LAB — COMPREHENSIVE METABOLIC PANEL
ALT: 110 IU/L — ABNORMAL HIGH (ref 0–44)
AST: 77 IU/L — ABNORMAL HIGH (ref 0–40)
Albumin/Globulin Ratio: 1.8 (ref 1.2–2.2)
Albumin: 4.9 g/dL (ref 4.0–5.0)
Alkaline Phosphatase: 77 IU/L (ref 44–121)
BUN/Creatinine Ratio: 13 (ref 9–20)
BUN: 12 mg/dL (ref 6–24)
Bilirubin Total: 0.5 mg/dL (ref 0.0–1.2)
CO2: 21 mmol/L (ref 20–29)
Calcium: 10 mg/dL (ref 8.7–10.2)
Chloride: 99 mmol/L (ref 96–106)
Creatinine, Ser: 0.91 mg/dL (ref 0.76–1.27)
Globulin, Total: 2.7 g/dL (ref 1.5–4.5)
Glucose: 106 mg/dL — ABNORMAL HIGH (ref 70–99)
Potassium: 5 mmol/L (ref 3.5–5.2)
Sodium: 135 mmol/L (ref 134–144)
Total Protein: 7.6 g/dL (ref 6.0–8.5)
eGFR: 104 mL/min/{1.73_m2} (ref >59)

## 2021-08-29 LAB — LIPID PANEL
Chol/HDL Ratio: 4.2 ratio (ref 0.0–5.0)
Cholesterol, Total: 148 mg/dL (ref 100–199)
HDL: 35 mg/dL — ABNORMAL LOW (ref >39)
LDL Chol Calc (NIH): 97 mg/dL (ref 0–99)
Triglycerides: 83 mg/dL (ref 0–149)
VLDL Cholesterol Cal: 16 mg/dL (ref 5–40)

## 2021-08-29 LAB — PT AND PTT
INR: 1 (ref 0.9–1.2)
Prothrombin Time: 10.2 s (ref 9.1–12.0)
aPTT: 33 s (ref 24–33)

## 2021-08-29 SURGERY — LEFT HEART CATH AND CORONARY ANGIOGRAPHY
Anesthesia: LOCAL

## 2021-08-29 MED ORDER — ASPIRIN 81 MG PO CHEW
81.0000 mg | CHEWABLE_TABLET | ORAL | Status: DC
Start: 2021-08-29 — End: 2021-08-29

## 2021-08-29 MED ORDER — SODIUM CHLORIDE 0.9 % WEIGHT BASED INFUSION: 1.0000 mL/kg/h | INTRAVENOUS | Status: AC

## 2021-08-29 MED ORDER — SODIUM CHLORIDE 0.9% FLUSH: 3.0000 mL | INTRAVENOUS | Status: DC | PRN

## 2021-08-29 MED ORDER — MIDAZOLAM HCL 2 MG/2ML IJ SOLN
INTRAMUSCULAR | Status: DC | PRN
  Administered 2021-08-29: 2 mg via INTRAVENOUS

## 2021-08-29 MED ORDER — HEPARIN SODIUM (PORCINE) 1000 UNIT/ML IJ SOLN
INTRAMUSCULAR | Status: AC
  Filled 2021-08-29: qty 1

## 2021-08-29 MED ORDER — ROSUVASTATIN CALCIUM 40 MG PO TABS: 20.0000 mg | ORAL_TABLET | Freq: Every day | ORAL | 3 refills | Status: DC

## 2021-08-29 MED ORDER — IOHEXOL 350 MG/ML SOLN
INTRAVENOUS | Status: DC | PRN
  Administered 2021-08-29: 85 mL

## 2021-08-29 MED ORDER — CLOPIDOGREL BISULFATE 75 MG PO TABS: 75.0000 mg | ORAL_TABLET | Freq: Every day | ORAL | 3 refills | Status: DC

## 2021-08-29 MED ORDER — ACETAMINOPHEN 325 MG PO TABS: 650.0000 mg | ORAL_TABLET | ORAL | Status: DC | PRN

## 2021-08-29 MED ORDER — SODIUM CHLORIDE 0.9% FLUSH: 3.0000 mL | Freq: Two times a day (BID) | INTRAVENOUS | Status: DC

## 2021-08-29 MED ORDER — HEPARIN (PORCINE) IN NACL 1000-0.9 UT/500ML-% IV SOLN
INTRAVENOUS | Status: DC | PRN
  Administered 2021-08-29 (×2): 500 mL

## 2021-08-29 MED ORDER — FENTANYL CITRATE (PF) 100 MCG/2ML IJ SOLN
INTRAMUSCULAR | Status: DC | PRN
  Administered 2021-08-29: 25 ug via INTRAVENOUS

## 2021-08-29 MED ORDER — SODIUM CHLORIDE 0.9 % WEIGHT BASED INFUSION: 1.0000 mL/kg/h | INTRAVENOUS | Status: DC

## 2021-08-29 MED ORDER — FENTANYL CITRATE (PF) 100 MCG/2ML IJ SOLN
INTRAMUSCULAR | Status: AC
  Filled 2021-08-29: qty 2

## 2021-08-29 MED ORDER — SODIUM CHLORIDE 0.9 % IV SOLN: 250.0000 mL | INTRAVENOUS | Status: DC | PRN

## 2021-08-29 MED ORDER — SODIUM CHLORIDE 0.9 % WEIGHT BASED INFUSION
3.0000 mL/kg/h | INTRAVENOUS | Status: AC
  Administered 2021-08-29: 3 mL/kg/h via INTRAVENOUS

## 2021-08-29 MED ORDER — LIDOCAINE HCL (PF) 1 % IJ SOLN
INTRAMUSCULAR | Status: DC | PRN
  Administered 2021-08-29: 2 mL

## 2021-08-29 MED ORDER — HYDRALAZINE HCL 20 MG/ML IJ SOLN: 10.0000 mg | INTRAMUSCULAR | Status: DC | PRN

## 2021-08-29 MED ORDER — HEPARIN (PORCINE) IN NACL 1000-0.9 UT/500ML-% IV SOLN
INTRAVENOUS | Status: AC
  Filled 2021-08-29: qty 1000

## 2021-08-29 MED ORDER — VERAPAMIL HCL 2.5 MG/ML IV SOLN
INTRAVENOUS | Status: DC | PRN
  Administered 2021-08-29: 10 mL via INTRA_ARTERIAL

## 2021-08-29 MED ORDER — VERAPAMIL HCL 2.5 MG/ML IV SOLN
INTRAVENOUS | Status: AC
  Filled 2021-08-29: qty 2

## 2021-08-29 MED ORDER — HEPARIN SODIUM (PORCINE) 1000 UNIT/ML IJ SOLN
INTRAMUSCULAR | Status: DC | PRN
  Administered 2021-08-29: 5000 [IU] via INTRAVENOUS

## 2021-08-29 MED ORDER — MIDAZOLAM HCL 2 MG/2ML IJ SOLN
INTRAMUSCULAR | Status: AC
  Filled 2021-08-29: qty 2

## 2021-08-29 MED ORDER — LIDOCAINE HCL (PF) 1 % IJ SOLN
INTRAMUSCULAR | Status: AC
  Filled 2021-08-29: qty 30

## 2021-08-29 MED ORDER — ONDANSETRON HCL 4 MG/2ML IJ SOLN: 4.0000 mg | Freq: Four times a day (QID) | INTRAMUSCULAR | Status: DC | PRN

## 2021-08-29 SURGICAL SUPPLY — 10 items
CATH 5FR JL3.5 JR4 ANG PIG MP (CATHETERS) ×2 IMPLANT
DEVICE RAD COMP TR BAND LRG (VASCULAR PRODUCTS) ×2 IMPLANT
GLIDESHEATH SLEND SS 6F .021 (SHEATH) ×2 IMPLANT
GUIDEWIRE INQWIRE 1.5J.035X260 (WIRE) ×1 IMPLANT
INQWIRE 1.5J .035X260CM (WIRE) ×2
KIT HEART LEFT (KITS) ×2 IMPLANT
PACK CARDIAC CATHETERIZATION (CUSTOM PROCEDURE TRAY) ×2 IMPLANT
SYR MEDRAD MARK 7 150ML (SYRINGE) ×2 IMPLANT
TRANSDUCER W/STOPCOCK (MISCELLANEOUS) ×2 IMPLANT
TUBING CIL FLEX 10 FLL-RA (TUBING) ×2 IMPLANT

## 2021-08-29 NOTE — Interval H&P Note (Signed)
History and Physical Interval Note:  08/29/2021 7:58 AM  Alex Barnes  has presented today for surgery, with the diagnosis of CAD.  The various methods of treatment have been discussed with the patient and family. After consideration of risks, benefits and other options for treatment, the patient has consented to  Procedure(s): LEFT HEART CATH AND CORONARY ANGIOGRAPHY (N/A) as a surgical intervention.  The patient's history has been reviewed, patient examined, no change in status, stable for surgery.  I have reviewed the patient's chart and labs.  Questions were answered to the patient's satisfaction.   Cath Lab Visit (complete for each Cath Lab visit)  Clinical Evaluation Leading to the Procedure:   ACS: Yes.    Non-ACS:    Anginal Classification: CCS III  Anti-ischemic medical therapy: Maximal Therapy (2 or more classes of medications)  Non-Invasive Test Results: No non-invasive testing performed  Prior CABG: No previous CABG        Theron Arista [facility] 08/29/2021 7:58 AM

## 2021-09-02 ENCOUNTER — Other Ambulatory Visit: Payer: Self-pay

## 2021-09-02 DIAGNOSIS — E785 Hyperlipidemia, unspecified: Secondary | ICD-10-CM

## 2021-09-02 DIAGNOSIS — I2510 Atherosclerotic heart disease of native coronary artery without angina pectoris: Secondary | ICD-10-CM

## 2021-09-03 ENCOUNTER — Other Ambulatory Visit: Payer: Self-pay | Admitting: *Deleted

## 2021-09-03 ENCOUNTER — Other Ambulatory Visit: Payer: Self-pay

## 2021-09-03 ENCOUNTER — Other Ambulatory Visit (HOSPITAL_COMMUNITY)
Admission: RE | Admit: 2021-09-03 | Discharge: 2021-09-03 | Disposition: A | Discharge status: Home / Self Care | Payer: BC Managed Care – PPO | Source: Ambulatory Visit | Attending: Cardiology | Admitting: Cardiology

## 2021-09-03 DIAGNOSIS — E785 Hyperlipidemia, unspecified: Secondary | ICD-10-CM | POA: Diagnosis not present

## 2021-09-03 DIAGNOSIS — I2510 Atherosclerotic heart disease of native coronary artery without angina pectoris: Secondary | ICD-10-CM

## 2021-09-03 DIAGNOSIS — Z01812 Encounter for preprocedural laboratory examination: Secondary | ICD-10-CM

## 2021-09-03 LAB — CBC
HCT: 45.7 % (ref 39.0–52.0)
Hemoglobin: 16 g/dL (ref 13.0–17.0)
MCH: 31.7 pg (ref 26.0–34.0)
MCHC: 35 g/dL (ref 30.0–36.0)
MCV: 90.7 fL (ref 80.0–100.0)
Platelets: 296 10*3/uL (ref 150–400)
RBC: 5.04 MIL/uL (ref 4.22–5.81)
RDW: 12.5 % (ref 11.5–15.5)
WBC: 7.3 10*3/uL (ref 4.0–10.5)
nRBC: 0 % (ref 0.0–0.2)

## 2021-09-03 LAB — LIPID PANEL
Cholesterol: 121 mg/dL (ref 0–200)
HDL: 31 mg/dL — ABNORMAL LOW (ref >40)
LDL Cholesterol: 67 mg/dL (ref 0–99)
Total CHOL/HDL Ratio: 3.9 RATIO
Triglycerides: 114 mg/dL (ref <150)
VLDL: 23 mg/dL (ref 0–40)

## 2021-09-03 LAB — BASIC METABOLIC PANEL
Anion gap: 9 (ref 5–15)
BUN: 16 mg/dL (ref 6–20)
CO2: 22 mmol/L (ref 22–32)
Calcium: 9.3 mg/dL (ref 8.9–10.3)
Chloride: 104 mmol/L (ref 98–111)
Creatinine, Ser: 1.05 mg/dL (ref 0.61–1.24)
GFR, Estimated: >60 mL/min (ref >60)
Glucose, Bld: 103 mg/dL — ABNORMAL HIGH (ref 70–99)
Potassium: 4 mmol/L (ref 3.5–5.1)
Sodium: 135 mmol/L (ref 135–145)

## 2021-09-03 LAB — HEPATIC FUNCTION PANEL
ALT: 152 U/L — ABNORMAL HIGH (ref 0–44)
AST: 80 U/L — ABNORMAL HIGH (ref 15–41)
Albumin: 4.3 g/dL (ref 3.5–5.0)
Alkaline Phosphatase: 64 U/L (ref 38–126)
Bilirubin, Direct: 0.1 mg/dL (ref 0.0–0.2)
Indirect Bilirubin: 0.5 mg/dL (ref 0.3–0.9)
Total Bilirubin: 0.6 mg/dL (ref 0.3–1.2)
Total Protein: 7.7 g/dL (ref 6.5–8.1)

## 2021-09-08 ENCOUNTER — Telehealth: Payer: Self-pay | Admitting: *Deleted

## 2021-09-08 NOTE — Telephone Encounter (Signed)
CTO  scheduled at [facility] for:  Wednesday September 10, 2021 8:30 AM [facility] Main Entrance A [facility]) at: 6:30 AM   No solid food after midnight prior to cath, clear liquids until 5 AM day of procedure.  Usual morning medications can be taken pre-cath with sips of water including: -aspirin 81 mg -Plavix 75 mg    Confirmed patient has responsible adult to drive home post procedure and be with patient first 24 hours after arriving home.  [facility] does allow one visitor to accompany you and wait in the hospital waiting room while you are there for your procedure. You and your visitor will be asked to wear a mask once you enter the hospital.   Patient reports does not currently have any new symptoms concerning for COVID-19 and no household members with COVID-19 like illness.       Reviewed procedure/mask/visitor instructions with patient.

## 2021-09-10 ENCOUNTER — Encounter (HOSPITAL_COMMUNITY): Payer: Self-pay | Admitting: Interventional Cardiology

## 2021-09-10 ENCOUNTER — Other Ambulatory Visit: Payer: Self-pay

## 2021-09-10 ENCOUNTER — Ambulatory Visit (HOSPITAL_COMMUNITY)
Admission: RE | Admit: 2021-09-10 | Discharge: 2021-09-11 | Disposition: A | Discharge status: Home / Self Care | Payer: BC Managed Care – PPO | Attending: Interventional Cardiology | Admitting: Interventional Cardiology

## 2021-09-10 ENCOUNTER — Encounter (HOSPITAL_COMMUNITY)
Admission: RE | Disposition: A | Discharge status: Home / Self Care | Payer: BC Managed Care – PPO | Source: Home / Self Care | Attending: Interventional Cardiology

## 2021-09-10 DIAGNOSIS — Z955 Presence of coronary angioplasty implant and graft: Secondary | ICD-10-CM

## 2021-09-10 DIAGNOSIS — I10 Essential (primary) hypertension: Secondary | ICD-10-CM | POA: Insufficient documentation

## 2021-09-10 DIAGNOSIS — G4733 Obstructive sleep apnea (adult) (pediatric): Secondary | ICD-10-CM | POA: Insufficient documentation

## 2021-09-10 DIAGNOSIS — I25119 Atherosclerotic heart disease of native coronary artery with unspecified angina pectoris: Secondary | ICD-10-CM | POA: Diagnosis not present

## 2021-09-10 DIAGNOSIS — E669 Obesity, unspecified: Secondary | ICD-10-CM | POA: Diagnosis not present

## 2021-09-10 DIAGNOSIS — I2582 Chronic total occlusion of coronary artery: Secondary | ICD-10-CM | POA: Diagnosis not present

## 2021-09-10 DIAGNOSIS — I2510 Atherosclerotic heart disease of native coronary artery without angina pectoris: Secondary | ICD-10-CM | POA: Diagnosis present

## 2021-09-10 DIAGNOSIS — E785 Hyperlipidemia, unspecified: Secondary | ICD-10-CM | POA: Insufficient documentation

## 2021-09-10 DIAGNOSIS — Z6839 Body mass index (BMI) 39.0-39.9, adult: Secondary | ICD-10-CM | POA: Insufficient documentation

## 2021-09-10 DIAGNOSIS — E668 Other obesity: Secondary | ICD-10-CM | POA: Diagnosis present

## 2021-09-10 DIAGNOSIS — I209 Angina pectoris, unspecified: Secondary | ICD-10-CM | POA: Diagnosis present

## 2021-09-10 HISTORY — PX: INTRAVASCULAR ULTRASOUND/IVUS: CATH118244

## 2021-09-10 HISTORY — PX: CORONARY CTO INTERVENTION: CATH118236

## 2021-09-10 LAB — POCT ACTIVATED CLOTTING TIME
Activated Clotting Time: 318 seconds
Activated Clotting Time: 248 seconds
Activated Clotting Time: 283 seconds
Activated Clotting Time: 353 seconds
Activated Clotting Time: 260 seconds
Activated Clotting Time: 179 seconds
Activated Clotting Time: 190 seconds

## 2021-09-10 SURGERY — CORONARY CTO INTERVENTION
Anesthesia: LOCAL

## 2021-09-10 MED ORDER — MIDAZOLAM HCL 2 MG/2ML IJ SOLN
INTRAMUSCULAR | Status: AC
  Filled 2021-09-10: qty 2

## 2021-09-10 MED ORDER — HEPARIN SODIUM (PORCINE) 1000 UNIT/ML IJ SOLN
INTRAMUSCULAR | Status: AC
  Filled 2021-09-10: qty 1

## 2021-09-10 MED ORDER — CLOPIDOGREL BISULFATE 75 MG PO TABS: 75.0000 mg | ORAL_TABLET | ORAL | Status: DC

## 2021-09-10 MED ORDER — NITROGLYCERIN 1 MG/10 ML FOR IR/CATH LAB
INTRA_ARTERIAL | Status: AC
  Filled 2021-09-10: qty 10

## 2021-09-10 MED ORDER — SODIUM CHLORIDE 0.9% FLUSH: 3.0000 mL | INTRAVENOUS | Status: DC | PRN

## 2021-09-10 MED ORDER — SODIUM CHLORIDE 0.9 % WEIGHT BASED INFUSION
3.0000 mL/kg/h | INTRAVENOUS | Status: DC
  Administered 2021-09-10: 3 mL/kg/h via INTRAVENOUS

## 2021-09-10 MED ORDER — ROSUVASTATIN CALCIUM 20 MG PO TABS
20.0000 mg | ORAL_TABLET | Freq: Every day | ORAL | Status: DC
  Administered 2021-09-11: 20 mg via ORAL
  Filled 2021-09-10: qty 1

## 2021-09-10 MED ORDER — ACETAMINOPHEN 325 MG PO TABS
650.0000 mg | ORAL_TABLET | ORAL | Status: DC | PRN
  Administered 2021-09-10: 650 mg via ORAL
  Filled 2021-09-10: qty 2

## 2021-09-10 MED ORDER — CLOPIDOGREL BISULFATE 75 MG PO TABS: 75.0000 mg | ORAL_TABLET | Freq: Every day | ORAL | Status: DC

## 2021-09-10 MED ORDER — HEPARIN (PORCINE) IN NACL 1000-0.9 UT/500ML-% IV SOLN
INTRAVENOUS | Status: AC
  Filled 2021-09-10: qty 500

## 2021-09-10 MED ORDER — METOPROLOL TARTRATE 50 MG PO TABS
50.0000 mg | ORAL_TABLET | Freq: Two times a day (BID) | ORAL | Status: DC
  Administered 2021-09-10 – 2021-09-11 (×2): 50 mg via ORAL
  Filled 2021-09-10: qty 1
  Filled 2021-09-10: qty 2

## 2021-09-10 MED ORDER — LIDOCAINE HCL (PF) 1 % IJ SOLN
INTRAMUSCULAR | Status: DC | PRN
  Administered 2021-09-10 (×2): 20 mL

## 2021-09-10 MED ORDER — NITROGLYCERIN 1 MG/10 ML FOR IR/CATH LAB
INTRA_ARTERIAL | Status: DC | PRN
  Administered 2021-09-10 (×2): 200 ug via INTRACORONARY

## 2021-09-10 MED ORDER — LIDOCAINE HCL (PF) 1 % IJ SOLN
INTRAMUSCULAR | Status: AC
  Filled 2021-09-10: qty 30

## 2021-09-10 MED ORDER — IOHEXOL 350 MG/ML SOLN
INTRAVENOUS | Status: DC | PRN
  Administered 2021-09-10: 155 mL via INTRA_ARTERIAL

## 2021-09-10 MED ORDER — LISINOPRIL 5 MG PO TABS
5.0000 mg | ORAL_TABLET | Freq: Every day | ORAL | Status: DC
  Administered 2021-09-11: 5 mg via ORAL
  Filled 2021-09-10: qty 1

## 2021-09-10 MED ORDER — ONDANSETRON HCL 4 MG/2ML IJ SOLN: 4.0000 mg | Freq: Four times a day (QID) | INTRAMUSCULAR | Status: DC | PRN

## 2021-09-10 MED ORDER — SODIUM CHLORIDE 0.9% FLUSH
3.0000 mL | Freq: Two times a day (BID) | INTRAVENOUS | Status: DC
  Administered 2021-09-10: 3 mL via INTRAVENOUS

## 2021-09-10 MED ORDER — FENTANYL CITRATE (PF) 100 MCG/2ML IJ SOLN
INTRAMUSCULAR | Status: DC | PRN
  Administered 2021-09-10 (×6): 25 ug via INTRAVENOUS

## 2021-09-10 MED ORDER — EZETIMIBE 10 MG PO TABS
10.0000 mg | ORAL_TABLET | Freq: Every day | ORAL | Status: DC
  Administered 2021-09-11: 10 mg via ORAL
  Filled 2021-09-10: qty 1

## 2021-09-10 MED ORDER — SODIUM CHLORIDE 0.9 % IV SOLN: 250.0000 mL | INTRAVENOUS | Status: DC | PRN

## 2021-09-10 MED ORDER — AMLODIPINE BESYLATE 5 MG PO TABS
5.0000 mg | ORAL_TABLET | Freq: Every day | ORAL | Status: DC
  Administered 2021-09-11: 5 mg via ORAL
  Filled 2021-09-10: qty 1

## 2021-09-10 MED ORDER — ASPIRIN 81 MG PO CHEW
81.0000 mg | CHEWABLE_TABLET | Freq: Every day | ORAL | Status: DC
  Administered 2021-09-11: 81 mg via ORAL
  Filled 2021-09-10: qty 1

## 2021-09-10 MED ORDER — FENTANYL CITRATE (PF) 100 MCG/2ML IJ SOLN
INTRAMUSCULAR | Status: AC
  Filled 2021-09-10: qty 2

## 2021-09-10 MED ORDER — NITROGLYCERIN 0.4 MG SL SUBL: 0.4000 mg | SUBLINGUAL_TABLET | SUBLINGUAL | Status: DC | PRN

## 2021-09-10 MED ORDER — ASPIRIN 81 MG PO CHEW: 81.0000 mg | CHEWABLE_TABLET | Freq: Every day | ORAL | Status: DC

## 2021-09-10 MED ORDER — ASPIRIN 81 MG PO CHEW: 81.0000 mg | CHEWABLE_TABLET | ORAL | Status: DC

## 2021-09-10 MED ORDER — HEPARIN SODIUM (PORCINE) 1000 UNIT/ML IJ SOLN
INTRAMUSCULAR | Status: DC | PRN
  Administered 2021-09-10: 10000 [IU] via INTRAVENOUS
  Administered 2021-09-10: 2000 [IU] via INTRAVENOUS
  Administered 2021-09-10: 4000 [IU] via INTRAVENOUS
  Administered 2021-09-10: 2000 [IU] via INTRAVENOUS

## 2021-09-10 MED ORDER — PANTOPRAZOLE SODIUM 40 MG PO TBEC
40.0000 mg | DELAYED_RELEASE_TABLET | Freq: Every day | ORAL | Status: DC
  Administered 2021-09-11: 40 mg via ORAL
  Filled 2021-09-10: qty 1

## 2021-09-10 MED ORDER — MIDAZOLAM HCL 2 MG/2ML IJ SOLN
INTRAMUSCULAR | Status: DC | PRN
  Administered 2021-09-10 (×5): 1 mg via INTRAVENOUS
  Administered 2021-09-10: 2 mg via INTRAVENOUS

## 2021-09-10 MED ORDER — CLOPIDOGREL BISULFATE 75 MG PO TABS
75.0000 mg | ORAL_TABLET | Freq: Every day | ORAL | Status: DC
  Administered 2021-09-11: 75 mg via ORAL
  Filled 2021-09-10: qty 1

## 2021-09-10 MED ORDER — HYDRALAZINE HCL 20 MG/ML IJ SOLN: 10.0000 mg | INTRAMUSCULAR | Status: AC | PRN

## 2021-09-10 MED ORDER — SODIUM CHLORIDE 0.9 % IV SOLN: INTRAVENOUS | Status: AC

## 2021-09-10 MED ORDER — SODIUM CHLORIDE 0.9 % WEIGHT BASED INFUSION: 1.0000 mL/kg/h | INTRAVENOUS | Status: DC

## 2021-09-10 MED ORDER — HEPARIN (PORCINE) IN NACL 1000-0.9 UT/500ML-% IV SOLN
INTRAVENOUS | Status: DC | PRN
  Administered 2021-09-10 (×4): 500 mL

## 2021-09-10 MED ORDER — LABETALOL HCL 5 MG/ML IV SOLN: 10.0000 mg | INTRAVENOUS | Status: AC | PRN

## 2021-09-10 SURGICAL SUPPLY — 39 items
BALLN EMERGE MR 2.5X15 (BALLOONS) ×2
BALLN SAPPHIRE 1.5X20 (BALLOONS) ×2
BALLN SAPPHIRE ~~LOC~~ 3.0X15 (BALLOONS) ×1 IMPLANT
BALLN WOLVERINE 2.50X15 (BALLOONS) ×2
BALLN ~~LOC~~ EMERGE MR 3.25X8 (BALLOONS) ×2
BALLOON EMERGE MR 2.5X15 (BALLOONS) IMPLANT
BALLOON SAPPHIRE 1.5X20 (BALLOONS) IMPLANT
BALLOON WOLVERINE 2.50X15 (BALLOONS) IMPLANT
BALLOON ~~LOC~~ EMERGE MR 3.25X8 (BALLOONS) IMPLANT
CATH 7FR TRAPLINER (CATHETERS) ×1 IMPLANT
CATH INFINITI 5FR JL4 (CATHETERS) ×1 IMPLANT
CATH MACH1 8FR AL.75  90CM (CATHETERS) ×1
CATH MACH1 8FR AL.75 90CM (CATHETERS) IMPLANT
CATH MAMBA 135 (CATHETERS) ×1 IMPLANT
CATH OPTICROSS HD (CATHETERS) ×1 IMPLANT
ELECT DEFIB PAD ADLT CADENCE (PAD) ×1 IMPLANT
GUIDEWIRE JUDO 3 190 (WIRE) ×1 IMPLANT
KIT ENCORE 26 ADVANTAGE (KITS) ×1 IMPLANT
KIT ESSENTIALS PG (KITS) ×1 IMPLANT
KIT HEART LEFT (KITS) ×3 IMPLANT
KIT HEMO VALVE WATCHDOG (MISCELLANEOUS) ×1 IMPLANT
KIT MICROPUNCTURE NIT STIFF (SHEATH) ×1 IMPLANT
MAT PREVALON FULL STRYKER (MISCELLANEOUS) ×1 IMPLANT
PACK CARDIAC CATHETERIZATION (CUSTOM PROCEDURE TRAY) ×2 IMPLANT
SHEATH BRITE TIP 8FR 35CM (SHEATH) ×1 IMPLANT
SHEATH PINNACLE 5F 10CM (SHEATH) ×1 IMPLANT
SHEATH PROBE COVER 6X72 (BAG) ×1 IMPLANT
SLED PULL BACK IVUS (MISCELLANEOUS) ×1 IMPLANT
STENT SYNERGY XD 2.25X28 (Permanent Stent) IMPLANT
SYNERGY XD 2.25X28 (Permanent Stent) ×2 IMPLANT
TRANSDUCER W/STOPCOCK (MISCELLANEOUS) ×3 IMPLANT
TUBING ART PRESS 72  MALE/FEM (TUBING) ×1
TUBING ART PRESS 72 MALE/FEM (TUBING) IMPLANT
TUBING CIL FLEX 10 FLL-RA (TUBING) ×3 IMPLANT
WIRE ASAHI CONFIANZPRO12 300CM (WIRE) ×1 IMPLANT
WIRE ASAHI GRAND SLAM 180CM (WIRE) ×1 IMPLANT
WIRE ASAHI PROWATER 180CM (WIRE) ×1 IMPLANT
WIRE EMERALD 3MM-J .035X150CM (WIRE) ×1 IMPLANT
WIRE FIGHTER CROSSING 190CM (WIRE) ×1 IMPLANT

## 2021-09-10 NOTE — Interval H&P Note (Signed)
History and Physical Interval Note:  09/10/2021 8:10 AM  Alex Barnes  has presented today for surgery, with the diagnosis of CTO.  The various methods of treatment have been discussed with the patient and family. After consideration of risks, benefits and other options for treatment, the patient has consented to  Procedure(s): CORONARY CTO INTERVENTION (N/A) as a surgical intervention.  The patient's history has been reviewed, patient examined, no change in status, stable for surgery.  I have reviewed the patient's chart and labs.  Questions were answered to the patient's satisfaction.   Cath Lab Visit (complete for each Cath Lab visit)  Clinical Evaluation Leading to the Procedure:   ACS: No.  Non-ACS:    Anginal Classification: CCS III  Anti-ischemic medical therapy: Maximal Therapy (2 or more classes of medications)  Non-Invasive Test Results: No non-invasive testing performed  Prior CABG: No previous CABG        Theron Arista [facility] 09/10/2021 8:10 AM

## 2021-09-10 NOTE — Progress Notes (Signed)
SITE AREA: right groin/femoral  SITE PRIOR TO REMOVAL:  LEVEL 0  PRESSURE APPLIED FOR: approximately 22 minutes  MANUAL: yes  PATIENT STATUS DURING PULL: stable  POST PULL SITE:  LEVEL 0  POST PULL INSTRUCTIONS GIVEN: yes  POST PULL PULSES PRESENT: right pedal pulse palpable at +2  DRESSING APPLIED: gauze with tegaderm   BEDREST BEGINS: after next sheath removal  COMMENTS:

## 2021-09-10 NOTE — Progress Notes (Signed)
SITE AREA: left groin/femoral  SITE PRIOR TO REMOVAL:  LEVEL 0  PRESSURE APPLIED FOR: approximately 20 minutes by [employer], RN  MANUAL: yes  PATIENT STATUS DURING PULL: stable  POST PULL SITE:  LEVEL 0  POST PULL INSTRUCTIONS GIVEN: yes  POST PULL PULSES PRESENT: bilateral pedal pulses at +2  DRESSING APPLIED: gauze with tegaderm  BEDREST BEGINS @ 1450  COMMENTS:

## 2021-09-11 ENCOUNTER — Encounter (HOSPITAL_COMMUNITY): Payer: Self-pay | Admitting: Cardiology

## 2021-09-11 DIAGNOSIS — G4733 Obstructive sleep apnea (adult) (pediatric): Secondary | ICD-10-CM | POA: Diagnosis not present

## 2021-09-11 DIAGNOSIS — I209 Angina pectoris, unspecified: Secondary | ICD-10-CM

## 2021-09-11 DIAGNOSIS — Z6839 Body mass index (BMI) 39.0-39.9, adult: Secondary | ICD-10-CM | POA: Diagnosis not present

## 2021-09-11 DIAGNOSIS — I10 Essential (primary) hypertension: Secondary | ICD-10-CM | POA: Diagnosis not present

## 2021-09-11 DIAGNOSIS — I25119 Atherosclerotic heart disease of native coronary artery with unspecified angina pectoris: Secondary | ICD-10-CM | POA: Diagnosis not present

## 2021-09-11 DIAGNOSIS — E669 Obesity, unspecified: Secondary | ICD-10-CM | POA: Diagnosis not present

## 2021-09-11 DIAGNOSIS — I2510 Atherosclerotic heart disease of native coronary artery without angina pectoris: Secondary | ICD-10-CM | POA: Diagnosis not present

## 2021-09-11 DIAGNOSIS — E785 Hyperlipidemia, unspecified: Secondary | ICD-10-CM | POA: Diagnosis not present

## 2021-09-11 LAB — CBC
HCT: 42.2 % (ref 39.0–52.0)
Hemoglobin: 14.2 g/dL (ref 13.0–17.0)
MCH: 30.9 pg (ref 26.0–34.0)
MCHC: 33.6 g/dL (ref 30.0–36.0)
MCV: 91.9 fL (ref 80.0–100.0)
Platelets: 335 10*3/uL (ref 150–400)
RBC: 4.59 MIL/uL (ref 4.22–5.81)
RDW: 12.9 % (ref 11.5–15.5)
WBC: 8.4 10*3/uL (ref 4.0–10.5)
nRBC: 0 % (ref 0.0–0.2)

## 2021-09-11 LAB — BASIC METABOLIC PANEL
Anion gap: 5 (ref 5–15)
BUN: 11 mg/dL (ref 6–20)
CO2: 26 mmol/L (ref 22–32)
Calcium: 8.6 mg/dL — ABNORMAL LOW (ref 8.9–10.3)
Chloride: 104 mmol/L (ref 98–111)
Creatinine, Ser: 1.01 mg/dL (ref 0.61–1.24)
GFR, Estimated: >60 mL/min (ref >60)
Glucose, Bld: 117 mg/dL — ABNORMAL HIGH (ref 70–99)
Potassium: 3.9 mmol/L (ref 3.5–5.1)
Sodium: 135 mmol/L (ref 135–145)

## 2021-09-11 NOTE — Discharge Summary (Addendum)
Discharge Summary    Patient ID: Alex Barnes MRN: [id number]; DOB: [birth year]  Admit date: 09/10/2021 Discharge date: 09/11/2021  PCP:  Richardean Chimeraaniel, Terry G, MD   [facility] Prov[facility] Cardiologist:  Dina RichBranch, Jonathan, MD      Discharge Diagnoses    Principal Problem:   CAD (coronary artery disease) Active Problems:   Hyperlipidemia   Moderate obesity   Angina pectoris (HCC)   OSA (obstructive sleep apnea)    Diagnostic Studies/Procedures    Cath: 09/10/21    Acute Mrg lesion is 95% stenosed.   Dist RCA lesion is 100% stenosed.   Non-stenotic 2nd Mrg lesion was previously treated.   A drug-eluting stent was successfully placed using a SYNERGY XD 2.25X28.   Post intervention, there is a 0% residual stenosis.   Successful CTO PCI of the distal RCA with IVUS guidance and DES x 1 Unsuccessful PCI of the RV marginal branch due to acute angled take off and inability to cross with a wire.    Plan: DAPT for one year. Anticipate DC tomorrow.   Diagnostic Dominance: Right Intervention   _____________   History of Present Illness     Alex Barnes is a 50 y.o. male with past medical history of CAD with known CTO of RCA, hypertension, hyperlipidemia who was referred to Dr. SwazilandJordan for consideration of CTO PCI.  Underwent cardiac catheterization 2016 with totally occluded RCA PL branch as well as significant circumflex and OM disease.  Underwent DES to left circumflex and OM 2 with PL occlusion managed medically.  Follow-up echocardiogram showed EF of 55 to 60% with no motion abnormalities.  He was treated for post MI pericarditis with colchicine, aspirin and NSAIDs.  On follow-up he complained of persistent chest pain despite medical therapy.  Had a Myoview in 2018 which showed inferior infarct with peri-infarct ischemia with normal EF.  [facility] showing occlusion of RCA without new disease.  He was seen in the office on 08/28/21 with Dr.  SwazilandJordan with continued complaints of shortness of breath, chest pain and diaphoresis.  He was treated with isosorbide but developed headache and unable to tolerate.  He was also on amlodipine and metoprolol.  Given ongoing symptoms it was recommended that he undergo a repeat cath with plans for CTO PCI if deemed appropriate candidate.  Underwent cardiac catheterization 08/29/2021 which showed single-vessel occlusive CAD.  Prior stent in the circumflex was patent.  CTO of PL branch of RCA with collaterals from circumflex.  He was started on Plavix and scheduled for CTO PCI cath.   Hospital Course     Underwent cardiac catheterization with Dr. Michelle NasutiJordan/Dr. Varanasi on 11/9 with successful CTO PCI of distal RCA using IVUS guidance and DES x1.  Attempts made at PCI of the RV marginal branch but unable to cross wire.  He is continued on DAPT with aspirin/Plavix with plans for 1 year.  No complications noted overnight.  He was able to ambulate with cardiac rehab without recurrent chest pain.  Home medications continued without significant change.  Of note he had issues with elevated LFTs on higher doses of Crestor.  Currently tolerating Crestor 20 mg daily along with Zetia.  He has been referred to the lipid clinic with upcoming appointment.  General: Well developed, well nourished, male appearing in no acute distress. Head: Normocephalic, atraumatic.  Neck: Supple without bruits, JVD. Lungs:  Resp regular and unlabored, CTA. Heart: RRR, S1, S2, no S3, S4, or  murmur; no rub. Abdomen: Soft, non-tender, non-distended with normoactive bowel sounds. No hepatomegaly. No rebound/guarding. No obvious abdominal masses. Extremities: No clubbing, cyanosis, edema. Distal pedal pulses are 2+ bilaterally.  Bilateral femoral cath site stable with mild bruising but no hematoma Neuro: Alert and oriented X 3. Moves all extremities spontaneously. Psych: Normal affect.  Patient was seen by Dr. Excell Seltzer and deemed stable for  discharge home. Follow up in the office has been arranged. Educated by PharmD prior to discharge.   Did the patient have an acute coronary syndrome (MI, NSTEMI, STEMI, etc) this admission?:  No                               Did the patient have a percutaneous coronary intervention (stent / angioplasty)?:  Yes.     Cath/PCI Registry Performance & Quality Measures: Aspirin prescribed? - Yes ADP Receptor Inhibitor (Plavix/Clopidogrel, Brilinta/Ticagrelor or Effient/Prasugrel) prescribed (includes medically managed patients)? - Yes High Intensity Statin (Lipitor 40-80mg  or Crestor 20-40mg ) prescribed? - Yes For EF <40%, was ACEI/ARB prescribed? - Not Applicable (EF >/= 40%) For EF <40%, Aldosterone Antagonist (Spironolactone or Eplerenone) prescribed? - Not Applicable (EF >/= 40%) Cardiac Rehab Phase II ordered? - Yes  The patient will be scheduled for a TOC follow up appointment in 10-14 days.  A message has been sent to the [facility] and Scheduling Pool at the office where the patient should be seen for follow up.  _____________  Discharge Vitals Blood pressure 121/67, pulse (!) 57, temperature 97.7 F (36.5 C), temperature source Oral, resp. rate 19, height 5\' 10"  (1.778 m), weight 122.5 kg, SpO2 95 %.  Filed Weights   09/10/21 0658  Weight: 122.5 kg    Labs & Radiologic Studies    CBC Recent Labs    09/11/21 0213  WBC 8.4  HGB 14.2  HCT 42.2  MCV 91.9  PLT 335   Basic Metabolic Panel Recent Labs    13/10/22 0213  NA 135  K 3.9  CL 104  CO2 26  GLUCOSE 117*  BUN 11  CREATININE 1.01  CALCIUM 8.6*   Liver Function Tests No results for input(s): AST, ALT, ALKPHOS, BILITOT, PROT, ALBUMIN in the last 72 hours. No results for input(s): LIPASE, AMYLASE in the last 72 hours. High Sensitivity Troponin:   No results for input(s): TROPONINIHS in the last 720 hours.  BNP Invalid input(s): POCBNP D-Dimer No results for input(s): DDIMER in the last 72 hours. Hemoglobin  A1C No results for input(s): HGBA1C in the last 72 hours. Fasting Lipid Panel No results for input(s): CHOL, HDL, LDLCALC, TRIG, CHOLHDL, LDLDIRECT in the last 72 hours. Thyroid Function Tests No results for input(s): TSH, T4TOTAL, T3FREE, THYROIDAB in the last 72 hours.  Invalid input(s): FREET3 _____________  CARDIAC CATHETERIZATION  Result Date: 09/10/2021   Acute Mrg lesion is 95% stenosed.   Dist RCA lesion is 100% stenosed.   Non-stenotic 2nd Mrg lesion was previously treated.   A drug-eluting stent was successfully placed using a SYNERGY XD 2.25X28.   Post intervention, there is a 0% residual stenosis. Successful CTO PCI of the distal RCA with IVUS guidance and DES x 1 Unsuccessful PCI of the RV marginal branch due to acute angled take off and inability to cross with a wire. Plan: DAPT for one year. Anticipate DC tomorrow.   CARDIAC CATHETERIZATION  Result Date: 08/29/2021   RPAV lesion is 100% stenosed.   Acute Mrg  lesion is 95% stenosed.   Previously placed 2nd Mrg stent (unknown type) is  widely patent.   The left ventricular systolic function is normal.   LV end diastolic pressure is normal.   The left ventricular ejection fraction is 55-65% by visual estimate. Single vessel occlusive CAD. Prior stent in the LCx is patent. There is a CTO of the PL branch of the RCA with epicardial collaterals from the LCx. There is also critical stenosis in the PDA which has a high take off from the mid RCA and is supplied by epicardial collaterals from the LAD. Normal LV function Normal LVEDP Plan: will arrange for staged CTO PCI of the RCA lesions. Initiate Plavix therapy.   Disposition   Pt is being discharged home today in good condition.  Follow-up Plans & Appointments     Follow-up Information     [facility] Follow up on 09/30/2021.   Specialty: Cardiology Why: at [facility] for your lipid clinic appt Contact information: [address] [place]  [place] [phone]        Martinique, Peter M, MD Follow up on 10/01/2021.   Specialty: Cardiology Why: at 2:30pm for your follow up appt Contact information: [place] [address] [place] [address] [phone]                Discharge Instructions     Amb Referral to Cardiac Rehabilitation   Complete by: As directed    Diagnosis: Coronary Stents   After initial evaluation and assessments completed: Virtual Based Care may be provided alone or in conjunction with Phase 2 Cardiac Rehab based on patient barriers.: Yes   Call MD for:  difficulty breathing, headache or visual disturbances   Complete by: As directed    Call MD for:  persistant dizziness or light-headedness   Complete by: As directed    Call MD for:  redness, tenderness, or signs of infection (pain, swelling, redness, odor or green/yellow discharge around incision site)   Complete by: As directed    Diet - low sodium heart healthy   Complete by: As directed    Discharge instructions   Complete by: As directed    Groin Site Care Refer to this sheet in the next few weeks. These instructions provide you with information on caring for yourself after your procedure. Your caregiver may also give you more specific instructions. Your treatment has been planned according to current medical practices, but problems sometimes occur. Call your caregiver if you have any problems or questions after your procedure. HOME CARE INSTRUCTIONS You may shower 24 hours after the procedure. Remove the bandage (dressing) and gently wash the site with plain soap and water. Gently pat the site dry.  Do not apply powder or lotion to the site.  Do not sit in a bathtub, swimming pool, or whirlpool for 5 to 7 days.  No bending, squatting, or lifting anything over 10 pounds (4.5 kg) as directed by your caregiver.  Inspect the site at least twice daily.  Do not drive home if you are discharged the same day of the procedure. Have someone  else drive you.  You may drive 24 hours after the procedure unless otherwise instructed by your caregiver.  What to expect: Any bruising will usually fade within 1 to 2 weeks.  Blood that collects in the tissue (hematoma) may be painful to the touch. It should usually decrease in size and tenderness within 1 to 2 weeks.  SEEK IMMEDIATE  MEDICAL CARE IF: You have unusual pain at the groin site or down the affected leg.  You have redness, warmth, swelling, or pain at the groin site.  You have drainage (other than a small amount of blood on the dressing).  You have chills.  You have a fever or persistent symptoms for more than 72 hours.  You have a fever and your symptoms suddenly get worse.  Your leg becomes pale, cool, tingly, or numb.  You have heavy bleeding from the site. Hold pressure on the site. .  Patients taking blood thinners should generally stay away from medicines like ibuprofen, Advil, Motrin, naproxen, and Aleve due to risk of stomach bleeding. You may take Tylenol as directed or talk to your primary doctor about alternatives.   PLEASE ENSURE THAT YOU DO NOT RUN OUT OF YOUR PLAVIX. This medication is very important to remain on for at least one year. IF you have issues obtaining this medication due to cost please CALL the office 3-5 business days prior to running out in order to prevent missing doses of this medication.   Increase activity slowly   Complete by: As directed        Discharge Medications   Allergies as of 09/11/2021   No Known Allergies      Medication List     TAKE these medications    amLODipine 5 MG tablet Commonly known as: NORVASC Take 1 tablet (5 mg total) by mouth daily.   aspirin 81 MG chewable tablet Chew 1 tablet (81 mg total) by mouth daily.   clopidogrel 75 MG tablet Commonly known as: Plavix Take 1 tablet (75 mg total) by mouth daily.   ezetimibe 10 MG tablet Commonly known as: ZETIA Take 10 mg by mouth daily.   ibuprofen 200  MG tablet Commonly known as: ADVIL Take 400-800 mg by mouth every 6 (six) hours as needed for headache or moderate pain.   lisinopril 2.5 MG tablet Commonly known as: ZESTRIL TAKE 2 TABLETS BY MOUTH EVERY DAY   metoprolol tartrate 50 MG tablet Commonly known as: LOPRESSOR TAKE 1 TABLET BY MOUTH TWICE A DAY   nitroGLYCERIN 0.4 MG SL tablet Commonly known as: NITROSTAT Place 1 tablet (0.4 mg total) under the tongue every 5 (five) minutes as needed for chest pain.   pantoprazole 40 MG tablet Commonly known as: PROTONIX Take 40 mg by mouth daily.   rosuvastatin 40 MG tablet Commonly known as: CRESTOR Take 0.5 tablets (20 mg total) by mouth daily.   testosterone cypionate 200 MG/ML injection Commonly known as: DEPOTESTOSTERONE CYPIONATE Inject 200 mg into the muscle every 14 (fourteen) days.   VITAMIN C PO Take 1 tablet by mouth daily.       Outstanding Labs/Studies   Lipid clinic visit  Duration of Discharge Encounter   Greater than 30 minutes including physician time.  Signed, Reino Bellis, NP 09/11/2021, 9:43 AM  Patient seen, examined. Available data reviewed. Agree with findings, assessment, and plan as outlined by Reino Bellis, NP.  On exam the patient is alert and oriented in no distress.  HEENT is normal, lungs are clear, heart is regular rate and rhythm no murmur gallop, abdomen soft nontender, bilateral groin sites are clear with mild ecchymosis on the right and no ecchymosis on the left.  There is no hematoma or tenderness on either side.  No lower extremity edema.  The patient is undergone successful CTO-PCI of the distal right coronary artery.  He understands the importance of dual  antiplatelet therapy with aspirin and clopidogrel.  Follow-up arranged as above.  He is medically stable for discharge today.  Sherren Mocha, M.D. 09/11/2021 11:05 AM

## 2021-09-11 NOTE — Progress Notes (Signed)
Discharge instructions (including medications) discussed with and copy provided to patient/caregiver 

## 2021-09-11 NOTE — Plan of Care (Signed)
  Problem: Education: Goal: Knowledge of General Education information will improve Description: Including pain rating scale, medication(s)/side effects and non-pharmacologic comfort measures Outcome: Adequate for Discharge   

## 2021-09-11 NOTE — Progress Notes (Signed)
CARDIAC REHAB PHASE I   Offered to walk with pt. Pt declines at this time, has been moving around independently without difficulty. Pt educated on importance of ASA and Plavix. Pt given stent card and heart healthy diet. Reviewed site care, restrictions, and exercise guidelines. Will refer to CRP II [place] to meet the requirements as pt does not want to attend.   [id number] Reynold Bowen, RN BSN 09/11/2021 8:46 AM

## 2021-09-19 ENCOUNTER — Other Ambulatory Visit: Payer: Self-pay

## 2021-09-19 DIAGNOSIS — R7401 Elevation of levels of liver transaminase levels: Secondary | ICD-10-CM

## 2021-09-19 DIAGNOSIS — E782 Mixed hyperlipidemia: Secondary | ICD-10-CM

## 2021-09-24 NOTE — Progress Notes (Signed)
Interventional cardiology note:   Date:  10/01/2021   ID:  Alex Barnes, DOB 01-04-1971, MRN [id number]  PCP:  Richardean Chimera, MD  Cardiologist:   Conner Neiss [place]az[place], MD   Chief Complaint  Patient presents with   Coronary Artery Disease       History of Present Illness: Alex Barnes is a 50 y.o. male who is seen for follow up post PCI.  2016 Cardiac catheterization with totally occluded RCA PL branch as well as significant LCx and OM disease.  Status post DES to LCx and OM 2, PL occlusion managed medically. Echocardiogram 2016 LVEF 55 to 60%, no WMA's. Treated for post MI pericarditis with colchicine aspirin and NSAIDs. On follow up he had persistent chest pain despite medical therapy. Last Myoview in 2018 showed inferior infarct with peri infarct ischemia and normal EF at good exercise tolerance. Echo in July showed Normal LV function. He apparently had cardiac cath in May 2021 at [facility] showing occlusion of RCA without any new disease.   He reports symptoms of left precordial chest pain with exertion or stress. Sometimes radiates to his left arm and is associated with SOB and feeling clammy. Typically relieved with rest but may take 30 minutes to 4 hours to go away. Does get relief with Ntg but this gives him a severe HA so he doesn't like to take it. Has tried isosorbide with similar HA. Now on amlodipine and metoprolol. Feels his symptoms are getting worse and is negatively impacting his quality of life. For this reason he underwent CTO PCI of the distal RCA on 09/10/21. This was successful. Unable to pass a wire into the RV marginal branch.   On follow up today he notes that his anginal symptoms have resolved. He is able to do things now that he couldn't before and he feels it is life changing. He saw Pharm D yesterday and is going to get started on a PCSK 9 inhibitor. No groin complications.  Past Medical History:  Diagnosis Date   CAD (coronary artery disease)    cath  9/26 & 07/31/2015 100% total occlusion of post atrio branch of RCA, LV supplied by collaterals from the L atrial recurrent branch of distal LCx, 75-80% mid to distal LCx stenosis, high grade complex stenosis of OM2, patent LAD and RI, EF 55-60%. On 9/8, single DES to midLCx extending into OM2.   Headache    "weekly" (07/29/2015)   Hypercholesteremia    NSTEMI (non-ST elevated myocardial infarction) (HCC) 07/28/2015    Past Surgical History:  Procedure Laterality Date   CARDIAC CATHETERIZATION  2006; 07/29/2015   CARDIAC CATHETERIZATION N/A 07/29/2015   Procedure: Left Heart Cath and Coronary Angiography;  Surgeon: Lyn Records, MD;  Location: [facility] INVASIVE CV LAB;  Service: Cardiovascular;  Laterality: N/A;   CARDIAC CATHETERIZATION N/A 07/31/2015   Procedure: Coronary Stent Intervention;  Surgeon: Iran Ouch, MD;  Location: [facility] INVASIVE CV LAB;  Service: Cardiovascular;  Laterality: N/A;   CARDIAC CATHETERIZATION N/A 07/31/2015   Procedure: Left Heart Cath and Coronary Angiography;  Surgeon: Iran Ouch, MD;  Location: [facility] INVASIVE CV LAB;  Service: Cardiovascular;  Laterality: N/A;   CHOLECYSTECTOMY     CORONARY CTO INTERVENTION N/A 09/10/2021   Procedure: CORONARY CTO INTERVENTION;  Surgeon: Swazil[place], Onix Jumper M, MD;  Location: [facility] INVASIVE CV LAB;  Service: Cardiovascular;  Laterality: N/A;   INTRAVASCULAR ULTRASOUND/IVUS N/A 09/10/2021   Procedure: Intravascular Ultrasound/IVUS;  Surgeon: Swazil[place], Diamonds Lippard M, MD;  Location: [place] CV LAB;  Service: Cardiovascular;  Laterality: N/A;   LEFT HEART CATH AND CORONARY ANGIOGRAPHY N/A 08/29/2021   Procedure: LEFT HEART CATH AND CORONARY ANGIOGRAPHY;  Surgeon: Martinique, Jeanie Mccard M, MD;  Location: [place] CV LAB;  Service: Cardiovascular;  Laterality: N/A;   TONSILLECTOMY  ~ [place]     Current Outpatient Medications  Medication Sig Dispense Refill   amLODipine (NORVASC) 5 MG tablet Take 1 tablet (5 mg total) by mouth  daily. 90 tablet 3   aspirin 81 MG chewable tablet Chew 1 tablet (81 mg total) by mouth daily.     clopidogrel (PLAVIX) 75 MG tablet Take 1 tablet (75 mg total) by mouth daily. 90 tablet 3   ezetimibe (ZETIA) 10 MG tablet Take 10 mg by mouth daily.     lisinopril (ZESTRIL) 2.5 MG tablet TAKE 2 TABLETS BY MOUTH EVERY DAY 180 tablet 2   metoprolol tartrate (LOPRESSOR) 50 MG tablet TAKE 1 TABLET BY MOUTH TWICE A DAY 180 tablet 1   nitroGLYCERIN (NITROSTAT) 0.4 MG SL tablet Place 1 tablet (0.4 mg total) under the tongue every 5 (five) minutes as needed for chest pain. 25 tablet 3   pantoprazole (PROTONIX) 40 MG tablet Take 40 mg by mouth daily.     rosuvastatin (CRESTOR) 40 MG tablet Take 0.5 tablets (20 mg total) by mouth daily. 90 tablet 3   testosterone cypionate (DEPOTESTOSTERONE CYPIONATE) 200 MG/ML injection Inject 200 mg into the muscle every 14 (fourteen) days.     No current facility-administered medications for this visit.    Allergies:   Patient has no known allergies.    Social History:  The patient  reports that he has been smoking cigarettes and cigars. He has a 5.00 pack-year smoking history. He has quit using smokeless tobacco. He reports current alcohol use. He reports that he does not use drugs.   Family History:  The patient's family history includes Cancer in his father.    ROS:  Please see the history of present illness.   Otherwise, review of systems are positive for none.   All other systems are reviewed and negative.    PHYSICAL EXAM: VS:  BP (!) 148/78   Pulse 65   Ht 5\' 10"  (1.778 m)   Wt 281 lb 3.2 oz (127.6 kg)   SpO2 97%   BMI 40.35 kg/m  , BMI Body mass index is 40.35 kg/m. GEN: Well nourished, overweight, in no acute distress HEENT: normal Neck: no JVD, carotid bruits, or masses Cardiac: RRR; no murmurs, rubs, or gallops,no edema  Respiratory:  clear to auscultation bilaterally, normal work of breathing GI: soft, nontender, nondistended, + BS MS: no  deformity or atrophy Skin: warm and dry, no rash Neuro:  Strength and sensation are intact Psych: euthymic mood, full affect   EKG:  EKG is not ordered today.   Recent Labs: 09/03/2021: ALT 152 09/11/2021: BUN 11; Creatinine, Ser 1.01; Hemoglobin 14.2; Platelets 335; Potassium 3.9; Sodium 135   Dated 03/03/21: cholesterol 222, triglycerides 151, HDL 38, LDL 156. Normal CBC, CMET, TSH  Lipid Panel    Component Value Date/Time   CHOL 121 09/03/2021 1538   CHOL 148 08/28/2021 1041   TRIG 114 09/03/2021 1538   HDL 31 (L) 09/03/2021 1538   HDL 35 (L) 08/28/2021 1041   CHOLHDL 3.9 09/03/2021 1538   VLDL 23 09/03/2021 1538   LDLCALC 67 09/03/2021 1538   LDLCALC 97 08/28/2021 1041  Wt Readings from Last 3 Encounters:  10/01/21 281 lb 3.2 oz (127.6 kg)  09/30/21 278 lb 12.8 oz (126.5 kg)  09/10/21 270 lb (122.5 kg)      Other studies Reviewed: Additional studies/ records that were reviewed today include: . Cardiac cath 07/29/15:  Left Heart Cath and Coronary Angiography   Conclusion  Ost 2nd Mrg to 2nd Mrg lesion, 85% stenosed. Prox Cx lesion, 75% stenosed. Post Atrio lesion, 100% stenosed. Ost LAD to Prox LAD lesion, 30% stenosed. Mid RCA to Dist RCA lesion, 40% stenosed.   Recent acute inferolateral infarction now greater than 18 hours old. Angiography demonstrates total occlusion of the RCA beyond the origin of the PDA. The left ventricular branch is supplied by collaterals from the left atrial recurrent branch of the distal circumflex. 75-80% mid to distal circumflex stenosis. High-grade complex stenosis in the second obtuse marginal branch which is small to moderate in size. Widely patent LAD and a dominant branching ramus intermedius. Overall normal LV function with EF 55-60%, and inferobasal hypokinesis.     Recommendations:   *Dual antiplatelet therapy. Will eventually need PCI on the mid circumflex and second obtuse marginal branch. Not undertaken today due  to acute infarction in the right coronary territory. PCI on the circumflex can be done during this hospital stay or preferably in 7-10 days as needed elective staged procedure. High-dose statin therapy Phase I cardiac rehabilitation in a.m. IV nitroglycerin for an additional 12 hours IV hydration for an additional 12 hours Diagnostic Dominance: Right Intervention Coronary stent 07/31/15:  Coronary Stent Intervention  Left Heart Cath and Coronary Angiography   Conclusion  Post Atrio lesion, 100% stenosed. Ost LAD to Prox LAD lesion, 30% stenosed. Mid RCA to Dist RCA lesion, 40% stenosed. Ost 2nd Mrg to 2nd Mrg lesion, 90% stenosed. There is a 0% residual stenosis post intervention. A drug-eluting stent was placed. Prox Cx lesion, 75% stenosed. There is a 0% residual stenosis post intervention.   1. Continued occlusion of the right posterior AV groove artery with left to right collaterals. Severe mid left circumflex stenosis into OM 2. 2. Mildly elevated left ventricular end-diastolic pressure. 3. Successful angioplasty and drug-eluting stent placement to the mid left circumflex extending into OM 2. I used one long stent which covered both lesions.   Recommendations: Dual antiplatelets therapy for at least one year. Aggressive treatment of risk factors.   Intervention  Implants    Myoview 04/01/17: Study Result  Narrative & Impression  Blood pressure demonstrated a normal response to exercise. Duke treadmill score of 9 consistent with low risk for major cardiac events There was no ST segment deviation noted during stress. Findings consistent with prior inferior myocardial infarction with very mild peri-infarct ischemia. This is a low risk study. Particularly when considering good functional capacity and low risk Duke treadmill score. The left ventricular ejection fraction is normal (55-65%).     Echo 05/22/21: IMPRESSIONS     1. Left ventricular ejection fraction, by  estimation, is 60 to 65%. The  left ventricle has normal function. The left ventricle has no regional  wall motion abnormalities. There is mild left ventricular hypertrophy.  Left ventricular diastolic parameters  are consistent with Grade I diastolic dysfunction (impaired relaxation).   2. Right ventricular systolic function is normal. The right ventricular  size is normal.   3. The mitral valve is normal in structure. No evidence of mitral valve  regurgitation. No evidence of mitral stenosis.   4. The aortic valve has  an indeterminant number of cusps. Aortic valve  regurgitation is mild.   Comparison(s): Previous Echo showed LV EF 55-60%, no RWMA, mild LVH.   CTO PCI 09/10/21: Procedures  CORONARY CTO INTERVENTION  Intravascular Ultrasound/IVUS   Conclusion      Acute Mrg lesion is 95% stenosed.   Dist RCA lesion is 100% stenosed.   Non-stenotic 2nd Mrg lesion was previously treated.   A drug-eluting stent was successfully placed using a SYNERGY XD [id number].   Post intervention, there is a 0% residual stenosis.   Successful CTO PCI of the distal RCA with IVUS guidance and DES x 1 Unsuccessful PCI of the RV marginal branch due to acute angled take off and inability to cross with a wire.    Plan: DAPT for one year. Anticipate DC tomorrow.   Diagnostic Dominance: Right Intervention   ASSESSMENT AND PLAN:  1.  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 Prior stenting of LCx into OM. CTO of the distal RCA. Now s/p successful CTO PCI of the distal RCA with marked improvement in symptoms. Continue DAPT for at least one year. Continue other therapy.  2. Hypercholesterolemia. Last LDL not close to goal. Zetia added. Still not at goal. Seen by Pharm D yesterday with plans to add a PCSK 9 inhibitor.  3. HTN controlled.   Disposition:  follow up in 3-4 months.  Signed, Avarae Zwart Martinique, MD  10/01/2021 2:55 PM    [facility] [address] [place], [place], [address] Phone [phone], Fax [phone]

## 2021-09-30 ENCOUNTER — Ambulatory Visit (INDEPENDENT_AMBULATORY_CARE_PROVIDER_SITE_OTHER): Payer: BC Managed Care – PPO | Admitting: Pharmacist

## 2021-09-30 ENCOUNTER — Other Ambulatory Visit: Payer: Self-pay

## 2021-09-30 VITALS — Wt 278.8 lb

## 2021-09-30 DIAGNOSIS — E785 Hyperlipidemia, unspecified: Secondary | ICD-10-CM | POA: Diagnosis not present

## 2021-09-30 DIAGNOSIS — E782 Mixed hyperlipidemia: Secondary | ICD-10-CM | POA: Diagnosis not present

## 2021-09-30 DIAGNOSIS — I2510 Atherosclerotic heart disease of native coronary artery without angina pectoris: Secondary | ICD-10-CM

## 2021-09-30 NOTE — Progress Notes (Signed)
Patient ID: TIRRELL BUCHBERGER                 DOB: 09-03-1971                    MRN: [id number]     HPI: Alex Barnes is a 50 y.o. male patient referred to lipid clinic by Dr Excell Seltzer. PMH is significant for CAD, NSTEMI, TIA, HLD, and stent placement on 09/10/21.  Patient had been on rosuvastatin 40mg  but had an increase in LFTs so dosage was reduced to 20mg .  Remains on Zetia 10mg .  Patient presents today in good spirits.  Reports he feels great. Has no chest pain or SOB.    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  Reports he likes to eat steak and potatoes.  Has a family history of CAD on maternal side. Grandmother had MI in her 57s.   He drinks alcohol weekly and smokes cigars.  Current Medications:  Rosuvastatin 20mg  daily Zetia 10mg  daily  Intolerances: rosuvastatin 40 mg (elevated LFTs) Risk Factors: NSTEMI, CAD, TIA LDL goal: <55  TC 121, Trigs 114, HDL 31, LDL 67 (09/03/21 - rosuvastatin 20mg , Zetia 10mg )  Past Medical History:  Diagnosis Date   CAD (coronary artery disease)    cath 9/26 & 07/31/2015 100% total occlusion of post atrio branch of RCA, LV supplied by collaterals from the L atrial recurrent branch of distal LCx, 75-80% mid to distal LCx stenosis, high grade complex stenosis of OM2, patent LAD and RI, EF 55-60%. On 9/8, single DES to midLCx extending into OM2.   Headache    "weekly" (07/29/2015)   Hypercholesteremia    NSTEMI (non-ST elevated myocardial infarction) (HCC) 07/28/2015    Current Outpatient Medications on File Prior to Visit  Medication Sig Dispense Refill   amLODipine (NORVASC) 5 MG tablet Take 1 tablet (5 mg total) by mouth daily. 90 tablet 3   Ascorbic Acid (VITAMIN C PO) Take 1 tablet by mouth daily.     aspirin 81 MG chewable tablet Chew 1 tablet (81 mg total) by mouth daily.     clopidogrel (PLAVIX) 75 MG tablet Take 1 tablet (75 mg total) by mouth  daily. 90 tablet 3   ezetimibe (ZETIA) 10 MG tablet Take 10 mg by mouth daily.     ibuprofen (ADVIL) 200 MG tablet Take 400-800 mg by mouth every 6 (six) hours as needed for headache or moderate pain.     lisinopril (ZESTRIL) 2.5 MG tablet TAKE 2 TABLETS BY MOUTH EVERY DAY 180 tablet 2   metoprolol tartrate (LOPRESSOR) 50 MG tablet TAKE 1 TABLET BY MOUTH TWICE A DAY 180 tablet 1   nitroGLYCERIN (NITROSTAT) 0.4 MG SL tablet Place 1 tablet (0.4 mg total) under the tongue every 5 (five) minutes as needed for chest pain. 25 tablet 3   pantoprazole (PROTONIX) 40 MG tablet Take 40 mg by mouth daily.     rosuvastatin (CRESTOR) 40 MG tablet Take 0.5 tablets (20 mg total) by mouth daily. 90 tablet 3   testosterone cypionate (DEPOTESTOSTERONE CYPIONATE) 200 MG/ML injection Inject 200 mg into the muscle every 14 (fourteen) days.     No current facility-administered medications on file prior to visit.    No Known Allergies  Assessment/Plan:  1. Hyperlipidemia - Patient most recent LDL 67 which is above goal of <55.  Aggressive goal selected  due to patients recurrent events such as MI and TIA.  Since patient currently on high intensity statin, next step would be PCSK9i.  Using demo pen, educated patient on storage, mechanism of action, site selection, administration, and possible adverse effects.  Patient was able to demonstrate in room.  Will complete PA and activate copay card. Recheck lipid panel in 2-3 months.  Continue rosuvastatin 20mg  daily Continue ezetimibe 10mg  daily Start Repatha/Praluent q 14 days Recheck lipid panel in 2-3 months  , PharmD, BCACP, CDCES, CPP [address] [place], [address], [place] Phone: [phone], Fax: [phone]

## 2021-09-30 NOTE — Patient Instructions (Signed)
It was nice meeting you today  We would like your LDL (bad cholesterol) to be less than 55  Continue your rosuvastatin 20mg  and your ezetimibe 10mg  once a day  We will start a new medication for you that you will inject once every 2 weeks  We will complete the prior authorization for you and activate a copay card  Once you start the new medication we will recheck your cholesterol in 2-3 months  Please call with any questions!  , PharmD, BCACP, CDCES, CPP [address] [place], [address], [place] Phone: [phone], Fax: [phone]

## 2021-10-01 ENCOUNTER — Encounter: Payer: Self-pay | Admitting: Cardiology

## 2021-10-01 ENCOUNTER — Ambulatory Visit (INDEPENDENT_AMBULATORY_CARE_PROVIDER_SITE_OTHER): Payer: BC Managed Care – PPO | Admitting: Cardiology

## 2021-10-01 VITALS — BP 148/78 | HR 65 | Ht 70.0 in | Wt 281.2 lb

## 2021-10-01 DIAGNOSIS — E785 Hyperlipidemia, unspecified: Secondary | ICD-10-CM

## 2021-10-01 DIAGNOSIS — I208 Other forms of angina pectoris: Secondary | ICD-10-CM

## 2021-10-01 DIAGNOSIS — I10 Essential (primary) hypertension: Secondary | ICD-10-CM | POA: Diagnosis not present

## 2021-10-01 DIAGNOSIS — I2510 Atherosclerotic heart disease of native coronary artery without angina pectoris: Secondary | ICD-10-CM | POA: Diagnosis not present

## 2021-10-16 ENCOUNTER — Telehealth: Payer: Self-pay

## 2021-10-16 DIAGNOSIS — E785 Hyperlipidemia, unspecified: Secondary | ICD-10-CM

## 2021-10-16 MED ORDER — REPATHA SURECLICK 140 MG/ML ~~LOC~~ SOAJ: 140.0000 mg | SUBCUTANEOUS | 11 refills | Status: DC

## 2021-10-16 NOTE — Telephone Encounter (Signed)
-----   Message from Cheree Ditto, [facility] sent at 10/16/2021  3:08 PM EST ----- Regarding: RE: Pa denied Yes please, to both ----- Message ----- From: Eather Colas, CMA Sent: 10/16/2021  12:55 PM EST To: Cheree Ditto, RPH Subject: RE: Pa denied                                  Approved, Coverage Starts on: 10/06/2021 12:00:00 AM, Coverage Ends on: 04/04/2022 may I have permission to order lipid panel and contact pt to get copay card approved? ----- Message ----- From: Cheree Ditto, [facility] Sent: 10/16/2021  12:21 PM EST To: Eather Colas, CMA Subject: RE: Pa denied                                  Do you think it went through now?  ----- Message ----- From: Eather Colas, CMA Sent: 10/06/2021   7:24 AM EST To: Cheree Ditto, RPH Subject: RE: Pa denied                                  I resent to plan as I think you may have used the most recent ldl of 67 when I cheated and used the one before it a month prior because they think under 70 don't need it ----- Message ----- From: Cheree Ditto, [facility] Sent: 10/01/2021  10:22 AM EST To: Eather Colas, CMA Subject: Pa denied                                      MY PA was denied.  Please check on.  Key:  [id number]

## 2021-10-16 NOTE — Telephone Encounter (Signed)
Called and spoke to pts wife regarding the repatha approval, rx sent, pt instructed to complete fasting labs post 4th dose and a copay card was emailed to the pt as requested and is as follows: RxBin: [id number] RxPCN: [id number] RxGrp: [id number] ID: [id number]

## 2021-11-14 ENCOUNTER — Other Ambulatory Visit: Payer: Self-pay

## 2021-11-14 ENCOUNTER — Ambulatory Visit (INDEPENDENT_AMBULATORY_CARE_PROVIDER_SITE_OTHER): Payer: BC Managed Care – PPO

## 2021-11-14 DIAGNOSIS — G4733 Obstructive sleep apnea (adult) (pediatric): Secondary | ICD-10-CM | POA: Diagnosis not present

## 2021-11-18 ENCOUNTER — Telehealth: Payer: Self-pay | Admitting: Pulmonary Disease

## 2021-11-18 DIAGNOSIS — G4733 Obstructive sleep apnea (adult) (pediatric): Secondary | ICD-10-CM | POA: Diagnosis not present

## 2021-11-18 NOTE — Telephone Encounter (Signed)
HST showed severe OSA with AHI 33/ hr Suggest autoCPAP  5-15 cm, mask of choice OV with me in 6 wks after starting CPAP

## 2021-11-18 NOTE — Telephone Encounter (Signed)
Relayed results to patient. He voiced understanding. Order placed. Nothing further needed.

## 2021-11-19 ENCOUNTER — Other Ambulatory Visit: Payer: Self-pay | Admitting: Cardiology

## 2021-12-11 ENCOUNTER — Other Ambulatory Visit: Payer: Self-pay | Admitting: Cardiology

## 2021-12-23 NOTE — Progress Notes (Signed)
Interventional cardiology note:   Date:  12/30/2021   ID:  Alex Barnes, DOB 01-06-1971, MRN [id number]  PCP:  Caryl Bis, MD  Cardiologist:   Blessed Girdner Martinique, MD   Chief Complaint  Patient presents with   Coronary Artery Disease       History of Present Illness: Alex Barnes is a 51 y.o. male who is seen for follow up post PCI.  2016 Cardiac catheterization with totally occluded RCA PL branch as well as significant LCx and OM disease.  Status post DES to LCx and OM 2, PL occlusion managed medically. Echocardiogram 2016 LVEF 55 to 60%, no WMA's. Treated for post MI pericarditis with colchicine aspirin and NSAIDs. On follow up he had persistent chest pain despite medical therapy. Last Myoview in 2018 showed inferior infarct with peri infarct ischemia and normal EF at good exercise tolerance. Echo in July showed Normal LV function. He apparently had cardiac cath in May 2021 at [facility] showing occlusion of RCA without any new disease.   He reports symptoms of left precordial chest pain with exertion or stress. Sometimes radiates to his left arm and is associated with SOB and feeling clammy. Typically relieved with rest but may take 30 minutes to 4 hours to go away. Does get relief with Ntg but this gives him a severe HA so he doesn't like to take it. Has tried isosorbide with similar HA. Now on amlodipine and metoprolol. Feels his symptoms are getting worse and is negatively impacting his quality of life. For this reason he underwent CTO PCI of the distal RCA on 09/10/21. This was successful. Unable to pass a wire into the RV marginal branch.   On follow up today he notes that his anginal symptoms have resolved. He still feels great. He is able to do things now that he couldn't before and he feels it is life changing. He is now on Repatha. Does note joint pains in hips and knees similar to what he felt on statins before.   Past Medical History:  Diagnosis Date   CAD  (coronary artery disease)    cath 9/26 & 07/31/2015 100% total occlusion of post atrio branch of RCA, LV supplied by collaterals from the L atrial recurrent branch of distal LCx, 75-80% mid to distal LCx stenosis, high grade complex stenosis of OM2, patent LAD and RI, EF 55-60%. On 9/8, single DES to midLCx extending into OM2.   Headache    "weekly" (07/29/2015)   Hypercholesteremia    NSTEMI (non-ST elevated myocardial infarction) ([place]) 07/28/2015    Past Surgical History:  Procedure Laterality Date   CARDIAC CATHETERIZATION  2006; 07/29/2015   CARDIAC CATHETERIZATION N/A 07/29/2015   Procedure: Left Heart Cath and Coronary Angiography;  Surgeon: Belva Crome, MD;  Location: [place] [facility];  Service: Cardiovascular;  Laterality: N/A;   CARDIAC CATHETERIZATION N/A 07/31/2015   Procedure: Coronary Stent Intervention;  Surgeon: Wellington Hampshire, MD;  Location: [place] [facility];  Service: Cardiovascular;  Laterality: N/A;   CARDIAC CATHETERIZATION N/A 07/31/2015   Procedure: Left Heart Cath and Coronary Angiography;  Surgeon: Wellington Hampshire, MD;  Location: [place] [facility];  Service: Cardiovascular;  Laterality: N/A;   CHOLECYSTECTOMY     CORONARY CTO INTERVENTION N/A 09/10/2021   Procedure: CORONARY CTO INTERVENTION;  Surgeon: Martinique, Yair Dusza M, MD;  Location: [place] [facility];  Service: Cardiovascular;  Laterality: N/A;   INTRAVASCULAR ULTRASOUND/IVUS N/A 09/10/2021   Procedure: Intravascular  Ultrasound/IVUS;  Surgeon: Martinique, Nylan Nakatani M, MD;  Location: [place] [facility];  Service: Cardiovascular;  Laterality: N/A;   LEFT HEART CATH AND CORONARY ANGIOGRAPHY N/A 08/29/2021   Procedure: LEFT HEART CATH AND CORONARY ANGIOGRAPHY;  Surgeon: Martinique, Graylee Arutyunyan M, MD;  Location: [place] [facility];  Service: Cardiovascular;  Laterality: N/A;   TONSILLECTOMY  ~ [place]     Current Outpatient Medications  Medication Sig Dispense Refill   amLODipine (NORVASC) 5 MG tablet  Take 1 tablet (5 mg total) by mouth daily. 90 tablet 3   aspirin 81 MG chewable tablet Chew 1 tablet (81 mg total) by mouth daily.     clopidogrel (PLAVIX) 75 MG tablet Take 1 tablet (75 mg total) by mouth daily. 90 tablet 3   Evolocumab (REPATHA SURECLICK) [id number] MG/ML SOAJ Inject 140 mg into the skin every 14 (fourteen) days. 2 mL 11   ezetimibe (ZETIA) 10 MG tablet Take 10 mg by mouth daily.     lisinopril (ZESTRIL) 2.5 MG tablet TAKE 2 TABLETS BY MOUTH EVERY DAY 180 tablet 2   metoprolol tartrate (LOPRESSOR) 50 MG tablet TAKE 1 TABLET BY MOUTH TWICE A DAY 60 tablet 0   nitroGLYCERIN (NITROSTAT) 0.4 MG SL tablet Place 1 tablet (0.4 mg total) under the tongue every 5 (five) minutes as needed for chest pain. 25 tablet 3   pantoprazole (PROTONIX) 40 MG tablet Take 40 mg by mouth daily.     rosuvastatin (CRESTOR) 10 MG tablet Take 1 tablet (10 mg total) by mouth daily. 90 tablet 3   testosterone cypionate (DEPOTESTOSTERONE CYPIONATE) 200 MG/ML injection Inject 200 mg into the muscle every 14 (fourteen) days.     No current facility-administered medications for this visit.    Allergies:   Patient has no known allergies.    Social History:  The patient  reports that he has been smoking cigarettes and cigars. He has a 5.00 pack-year smoking history. He has quit using smokeless tobacco. He reports current alcohol use. He reports that he does not use drugs.   Family History:  The patient's family history includes Cancer in his father.    ROS:  Please see the history of present illness.   Otherwise, review of systems are positive for none.   All other systems are reviewed and negative.    PHYSICAL EXAM: VS:  BP 136/78    Pulse 72    Ht 5\' 10"  (1.778 m)    Wt 286 lb 3.2 oz (129.8 kg)    BMI 41.07 kg/m  , BMI Body mass index is 41.07 kg/m. GEN: Well nourished, overweight, in no acute distress HEENT: normal Neck: no JVD, carotid bruits, or masses Cardiac: RRR; no murmurs, rubs, or gallops,no  edema  Respiratory:  clear to auscultation bilaterally, normal work of breathing GI: soft, nontender, nondistended, + BS MS: no deformity or atrophy Skin: warm and dry, no rash Neuro:  Strength and sensation are intact Psych: euthymic mood, full affect   EKG:  EKG is not ordered today.   Recent Labs: 09/03/2021: ALT 152 09/11/2021: BUN 11; Creatinine, Ser 1.01; Hemoglobin 14.2; Platelets 335; Potassium 3.9; Sodium 135   Dated 03/03/21: cholesterol 222, triglycerides 151, HDL 38, LDL 156. Normal CBC, CMET, TSH  Lipid Panel    Component Value Date/Time   CHOL 121 09/03/2021 1538   CHOL 148 08/28/2021 1041   TRIG 114 09/03/2021 1538   HDL 31 (L) 09/03/2021 1538   HDL 35 (  L) 08/28/2021 1041   CHOLHDL 3.9 09/03/2021 1538   VLDL 23 09/03/2021 1538   LDLCALC 67 09/03/2021 1538   LDLCALC 97 08/28/2021 1041      Wt Readings from Last 3 Encounters:  12/30/21 286 lb 3.2 oz (129.8 kg)  10/01/21 281 lb 3.2 oz (127.6 kg)  09/30/21 278 lb 12.8 oz (126.5 kg)      Other studies Reviewed: Additional studies/ records that were reviewed today include: . Cardiac cath 07/29/15:  Left Heart Cath and Coronary Angiography   Conclusion  Ost 2nd Mrg to 2nd Mrg lesion, 85% stenosed. Prox Cx lesion, 75% stenosed. Post Atrio lesion, 100% stenosed. Ost LAD to Prox LAD lesion, 30% stenosed. Mid RCA to Dist RCA lesion, 40% stenosed.   Recent acute inferolateral infarction now greater than 18 hours old. Angiography demonstrates total occlusion of the RCA beyond the origin of the PDA. The left ventricular branch is supplied by collaterals from the left atrial recurrent branch of the distal circumflex. 75-80% mid to distal circumflex stenosis. High-grade complex stenosis in the second obtuse marginal branch which is small to moderate in size. Widely patent LAD and a dominant branching ramus intermedius. Overall normal LV function with EF 55-60%, and inferobasal hypokinesis.     Recommendations:    *Dual antiplatelet therapy. Will eventually need PCI on the mid circumflex and second obtuse marginal branch. Not undertaken today due to acute infarction in the right coronary territory. PCI on the circumflex can be done during this hospital stay or preferably in 7-10 days as needed elective staged procedure. High-dose statin therapy Phase I cardiac rehabilitation in a.m. IV nitroglycerin for an additional 12 hours IV hydration for an additional 12 hours Diagnostic Dominance: Right Intervention Coronary stent 07/31/15:  Coronary Stent Intervention  Left Heart Cath and Coronary Angiography   Conclusion  Post Atrio lesion, 100% stenosed. Ost LAD to Prox LAD lesion, 30% stenosed. Mid RCA to Dist RCA lesion, 40% stenosed. Ost 2nd Mrg to 2nd Mrg lesion, 90% stenosed. There is a 0% residual stenosis post intervention. A drug-eluting stent was placed. Prox Cx lesion, 75% stenosed. There is a 0% residual stenosis post intervention.   1. Continued occlusion of the right posterior AV groove artery with left to right collaterals. Severe mid left circumflex stenosis into OM 2. 2. Mildly elevated left ventricular end-diastolic pressure. 3. Successful angioplasty and drug-eluting stent placement to the mid left circumflex extending into OM 2. I used one long stent which covered both lesions.   Recommendations: Dual antiplatelets therapy for at least one year. Aggressive treatment of risk factors.   Intervention  Implants    Myoview 04/01/17: Study Result  Narrative & Impression  Blood pressure demonstrated a normal response to exercise. Duke treadmill score of 9 consistent with low risk for major cardiac events There was no ST segment deviation noted during stress. Findings consistent with prior inferior myocardial infarction with very mild peri-infarct ischemia. This is a low risk study. Particularly when considering good functional capacity and low risk Duke treadmill score. The left  ventricular ejection fraction is normal (55-65%).     Echo 05/22/21: IMPRESSIONS     1. Left ventricular ejection fraction, by estimation, is 60 to 65%. The  left ventricle has normal function. The left ventricle has no regional  wall motion abnormalities. There is mild left ventricular hypertrophy.  Left ventricular diastolic parameters  are consistent with Grade I diastolic dysfunction (impaired relaxation).   2. Right ventricular systolic function is normal. The right  ventricular  size is normal.   3. The mitral valve is normal in structure. No evidence of mitral valve  regurgitation. No evidence of mitral stenosis.   4. The aortic valve has an indeterminant number of cusps. Aortic valve  regurgitation is mild.   Comparison(s): Previous Echo showed LV EF 55-60%, no RWMA, mild LVH.   CTO PCI 09/10/21: Procedures  CORONARY CTO INTERVENTION  Intravascular Ultrasound/IVUS   Conclusion      Acute Mrg lesion is 95% stenosed.   Dist RCA lesion is 100% stenosed.   Non-stenotic 2nd Mrg lesion was previously treated.   A drug-eluting stent was successfully placed using a SYNERGY XD 2.25X28.   Post intervention, there is a 0% residual stenosis.   Successful CTO PCI of the distal RCA with IVUS guidance and DES x 1 Unsuccessful PCI of the RV marginal branch due to acute angled take off and inability to cross with a wire.    Plan: DAPT for one year. Anticipate DC tomorrow.   Diagnostic Dominance: Right Intervention   ASSESSMENT AND PLAN:  1.  CAD  Now s/p successful CTO PCI of the distal RCA with marked improvement in symptoms. Now class 1. Continue DAPT for at least one year. Continue other therapy.  2. Hypercholesterolemia. Now on Repatha. Having some joint pains likely related to Crestor. Will reduce Crestor dose to 10 mg daily. Recheck lab in 3 months.   3. HTN controlled.   Disposition:  follow up in 6 months.  Signed, Finnlee Silvernail Martinique, MD  12/30/2021 2:00 PM    [facility] [address] [place], [place], [address] Phone [phone], Fax [phone]

## 2021-12-25 DIAGNOSIS — G4733 Obstructive sleep apnea (adult) (pediatric): Secondary | ICD-10-CM | POA: Diagnosis not present

## 2021-12-30 ENCOUNTER — Other Ambulatory Visit: Payer: Self-pay

## 2021-12-30 ENCOUNTER — Encounter: Payer: Self-pay | Admitting: Cardiology

## 2021-12-30 ENCOUNTER — Ambulatory Visit: Payer: BC Managed Care – PPO | Admitting: Cardiology

## 2021-12-30 VITALS — BP 136/78 | HR 72 | Ht 70.0 in | Wt 286.2 lb

## 2021-12-30 DIAGNOSIS — E782 Mixed hyperlipidemia: Secondary | ICD-10-CM | POA: Diagnosis not present

## 2021-12-30 DIAGNOSIS — I208 Other forms of angina pectoris: Secondary | ICD-10-CM

## 2021-12-30 DIAGNOSIS — I2510 Atherosclerotic heart disease of native coronary artery without angina pectoris: Secondary | ICD-10-CM

## 2021-12-30 DIAGNOSIS — I10 Essential (primary) hypertension: Secondary | ICD-10-CM

## 2021-12-30 MED ORDER — ROSUVASTATIN CALCIUM 10 MG PO TABS: 10.0000 mg | ORAL_TABLET | Freq: Every day | ORAL | 3 refills | Status: DC

## 2021-12-30 NOTE — Patient Instructions (Signed)
Reduce Crestor to 10 mg daily

## 2022-01-11 ENCOUNTER — Other Ambulatory Visit: Payer: Self-pay | Admitting: Cardiology

## 2022-01-22 DIAGNOSIS — G4733 Obstructive sleep apnea (adult) (pediatric): Secondary | ICD-10-CM | POA: Diagnosis not present

## 2022-02-22 DIAGNOSIS — G4733 Obstructive sleep apnea (adult) (pediatric): Secondary | ICD-10-CM | POA: Diagnosis not present

## 2022-03-06 ENCOUNTER — Ambulatory Visit: Payer: BC Managed Care – PPO | Admitting: Pulmonary Disease

## 2022-03-16 DIAGNOSIS — E291 Testicular hypofunction: Secondary | ICD-10-CM | POA: Diagnosis not present

## 2022-03-16 DIAGNOSIS — Z0001 Encounter for general adult medical examination with abnormal findings: Secondary | ICD-10-CM | POA: Diagnosis not present

## 2022-03-16 DIAGNOSIS — E782 Mixed hyperlipidemia: Secondary | ICD-10-CM | POA: Diagnosis not present

## 2022-03-16 DIAGNOSIS — R739 Hyperglycemia, unspecified: Secondary | ICD-10-CM | POA: Diagnosis not present

## 2022-03-19 ENCOUNTER — Other Ambulatory Visit: Payer: Self-pay

## 2022-03-19 MED ORDER — ROSUVASTATIN CALCIUM 10 MG PO TABS: 10.0000 mg | ORAL_TABLET | Freq: Every day | ORAL | 3 refills | Status: DC

## 2022-03-24 DIAGNOSIS — G4733 Obstructive sleep apnea (adult) (pediatric): Secondary | ICD-10-CM | POA: Diagnosis not present

## 2022-03-31 DIAGNOSIS — E291 Testicular hypofunction: Secondary | ICD-10-CM | POA: Diagnosis not present

## 2022-04-14 DIAGNOSIS — E291 Testicular hypofunction: Secondary | ICD-10-CM | POA: Diagnosis not present

## 2022-04-22 ENCOUNTER — Other Ambulatory Visit: Payer: Self-pay | Admitting: Cardiology

## 2022-04-24 DIAGNOSIS — G4733 Obstructive sleep apnea (adult) (pediatric): Secondary | ICD-10-CM | POA: Diagnosis not present

## 2022-05-04 DIAGNOSIS — E291 Testicular hypofunction: Secondary | ICD-10-CM | POA: Diagnosis not present

## 2022-05-08 DIAGNOSIS — E8881 Metabolic syndrome: Secondary | ICD-10-CM | POA: Diagnosis not present

## 2022-05-08 DIAGNOSIS — E7849 Other hyperlipidemia: Secondary | ICD-10-CM | POA: Diagnosis not present

## 2022-05-08 DIAGNOSIS — E291 Testicular hypofunction: Secondary | ICD-10-CM | POA: Diagnosis not present

## 2022-05-08 DIAGNOSIS — Z0001 Encounter for general adult medical examination with abnormal findings: Secondary | ICD-10-CM | POA: Diagnosis not present

## 2022-05-15 ENCOUNTER — Other Ambulatory Visit: Payer: Self-pay

## 2022-05-18 DIAGNOSIS — Z6841 Body Mass Index (BMI) 40.0 and over, adult: Secondary | ICD-10-CM | POA: Diagnosis not present

## 2022-05-18 DIAGNOSIS — E291 Testicular hypofunction: Secondary | ICD-10-CM | POA: Diagnosis not present

## 2022-05-18 DIAGNOSIS — E538 Deficiency of other specified B group vitamins: Secondary | ICD-10-CM | POA: Diagnosis not present

## 2022-05-18 MED ORDER — ROSUVASTATIN CALCIUM 10 MG PO TABS: 10.0000 mg | ORAL_TABLET | Freq: Every day | ORAL | 3 refills | Status: DC

## 2022-05-20 ENCOUNTER — Other Ambulatory Visit: Payer: Self-pay | Admitting: Cardiology

## 2022-05-24 ENCOUNTER — Other Ambulatory Visit: Payer: Self-pay | Admitting: Cardiology

## 2022-05-24 DIAGNOSIS — G4733 Obstructive sleep apnea (adult) (pediatric): Secondary | ICD-10-CM | POA: Diagnosis not present

## 2022-05-28 DIAGNOSIS — G4733 Obstructive sleep apnea (adult) (pediatric): Secondary | ICD-10-CM | POA: Diagnosis not present

## 2022-06-01 DIAGNOSIS — E291 Testicular hypofunction: Secondary | ICD-10-CM | POA: Diagnosis not present

## 2022-06-15 DIAGNOSIS — E291 Testicular hypofunction: Secondary | ICD-10-CM | POA: Diagnosis not present

## 2022-06-17 ENCOUNTER — Other Ambulatory Visit: Payer: Self-pay | Admitting: Cardiology

## 2022-06-21 NOTE — Progress Notes (Deleted)
Interventional cardiology note:   Date:  06/21/2022   ID:  Alex Barnes, DOB February 23, 1971, MRN [id number]  PCP:  Richardean Chimera, MD  Cardiologist:   Josian Lanese [place], MD   No chief complaint on file.      History of Present Illness: Alex Barnes is a 51 y.o. male who is seen for follow up post PCI.  2016 Cardiac catheterization with totally occluded RCA PL branch as well as significant LCx and OM disease.  Status post DES to LCx and OM 2, PL occlusion managed medically. Echocardiogram 2016 LVEF 55 to 60%, no WMA's. Treated for post MI pericarditis with colchicine aspirin and NSAIDs. On follow up he had persistent chest pain despite medical therapy. Last Myoview in 2018 showed inferior infarct with peri infarct ischemia and normal EF at good exercise tolerance. Echo in July showed Normal LV function. He apparently had cardiac cath in May 2021 at [facility] showing occlusion of RCA without any new disease.   He reports symptoms of left precordial chest pain with exertion or stress. Sometimes radiates to his left arm and is associated with SOB and feeling clammy. Typically relieved with rest but may take 30 minutes to 4 hours to go away. Does get relief with Ntg but this gives him a severe HA so he doesn't like to take it. Has tried isosorbide with similar HA. Now on amlodipine and metoprolol. Feels his symptoms are getting worse and is negatively impacting his quality of life. For this reason he underwent CTO PCI of the distal RCA on 09/10/21. This was successful. Unable to pass a wire into the RV marginal branch.   On follow up today he notes that his anginal symptoms have resolved. He still feels great. He is able to do things now that he couldn't before and he feels it is life changing. He is now on Repatha. Does note joint pains in hips and knees similar to what he felt on statins before.   Past Medical History:  Diagnosis Date   CAD (coronary artery disease)    cath 9/26 &  07/31/2015 100% total occlusion of post atrio branch of RCA, LV supplied by collaterals from the L atrial recurrent branch of distal LCx, 75-80% mid to distal LCx stenosis, high grade complex stenosis of OM2, patent LAD and RI, EF 55-60%. On 9/8, single DES to midLCx extending into OM2.   Headache    "weekly" (07/29/2015)   Hypercholesteremia    NSTEMI (non-ST elevated myocardial infarction) ([facility]) 07/28/2015    Past Surgical History:  Procedure Laterality Date   CARDIAC CATHETERIZATION  2006; 07/29/2015   CARDIAC CATHETERIZATION N/A 07/29/2015   Procedure: Left Heart Cath and Coronary Angiography;  Surgeon: Lyn Records, MD;  Location: [facility] INVASIVE [facility];  Service: Cardiovascular;  Laterality: N/A;   CARDIAC CATHETERIZATION N/A 07/31/2015   Procedure: Coronary Stent Intervention;  Surgeon: Iran Ouch, MD;  Location: [facility] INVASIVE CV LAB;  Service: Cardiovascular;  Laterality: N/A;   CARDIAC CATHETERIZATION N/A 07/31/2015   Procedure: Left Heart Cath and Coronary Angiography;  Surgeon: Iran Ouch, MD;  Location: [facility] INVASIVE [facility] LAB;  Service: Cardiovascular;  Laterality: N/A;   CHOLECYSTECTOMY     CORONARY CTO INTERVENTION N/A 09/10/2021   Procedure: CORONARY CTO INTERVENTION;  Surgeon: Swazil[place], Marsa Matteo M, MD;  Location: [facility] INVASIVE CV LAB;  Service: Cardiovascular;  Laterality: N/A;   INTRAVASCULAR ULTRASOUND/IVUS N/A 09/10/2021   Procedure: Intravascular Ultrasound/IVUS;  Surgeon: Sw[place]il[place], Tayveon Lombardo M, MD;  Location: [facility] INVASIVE [facility] LAB;  Service: Cardiovascular;  Laterality: N/A;   LEFT HEART CATH AND CORONARY ANGIOGRAPHY N/A 08/29/2021   Procedure: LEFT HEART CATH AND CORONARY ANGIOGRAPHY;  Surgeon: Sw[place]il[place], Umar Patmon M, MD;  Location: [facility] INVASIVE [facility] LAB;  Service: Cardiovascular;  Laterality: N/A;   TONSILLECTOMY  ~ 1989   WISDOM TOOTH EXTRACTION  1993     Current Outpatient Medications  Medication Sig Dispense Refill   amLODipine (NORVASC) 5 MG tablet Take 1 tablet (5 mg total) by mouth daily.  90 tablet 3   aspirin 81 MG chewable tablet Chew 1 tablet (81 mg total) by mouth daily.     clopidogrel (PLAVIX) 75 MG tablet Take 1 tablet (75 mg total) by mouth daily. 90 tablet 3   Evolocumab (REPATHA SURECLICK) 140 MG/ML SOAJ Inject 140 mg into the skin every 14 (fourteen) days. 2 mL 11   ezetimibe (ZETIA) 10 MG tablet Take 10 mg by mouth daily.     lisinopril (ZESTRIL) 2.5 MG tablet TAKE 2 TABLETS BY MOUTH EVERY DAY 180 tablet 2   metoprolol tartrate (LOPRESSOR) 50 MG tablet TAKE 1 TABLET BY MOUTH TWICE A DAY 120 tablet 3   nitroGLYCERIN (NITROSTAT) 0.4 MG SL tablet Place 1 tablet (0.4 mg total) under the tongue every 5 (five) minutes as needed for chest pain. 25 tablet 3   pantoprazole (PROTONIX) 40 MG tablet Take 40 mg by mouth daily.     rosuvastatin (CRESTOR) 10 MG tablet Take 1 tablet (10 mg total) by mouth daily. 90 tablet 3   testosterone cypionate (DEPOTESTOSTERONE CYPIONATE) 200 MG/ML injection Inject 200 mg into the muscle every 14 (fourteen) days.     No current facility-administered medications for this visit.    Allergies:   Patient has no known allergies.    Social History:  The patient  reports that he has been smoking cigarettes and cigars. He has a 5.00 pack-year smoking history. He has quit using smokeless tobacco. He reports current alcohol use. He reports that he does not use drugs.   Family History:  The patient's family history includes Cancer in his father.    ROS:  Please see the history of present illness.   Otherwise, review of systems are positive for none.   All other systems are reviewed and negative.    PHYSICAL EXAM: VS:  There were no vitals taken for this visit. , BMI There is no height or weight on file to calculate BMI. GEN: Well nourished, overweight, in no acute distress HEENT: normal Neck: no JVD, carotid bruits, or masses Cardiac: RRR; no murmurs, rubs, or gallops,no edema  Respiratory:  clear to auscultation bilaterally, normal work of  breathing GI: soft, nontender, nondistended, + BS MS: no deformity or atrophy Skin: warm and dry, no rash Neuro:  Strength and sensation are intact Psych: euthymic mood, full affect   EKG:  EKG is not ordered today.   Recent Labs: 09/03/2021: ALT 152 09/11/2021: BUN 11; Creatinine, Ser 1.01; Hemoglobin 14.2; Platelets 335; Potassium 3.9; Sodium 135   Dated 03/03/21: cholesterol 222, triglycerides 151, HDL 38, LDL 156. Normal CBC, CMET, TSH Dated 03/16/22: cholesterol 88, triglycerides 112, HDL 35, LDL 32. CBC, CMET and TSH normal.   Lipid Panel    Component Value Date/Time   CHOL 121 09/03/2021 1538   CHOL 148 08/28/2021 1041   TRIG 114 09/03/2021 1538   HDL 31 (L) 09/03/2021 1538   HDL 35 (L) 08/28/2021 1041   CHOLHDL 3.9 09/03/2021 1538  VLDL 23 09/03/2021 1538   LDLCALC 67 09/03/2021 1538   LDLCALC 97 08/28/2021 1041      Wt Readings from Last 3 Encounters:  12/30/21 286 lb 3.2 oz (129.8 kg)  10/01/21 281 lb 3.2 oz (127.6 kg)  09/30/21 278 lb 12.8 oz (126.5 kg)      Other studies Reviewed: Additional studies/ records that were reviewed today include: . Cardiac cath 07/29/15:  Left Heart Cath and Coronary Angiography   Conclusion  Ost 2nd Mrg to 2nd Mrg lesion, 85% stenosed. Prox Cx lesion, 75% stenosed. Post Atrio lesion, 100% stenosed. Ost LAD to Prox LAD lesion, 30% stenosed. Mid RCA to Dist RCA lesion, 40% stenosed.   Recent acute inferolateral infarction now greater than 18 hours old. Angiography demonstrates total occlusion of the RCA beyond the origin of the PDA. The left ventricular branch is supplied by collaterals from the left atrial recurrent branch of the distal circumflex. 75-80% mid to distal circumflex stenosis. High-grade complex stenosis in the second obtuse marginal branch which is small to moderate in size. Widely patent LAD and a dominant branching ramus intermedius. Overall normal LV function with EF 55-60%, and inferobasal hypokinesis.      Recommendations:   *Dual antiplatelet therapy. Will eventually need PCI on the mid circumflex and second obtuse marginal branch. Not undertaken today due to acute infarction in the right coronary territory. PCI on the circumflex can be done during this hospital stay or preferably in 7-10 days as needed elective staged procedure. High-dose statin therapy Phase I cardiac rehabilitation in a.m. IV nitroglycerin for an additional 12 hours IV hydration for an additional 12 hours Diagnostic Dominance: Right Intervention Coronary stent 07/31/15:  Coronary Stent Intervention  Left Heart Cath and Coronary Angiography   Conclusion  Post Atrio lesion, 100% stenosed. Ost LAD to Prox LAD lesion, 30% stenosed. Mid RCA to Dist RCA lesion, 40% stenosed. Ost 2nd Mrg to 2nd Mrg lesion, 90% stenosed. There is a 0% residual stenosis post intervention. A drug-eluting stent was placed. Prox Cx lesion, 75% stenosed. There is a 0% residual stenosis post intervention.   1. Continued occlusion of the right posterior AV groove artery with left to right collaterals. Severe mid left circumflex stenosis into OM 2. 2. Mildly elevated left ventricular end-diastolic pressure. 3. Successful angioplasty and drug-eluting stent placement to the mid left circumflex extending into OM 2. I used one long stent which covered both lesions.   Recommendations: Dual antiplatelets therapy for at least one year. Aggressive treatment of risk factors.   Intervention  Implants    Myoview 04/01/17: Study Result  Narrative & Impression  Blood pressure demonstrated a normal response to exercise. Duke treadmill score of 9 consistent with low risk for major cardiac events There was no ST segment deviation noted during stress. Findings consistent with prior inferior myocardial infarction with very mild peri-infarct ischemia. This is a low risk study. Particularly when considering good functional capacity and low risk Duke treadmill  score. The left ventricular ejection fraction is normal (55-65%).     Echo 05/22/21: IMPRESSIONS     1. Left ventricular ejection fraction, by estimation, is 60 to 65%. The  left ventricle has normal function. The left ventricle has no regional  wall motion abnormalities. There is mild left ventricular hypertrophy.  Left ventricular diastolic parameters  are consistent with Grade I diastolic dysfunction (impaired relaxation).   2. Right ventricular systolic function is normal. The right ventricular  size is normal.   3. The mitral valve  is normal in structure. No evidence of mitral valve  regurgitation. No evidence of mitral stenosis.   4. The aortic valve has an indeterminant number of cusps. Aortic valve  regurgitation is mild.   Comparison(s): Previous Echo showed LV EF 55-60%, no RWMA, mild LVH.   CTO PCI 09/10/21: Procedures  CORONARY CTO INTERVENTION  Intravascular Ultrasound/IVUS   Conclusion      Acute Mrg lesion is 95% stenosed.   Dist RCA lesion is 100% stenosed.   Non-stenotic 2nd Mrg lesion was previously treated.   A drug-eluting stent was successfully placed using a SYNERGY XD [id number].   Post intervention, there is a 0% residual stenosis.   Successful CTO PCI of the distal RCA with IVUS guidance and DES x 1 Unsuccessful PCI of the RV marginal branch due to acute angled take off and inability to cross with a wire.    Plan: DAPT for one year. Anticipate DC tomorrow.   Diagnostic Dominance: Right Intervention   ASSESSMENT AND PLAN:  1.  CAD  Now s/p successful CTO PCI of the distal RCA with marked improvement in symptoms. Now class 1. Continue DAPT for at least one year. Continue other therapy.  2. Hypercholesterolemia. Now on Repatha. Having some joint pains likely related to Crestor. Will reduce Crestor dose to 10 mg daily. Recheck lab in 3 months.   3. HTN controlled.   Disposition:  follow up in 6 months.  Signed, Nora Rooke [place], MD  06/21/2022 10:21  AM    [facility] [address], [place], [place], [address] Phone [phone], Fax [phone]

## 2022-06-24 DIAGNOSIS — G4733 Obstructive sleep apnea (adult) (pediatric): Secondary | ICD-10-CM | POA: Diagnosis not present

## 2022-06-25 ENCOUNTER — Ambulatory Visit: Payer: BC Managed Care – PPO | Admitting: Cardiology

## 2022-06-28 DIAGNOSIS — G4733 Obstructive sleep apnea (adult) (pediatric): Secondary | ICD-10-CM | POA: Diagnosis not present

## 2022-06-29 DIAGNOSIS — E291 Testicular hypofunction: Secondary | ICD-10-CM | POA: Diagnosis not present

## 2022-06-29 DIAGNOSIS — R030 Elevated blood-pressure reading, without diagnosis of hypertension: Secondary | ICD-10-CM | POA: Diagnosis not present

## 2022-07-08 ENCOUNTER — Telehealth: Payer: Self-pay | Admitting: Cardiology

## 2022-07-08 ENCOUNTER — Other Ambulatory Visit: Payer: Self-pay

## 2022-07-08 ENCOUNTER — Encounter (HOSPITAL_COMMUNITY): Payer: Self-pay

## 2022-07-08 ENCOUNTER — Observation Stay (HOSPITAL_COMMUNITY)
Admission: EM | Admit: 2022-07-08 | Discharge: 2022-07-11 | Disposition: A | Discharge status: Home / Self Care | Payer: BC Managed Care – PPO | Attending: Cardiology | Admitting: Cardiology

## 2022-07-08 ENCOUNTER — Emergency Department (HOSPITAL_COMMUNITY): Payer: BC Managed Care – PPO

## 2022-07-08 DIAGNOSIS — F17290 Nicotine dependence, other tobacco product, uncomplicated: Secondary | ICD-10-CM | POA: Insufficient documentation

## 2022-07-08 DIAGNOSIS — Z7902 Long term (current) use of antithrombotics/antiplatelets: Secondary | ICD-10-CM | POA: Diagnosis not present

## 2022-07-08 DIAGNOSIS — Z7982 Long term (current) use of aspirin: Secondary | ICD-10-CM | POA: Insufficient documentation

## 2022-07-08 DIAGNOSIS — F17210 Nicotine dependence, cigarettes, uncomplicated: Secondary | ICD-10-CM | POA: Diagnosis not present

## 2022-07-08 DIAGNOSIS — R001 Bradycardia, unspecified: Secondary | ICD-10-CM | POA: Insufficient documentation

## 2022-07-08 DIAGNOSIS — E785 Hyperlipidemia, unspecified: Secondary | ICD-10-CM | POA: Diagnosis not present

## 2022-07-08 DIAGNOSIS — I200 Unstable angina: Secondary | ICD-10-CM

## 2022-07-08 DIAGNOSIS — R079 Chest pain, unspecified: Secondary | ICD-10-CM | POA: Diagnosis not present

## 2022-07-08 DIAGNOSIS — Z79899 Other long term (current) drug therapy: Secondary | ICD-10-CM | POA: Insufficient documentation

## 2022-07-08 DIAGNOSIS — I10 Essential (primary) hypertension: Secondary | ICD-10-CM | POA: Diagnosis not present

## 2022-07-08 DIAGNOSIS — Z20822 Contact with and (suspected) exposure to covid-19: Secondary | ICD-10-CM | POA: Diagnosis not present

## 2022-07-08 DIAGNOSIS — R0602 Shortness of breath: Secondary | ICD-10-CM | POA: Diagnosis not present

## 2022-07-08 DIAGNOSIS — I2510 Atherosclerotic heart disease of native coronary artery without angina pectoris: Secondary | ICD-10-CM | POA: Diagnosis present

## 2022-07-08 DIAGNOSIS — I25110 Atherosclerotic heart disease of native coronary artery with unstable angina pectoris: Secondary | ICD-10-CM | POA: Insufficient documentation

## 2022-07-08 LAB — HEPARIN LEVEL (UNFRACTIONATED): Heparin Unfractionated: 0.27 IU/mL — ABNORMAL LOW (ref 0.30–0.70)

## 2022-07-08 LAB — TROPONIN I (HIGH SENSITIVITY)
Troponin I (High Sensitivity): 11 ng/L (ref <18)
Troponin I (High Sensitivity): 11 ng/L (ref <18)

## 2022-07-08 LAB — BASIC METABOLIC PANEL
Anion gap: 5 (ref 5–15)
BUN: 12 mg/dL (ref 6–20)
CO2: 25 mmol/L (ref 22–32)
Calcium: 8.9 mg/dL (ref 8.9–10.3)
Chloride: 109 mmol/L (ref 98–111)
Creatinine, Ser: 0.97 mg/dL (ref 0.61–1.24)
GFR, Estimated: >60 mL/min (ref >60)
Glucose, Bld: 102 mg/dL — ABNORMAL HIGH (ref 70–99)
Potassium: 4 mmol/L (ref 3.5–5.1)
Sodium: 139 mmol/L (ref 135–145)

## 2022-07-08 LAB — CBC
HCT: 49.4 % (ref 39.0–52.0)
Hemoglobin: 16.6 g/dL (ref 13.0–17.0)
MCH: 30.9 pg (ref 26.0–34.0)
MCHC: 33.6 g/dL (ref 30.0–36.0)
MCV: 91.8 fL (ref 80.0–100.0)
Platelets: 323 10*3/uL (ref 150–400)
RBC: 5.38 MIL/uL (ref 4.22–5.81)
RDW: 13.7 % (ref 11.5–15.5)
WBC: 8.5 10*3/uL (ref 4.0–10.5)
nRBC: 0 % (ref 0.0–0.2)

## 2022-07-08 MED ORDER — ACETAMINOPHEN 325 MG PO TABS
650.0000 mg | ORAL_TABLET | ORAL | Status: DC | PRN
  Filled 2022-07-08: qty 2

## 2022-07-08 MED ORDER — MORPHINE SULFATE (PF) 4 MG/ML IV SOLN
2.0000 mg | Freq: Once | INTRAVENOUS | Status: AC
  Administered 2022-07-08: 2 mg via INTRAVENOUS
  Filled 2022-07-08: qty 1

## 2022-07-08 MED ORDER — ROSUVASTATIN CALCIUM 5 MG PO TABS
10.0000 mg | ORAL_TABLET | Freq: Every day | ORAL | Status: DC
  Administered 2022-07-09 – 2022-07-11 (×3): 10 mg via ORAL
  Filled 2022-07-08 (×3): qty 2

## 2022-07-08 MED ORDER — AMLODIPINE BESYLATE 5 MG PO TABS
5.0000 mg | ORAL_TABLET | Freq: Every day | ORAL | Status: DC
  Administered 2022-07-09 – 2022-07-11 (×3): 5 mg via ORAL
  Filled 2022-07-08 (×3): qty 1

## 2022-07-08 MED ORDER — SODIUM CHLORIDE 0.9% FLUSH
3.0000 mL | Freq: Two times a day (BID) | INTRAVENOUS | Status: DC
  Administered 2022-07-09 – 2022-07-10 (×2): 3 mL via INTRAVENOUS

## 2022-07-08 MED ORDER — SODIUM CHLORIDE 0.9 % IV SOLN: 250.0000 mL | INTRAVENOUS | Status: DC | PRN

## 2022-07-08 MED ORDER — LISINOPRIL 5 MG PO TABS
5.0000 mg | ORAL_TABLET | Freq: Every day | ORAL | Status: DC
  Administered 2022-07-09 – 2022-07-11 (×3): 5 mg via ORAL
  Filled 2022-07-08 (×3): qty 1

## 2022-07-08 MED ORDER — SODIUM CHLORIDE 0.9% FLUSH: 3.0000 mL | INTRAVENOUS | Status: DC | PRN

## 2022-07-08 MED ORDER — SODIUM CHLORIDE 0.9% FLUSH
3.0000 mL | Freq: Two times a day (BID) | INTRAVENOUS | Status: DC
  Administered 2022-07-10: 3 mL via INTRAVENOUS

## 2022-07-08 MED ORDER — HEPARIN (PORCINE) 25000 UT/250ML-% IV SOLN
1700.0000 [IU]/h | INTRAVENOUS | Status: DC
  Administered 2022-07-08: 1400 [IU]/h via INTRAVENOUS
  Administered 2022-07-09: 1600 [IU]/h via INTRAVENOUS
  Filled 2022-07-08 (×2): qty 250

## 2022-07-08 MED ORDER — ASPIRIN 81 MG PO TBEC
81.0000 mg | DELAYED_RELEASE_TABLET | Freq: Every day | ORAL | Status: DC
  Administered 2022-07-10 – 2022-07-11 (×2): 81 mg via ORAL
  Filled 2022-07-08 (×2): qty 1

## 2022-07-08 MED ORDER — SODIUM CHLORIDE 0.9 % WEIGHT BASED INFUSION
1.0000 mL/kg/h | INTRAVENOUS | Status: DC
  Administered 2022-07-09: 1 mL/kg/h via INTRAVENOUS

## 2022-07-08 MED ORDER — ONDANSETRON HCL 4 MG/2ML IJ SOLN
4.0000 mg | Freq: Four times a day (QID) | INTRAMUSCULAR | Status: DC | PRN
  Administered 2022-07-08: 4 mg via INTRAVENOUS
  Filled 2022-07-08: qty 2

## 2022-07-08 MED ORDER — ASPIRIN 81 MG PO CHEW
81.0000 mg | CHEWABLE_TABLET | ORAL | Status: AC
  Administered 2022-07-09: 81 mg via ORAL
  Filled 2022-07-08: qty 1

## 2022-07-08 MED ORDER — HEPARIN BOLUS VIA INFUSION
4000.0000 [IU] | Freq: Once | INTRAVENOUS | Status: AC
  Administered 2022-07-08: 4000 [IU] via INTRAVENOUS

## 2022-07-08 MED ORDER — PANTOPRAZOLE SODIUM 40 MG PO TBEC
40.0000 mg | DELAYED_RELEASE_TABLET | Freq: Every day | ORAL | Status: DC
  Administered 2022-07-09 – 2022-07-11 (×3): 40 mg via ORAL
  Filled 2022-07-08 (×3): qty 1

## 2022-07-08 MED ORDER — CLOPIDOGREL BISULFATE 75 MG PO TABS
75.0000 mg | ORAL_TABLET | Freq: Every day | ORAL | Status: DC
  Administered 2022-07-09 – 2022-07-11 (×3): 75 mg via ORAL
  Filled 2022-07-08 (×3): qty 1

## 2022-07-08 MED ORDER — ASPIRIN 300 MG RE SUPP: 300.0000 mg | RECTAL | Status: DC

## 2022-07-08 MED ORDER — NITROGLYCERIN 0.4 MG SL SUBL
0.4000 mg | SUBLINGUAL_TABLET | SUBLINGUAL | Status: DC | PRN
  Administered 2022-07-08 (×4): 0.4 mg via SUBLINGUAL
  Filled 2022-07-08 (×4): qty 1

## 2022-07-08 MED ORDER — MORPHINE SULFATE (PF) 2 MG/ML IV SOLN
2.0000 mg | Freq: Once | INTRAVENOUS | Status: AC
  Administered 2022-07-08: 2 mg via INTRAVENOUS
  Filled 2022-07-08: qty 1

## 2022-07-08 MED ORDER — SODIUM CHLORIDE 0.9 % WEIGHT BASED INFUSION
3.0000 mL/kg/h | INTRAVENOUS | Status: DC
  Administered 2022-07-09: 3 mL/kg/h via INTRAVENOUS

## 2022-07-08 MED ORDER — METOPROLOL TARTRATE 50 MG PO TABS
50.0000 mg | ORAL_TABLET | Freq: Two times a day (BID) | ORAL | Status: DC
  Administered 2022-07-08 – 2022-07-10 (×5): 50 mg via ORAL
  Filled 2022-07-08 (×6): qty 1

## 2022-07-08 NOTE — ED Provider Notes (Signed)
[facility] EMERGENCY DEPARTMENT Provider Note   CSN: [id number] Arrival date & time: 07/08/22  1113     History  Chief Complaint  Patient presents with   Chest Pain    Alex Barnes is a 51 y.o. male with past medical history significant for obesity, CAD, previous NSTEMI with stents in place, previous TIA, hyperlipidemia who presents with concern for worsening chest pain, shortness of breath both at rest and with exertion over the last few weeks, worsened over the last few days.  Patient reports a discrete event around 2 to 3 weeks ago where he was helping his daughter move back into her college dorm and had a severe episode of chest pain/pressure which resolved after a couple of days of rest.  Patient reports that over the last 4 to 5 days he has had chest pressure, shortness of breath, worsening with exertion, radiating into the contralateral side of the chest, and into left shoulder intermittently.  Denies nausea or vomiting, endorses diaphoresis.  Patient has been taking his aspirin, Plavix, blood pressure medication, and cholesterol medication as prescribed.  He denies any feeling of lightheadedness, dizziness, double vision.  He rates his chest pressure/discomfort 7-8/10 at this time.   Chest Pain Associated symptoms: shortness of breath        Home Medications Prior to Admission medications   Medication Sig Start Date End Date Taking? Authorizing Provider  amLODipine (NORVASC) 5 MG tablet Take 1 tablet (5 mg total) by mouth daily. 08/28/21  Yes Swaziland, Peter M, MD  aspirin 81 MG chewable tablet Chew 1 tablet (81 mg total) by mouth daily. 08/01/15  Yes Azalee Course, PA  clopidogrel (PLAVIX) 75 MG tablet Take 1 tablet (75 mg total) by mouth daily. 08/29/21 08/29/22 Yes [place], Peter M, MD  Evolocumab (REPATHA SURECLICK) 140 MG/ML SOAJ Inject 140 mg into the skin every 14 (fourteen) days. 10/16/21  Yes Branch, Dorothe Pea, MD  lisinopril (ZESTRIL) 2.5 MG tablet TAKE 2 TABLETS BY MOUTH  EVERY DAY Patient taking differently: Take 5 mg by mouth daily. 05/20/22  Yes Branch, Dorothe Pea, MD  metoprolol tartrate (LOPRESSOR) 50 MG tablet TAKE 1 TABLET BY MOUTH TWICE A DAY Patient taking differently: Take 50 mg by mouth 2 (two) times daily. 06/17/22  Yes [place]az[place], Peter M, MD  pantoprazole (PROTONIX) 40 MG tablet Take 40 mg by mouth daily.   Yes [provider]  rosuvastatin (CRESTOR) 10 MG tablet Take 1 tablet (10 mg total) by mouth daily. 05/18/22 05/13/23 Yes [place]az[place], Peter M, MD  testosterone cypionate (DEPOTESTOSTERONE CYPIONATE) 200 MG/ML injection Inject 200 mg into the muscle every 14 (fourteen) days. 11/26/18  Yes [provider]  nitroGLYCERIN (NITROSTAT) 0.4 MG SL tablet Place 1 tablet (0.4 mg total) under the tongue every 5 (five) minutes as needed for chest pain. 03/11/21   Netta Neat., NP      Allergies    Patient has no known allergies.    Review of Systems   Review of Systems  Respiratory:  Positive for chest tightness and shortness of breath.   Cardiovascular:  Positive for chest pain.  All other systems reviewed and are negative.   Physical Exam Updated Vital Signs BP 135/67   Pulse (!) 50   Temp 98.1 F (36.7 C) (Oral)   Resp 16   Ht 5\' 10"  (1.778 m)   Wt 131.5 kg   SpO2 98%   BMI 41.61 kg/m  Physical Exam Vitals and nursing note reviewed.  Constitutional:  General: He is not in acute distress.    Appearance: Normal appearance.  HENT:     Head: Normocephalic and atraumatic.  Eyes:     General:        Right eye: No discharge.        Left eye: No discharge.  Cardiovascular:     Rate and Rhythm: Regular rhythm. Bradycardia present.     Heart sounds: No murmur heard.    No friction rub. No gallop.  Pulmonary:     Effort: Pulmonary effort is normal.     Breath sounds: Normal breath sounds.  Chest:     Comments: No significant tenderness to palpation of chest wall Abdominal:     General: Bowel sounds are normal.      Palpations: Abdomen is soft.  Skin:    General: Skin is warm and dry.     Capillary Refill: Capillary refill takes less than 2 seconds.  Neurological:     Mental Status: He is alert and oriented to person, place, and time.  Psychiatric:        Mood and Affect: Mood normal.        Behavior: Behavior normal.     ED Results / Procedures / Treatments   Labs (all labs ordered are listed, but only abnormal results are displayed) Labs Reviewed  BASIC METABOLIC PANEL - Abnormal; Notable for the following components:      Result Value   Glucose, Bld 102 (*)    All other components within normal limits  CBC  HEPARIN LEVEL (UNFRACTIONATED)  TROPONIN I (HIGH SENSITIVITY)  TROPONIN I (HIGH SENSITIVITY)    EKG EKG Interpretation  Date/Time:  Wednesday July 08 2022 11:23:57 EDT Ventricular Rate:  65 PR Interval:  172 QRS Duration: 94 QT Interval:  428 QTC Calculation: 445 R Axis:   45 Text Interpretation: Sinus rhythm with sinus arrhythmia with occasional PVCs Confirmed by Gloris Manchester ([id number]) on 07/08/2022 12:40:26 PM  Radiology DG Chest 2 View  Result Date: 07/08/2022 CLINICAL DATA:  pt states that he is here for some chest pain, states that he is also having some SOB EXAM: CHEST - 2 VIEW COMPARISON:  Chest radiograph 07/04/2017 FINDINGS: The cardiomediastinal contours are within normal limits. The lungs are clear. No pneumothorax or pleural effusion. No acute finding in the visualized skeleton. IMPRESSION: No acute cardiopulmonary process. Electronically Signed   By: Emmaline Kluver M.D.   On: 07/08/2022 11:48    Procedures Procedures    Medications Ordered in ED Medications  nitroGLYCERIN (NITROSTAT) SL tablet 0.4 mg (0.4 mg Sublingual Given 07/08/22 1204)  ondansetron (ZOFRAN) injection 4 mg (4 mg Intravenous Given 07/08/22 1204)  heparin bolus via infusion 4,000 Units (4,000 Units Intravenous Bolus from Bag 07/08/22 1443)    Followed by  heparin ADULT infusion 100 units/mL  (25000 units/245mL) (1,400 Units/hr Intravenous New Bag/Given 07/08/22 1444)  morphine (PF) 2 MG/ML injection 2 mg (2 mg Intravenous Patient Refused/Not Given 07/08/22 1522)  morphine (PF) 4 MG/ML injection 2 mg (2 mg Intravenous Given 07/08/22 1203)    ED Course/ Medical Decision Making/ A&P                           Medical Decision Making Amount and/or Complexity of Data Reviewed Labs: ordered. Radiology: ordered.  Risk Prescription drug management.   This patient is a 51 y.o. male who presents to the ED for concern of chest pain, this involves an extensive number of  treatment options, and is a complaint that carries with it a high risk of complications and morbidity. The emergent differential diagnosis prior to evaluation includes, but is not limited to,  ACS, AAS, PE, Mallory-Weiss, Boerhaave's, Pneumonia, acute bronchitis, asthma or COPD exacerbation, anxiety, MSK pain or traumatic injury to the chest, acid reflux versus other .   This is not an exhaustive differential.   Past Medical History / Co-morbidities / Social History: Obesity, HTN, HLD, NSTEMI with stents, CAD, TIA  Additional history: Chart reviewed. Pertinent results include: Reviewed recent previous cardiac procedures, most recent echo and cath were in October of last year  Physical Exam: Physical exam performed. The pertinent findings include: Patient in no acute distress, no tenderness palpation of chest wall on my exam.  He has had some bradycardia during his visit which he has had previously, with intermittent PVCs on EKG.  He otherwise has stable vital signs, stable oxygen saturation on room air, and on arrival he was somewhat hypertensive with blood pressure 141/111 but this is since resolved, most recent 135/67.  Lab Tests: I ordered, and personally interpreted labs.  The pertinent results include: CBC without abnormality, BMP overall unremarkable, mild hyperglycemia and nonfasting lab values, glucose 102.  Troponin  is negative x2, flat without delta, 11 and then 11.   Imaging Studies: I ordered imaging studies including plain film chest x-ray. I independently visualized and interpreted imaging which showed no acute intrathoracic abnormality. I agree with the radiologist interpretation.   Cardiac Monitoring:  The patient was maintained on a cardiac monitor.  My attending physician Dr. Durwin Nora viewed and interpreted the cardiac monitored which showed an underlying rhythm of: NSR with PVCs I agree with this interpretation.   Medications: I ordered medication including nitroglycerin, morphine, heparin for active chest pain, and unstable angina work-up.  Patient reports improvement of pain after nitroglycerin and morphine, still endorsing some ongoing pain at rest.  Consultations Obtained: I requested consultation with the cardiologist, spoke with Dr. Wyline Mood, as well as hospitalist, spoke with Dr. Sherryll Burger,  and discussed lab and imaging findings as well as pertinent plan - they recommend: Admission for unstable angina work-up, initially had wanted to admit to the hospital team at [facility], however after evaluation Dr. Wyline Mood recommends transfer to [facility]  Clinically patient with presentation consistent with unstable angina.  he has previous NSTEMI, stents, documented multivessel coronary artery disease, and story with exertional chest pain, shortness of breath, now present at rest consistent with unstable angina.   Disposition: After consideration of the diagnostic results and the patients response to treatment, I feel that patient would benefit from admission as discussed above .   I discussed this case with my attending physician Dr. Durwin Nora who cosigned this note including patient's presenting symptoms, physical exam, and planned diagnostics and interventions. Attending physician stated agreement with plan or made changes to plan which were implemented.    Final Clinical Impression(s) / ED Diagnoses Final  diagnoses:  Unstable angina [facility])    Rx / DC Orders ED Discharge Orders     None         [place] 07/08/22 1530    Gloris Manchester, MD 07/08/22 1715

## 2022-07-08 NOTE — ED Notes (Signed)
[facility] arrived to transport pt 

## 2022-07-08 NOTE — Telephone Encounter (Signed)
Patient has appointment scheduled on 9/26 and wants to know if he will need to do lab work prior to his appointment.

## 2022-07-08 NOTE — Progress Notes (Addendum)
Admit orders written per d/w Dr. Wyline Mood. Called cath lab to post for cath tomorrow. Spoke with cardmaster to notify of patient's need to transfer to [facility] for cath tomorrow. Nurse helped perform med rec and confirmed patient already took home meds today including ASA/Plavix. Pre-cath orders are under sign/held including NPO after midnight except sips with meds.

## 2022-07-08 NOTE — H&P (Signed)
Cardiology Admission History and Physical   Patient ID: Alex Barnes MRN: [id number]; DOB: 1971/09/02   Admission date: 07/08/2022  PCP:  Richardean Chimera, MD   [facility] Providers Cardiologist:  Dina Rich, MD   {     Chief Complaint:  Chest pain  Patient Profile:   Alex Barnes is a 51 y.o. male with CAD, HTN, HL  who is being seen 07/08/2022 for the evaluation of chest pain.  History of Present Illness:   Mr. Alex Barnes 51 yo male extensive history of CAD with NSTEMI 07/2015 received DES to LCX and OM2, RCA CTO managed medically at that time. 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 Chronic angina from RCA CTO failed medical therapy, referred to Dr Swaziland and had PCI to RCA CTO 09/2021, unsuccesful PCI or RV marginal Charon Akamine due to acute angled take off and inability to cross wire. Following this procedure symptoms much improved based on 12/2021 clinic note. History of HL with limited statin tolerance on repatha and crestor followed in lipid clinic, HTN presents with chest pain  Reports symptoms started about 1 month ago. Pressure/tightness mid to left chest up to 8/10 in severity, lasts a fwe minutes but a dull pain can last throughout the day. Associated SOB, diaphoresis, occasionally palpitaitons. Can occur at rest or with activity but more severe with activity. Worst episode was with moving his daughter into her dorm. Over the weeks has been increasing in severity, stable frequency.   K 4 BUN 12 Cr 0.97 WBC 8.5 Hgb 16.6 Plt 323  Trop 11-->11 EKG SR, PVCs, no acute ischemic changes   05/2021 echo: LVEF 60-65%, no WMAs, grade I dd, normal RV 08/2021 cath: LM normal, LAD mild diffuse disease, ramus normal, LCX OM2 patent stent, RCA acute marginal 95%, RPAV 100%.     Past Medical History:  Diagnosis Date   CAD (coronary artery disease)    cath 9/26 & 07/31/2015 100% total occlusion of post atrio Maia Handa of RCA, LV supplied by collaterals from the L atrial  recurrent Kaylee Wombles of distal LCx, 75-80% mid to distal LCx stenosis, high grade complex stenosis of OM2, patent LAD and RI, EF 55-60%. On 9/8, single DES to midLCx extending into OM2.   Headache    "weekly" (07/29/2015)   Hypercholesteremia    NSTEMI (non-ST elevated myocardial infarction) (HCC) 07/28/2015    Past Surgical History:  Procedure Laterality Date   CARDIAC CATHETERIZATION  2006; 07/29/2015   CARDIAC CATHETERIZATION N/A 07/29/2015   Procedure: Left Heart Cath and Coronary Angiography;  Surgeon: Lyn Records, MD;  Location: [facility] INVASIVE CV LAB;  Service: Cardiovascular;  Laterality: N/A;   CARDIAC CATHETERIZATION N/A 07/31/2015   Procedure: Coronary Stent Intervention;  Surgeon: Iran Ouch, MD;  Location: [facility] INVASIVE CV LAB;  Service: Cardiovascular;  Laterality: N/A;   CARDIAC CATHETERIZATION N/A 07/31/2015   Procedure: Left Heart Cath and Coronary Angiography;  Surgeon: Iran Ouch, MD;  Location: [facility] INVASIVE CV LAB;  Service: Cardiovascular;  Laterality: N/A;   CHOLECYSTECTOMY     CORONARY CTO INTERVENTION N/A 09/10/2021   Procedure: CORONARY CTO INTERVENTION;  Surgeon: Swaziland, Peter M, MD;  Location: [facility] INVASIVE CV LAB;  Service: Cardiovascular;  Laterality: N/A;   INTRAVASCULAR ULTRASOUND/IVUS N/A 09/10/2021   Procedure: Intravascular Ultrasound/IVUS;  Surgeon: Swaziland, Peter M, MD;  Location: [facility] INVASIVE CV LAB;  Service: Cardiovascular;  Laterality: N/A;   LEFT HEART CATH AND CORONARY ANGIOGRAPHY N/A 08/29/2021   Procedure: LEFT HEART CATH AND  CORONARY ANGIOGRAPHY;  Surgeon: Swaziland, Peter M, MD;  Location[facility] INVASIVE CV LAB;  Service: Cardiovascular;  Laterality: N/A;   TONSILLECTOMY  ~ 1989   WISDOM TOOTH EXTRACTION  1993     Medications Prior to Admission: Prior to Admission medications   Medication Sig Start Date End Date Taking? Authorizing Provider  amLODipine (NORVASC) 5 MG tablet Take 1 tablet (5 mg total) by mouth daily. 08/28/21   [place], Peter M, MD  aspirin  81 MG chewable tablet Chew 1 tablet (81 mg total) by mouth daily. 08/01/15   Azalee Course, PA  clopidogrel (PLAVIX) 75 MG tablet Take 1 tablet (75 mg total) by mouth daily. 08/29/21 08/29/22  [place], Peter M, MD  Evolocumab (REPATHA SURECLICK) 140 MG/ML SOAJ Inject 140 mg into the skin every 14 (fourteen) days. 10/16/21   Antoine Poche, MD  ezetimibe (ZETIA) 10 MG tablet Take 10 mg by mouth daily. 03/07/21   [provider]  lisinopril (ZESTRIL) 2.5 MG tablet TAKE 2 TABLETS BY MOUTH EVERY DAY 05/20/22   Antoine Poche, MD  metoprolol tartrate (LOPRESSOR) 50 MG tablet TAKE 1 TABLET BY MOUTH TWICE A DAY 06/17/22   [place], Peter M, MD  nitroGLYCERIN (NITROSTAT) 0.4 MG SL tablet Place 1 tablet (0.4 mg total) under the tongue every 5 (five) minutes as needed for chest pain. 03/11/21   Netta Neat., NP  pantoprazole (PROTONIX) 40 MG tablet Take 40 mg by mouth daily.    [provider]  rosuvastatin (CRESTOR) 10 MG tablet Take 1 tablet (10 mg total) by mouth daily. 05/18/22 05/13/23  [place], Peter M, MD  testosterone cypionate (DEPOTESTOSTERONE CYPIONATE) 200 MG/ML injection Inject 200 mg into the muscle every 14 (fourteen) days. 11/26/18   [provider]     Allergies:   No Known Allergies  Social History:   Social History   Socioeconomic History   Marital status: Married    Spouse name: Not on file   Number of children: Not on file   Years of education: Not on file   Highest education level: Not on file  Occupational History   Not on file  Tobacco Use   Smoking status: Some Days    Packs/day: 0.50    Years: 10.00    Total pack years: 5.00    Types: Cigarettes, Cigars    Last attempt to quit: 03/08/2020    Years since quitting: 2.3   Smokeless tobacco: Former   Tobacco comments:    "quit smoking cigarettes in 1998"  Vaping Use   Vaping Use: Never used  Substance and Sexual Activity   Alcohol use: Yes    Alcohol/week: 0.0 standard drinks of alcohol     Comment: 6 drinks weekly    Drug use: No   Sexual activity: Yes  Other Topics Concern   Not on file  Social History Narrative   Not on file   Social Determinants of Health   Financial Resource Strain: Not on file  Food Insecurity: Not on file  Transportation Needs: Not on file  Physical Activity: Not on file  Stress: Not on file  Social Connections: Not on file  Intimate Partner Violence: Not on file    Family History:   The patient's family history includes Cancer in his father.    ROS:  Please see the history of present illness.  All other ROS reviewed and negative.     Physical Exam/Data:   Vitals:   07/08/22 1230 07/08/22 1300 07/08/22 1330  07/08/22 1400  BP: 118/65 113/69 123/67 135/67  Pulse: 62 (!) 47 (!) 44 (!) 50  Resp: 14 17 14 16   Temp:      TempSrc:      SpO2: 96% 96% 97% 98%  Weight:      Height:       No intake or output data in the 24 hours ending 07/08/22 1440    07/08/2022   11:19 AM 12/30/2021    1:51 PM 10/01/2021    2:38 PM  Last 3 Weights  Weight (lbs) 290 lb 286 lb 3.2 oz 281 lb 3.2 oz  Weight (kg) 131.543 kg 129.819 kg 127.551 kg     Body mass index is 41.61 kg/m.  General:  Well nourished, well developed, in no acute distress HEENT: normal Neck: no JVD Vascular: No carotid bruits; Distal pulses 2+ bilaterally   Cardiac:  normal S1, S2; RRR; no murmur  Lungs:  clear to auscultation bilaterally, no wheezing, rhonchi or rales  Abd: soft, nontender, no hepatomegaly  Ext: no edema Musculoskeletal:  No deformities, BUE and BLE strength normal and equal Skin: warm and dry  Neuro:  CNs 2-12 intact, no focal abnormalities noted Psych:  Normal affect      Laboratory Data:  High Sensitivity Troponin:   Recent Labs  Lab 07/08/22 1119 07/08/22 1315  TROPONINIHS 11 11      Chemistry Recent Labs  Lab 07/08/22 1119  NA 139  K 4.0  CL 109  CO2 25  GLUCOSE 102*  BUN 12  CREATININE 0.97  CALCIUM 8.9  GFRNONAA >60  ANIONGAP 5     No results for input(s): "PROT", "ALBUMIN", "AST", "ALT", "ALKPHOS", "BILITOT" in the last 168 hours. Lipids No results for input(s): "CHOL", "TRIG", "HDL", "LABVLDL", "LDLCALC", "CHOLHDL" in the last 168 hours. Hematology Recent Labs  Lab 07/08/22 1119  WBC 8.5  RBC 5.38  HGB 16.6  HCT 49.4  MCV 91.8  MCH 30.9  MCHC 33.6  RDW 13.7  PLT 323   Thyroid No results for input(s): "TSH", "FREET4" in the last 168 hours. BNPNo results for input(s): "BNP", "PROBNP" in the last 168 hours.  DDimer No results for input(s): "DDIMER" in the last 168 hours.   Radiology/Studies:  DG Chest 2 View  Result Date: 07/08/2022 CLINICAL DATA:  pt states that he is here for some chest pain, states that he is also having some SOB EXAM: CHEST - 2 VIEW COMPARISON:  Chest radiograph 07/04/2017 FINDINGS: The cardiomediastinal contours are within normal limits. The lungs are clear. No pneumothorax or pleural effusion. No acute finding in the visualized skeleton. IMPRESSION: No acute cardiopulmonary process. Electronically Signed   By: 09/03/2017 M.D.   On: 07/08/2022 11:48     Assessment and Plan:   1.CAD - admit 07/2015 with NSTEMI. Found to have totally occluded RCA, as well as significant LCX and OM2 disease. S/p DES to LCX and OM2, RCA occlusion managed medically. - Chronic angina from RCA CTO failed medical therapy, referred to Dr 08/2015 and had PCI to RCA CTO 09/2021, unsuccesful PCI or RV marginal Clement Deneault due to acute angled take off and inability to cross wire - following 09/2021 PCI much improved angina, was doing well 12/2021 visit with Dr 12/2021.    - presents with symptoms concerning for unstable angina - trops neg thus far, EKG SR PVCs, no acute ischemia - given his CAD history, progression exertional symptoms would plan for cath for definitive evaluation - will arrange transfer to  [place] today, [facility] tomorrow - repeat echo - continue home medication regimen with ASA 81, plavix 75,  lisinopril 2.5, zetia 10, crestor 10, start hep gtt   2. Hyperlipidemia - continue home regimen crestor, zetia, repatha - followed in lipid clinic   Shared Decision Making/Informed Consent The risks [stroke (1 in 1000), death (1 in 1000), kidney failure [usually temporary] (1 in 500), bleeding (1 in 200), allergic reaction [possibly serious] (1 in 200)], benefits (diagnostic support and management of coronary artery disease) and alternatives of a cardiac catheterization were discussed in detail with Mr. Severin and he is willing to proceed.     Risk Assessment/Risk Scores:  }  TIMI Risk Score for Unstable Angina or Non-ST Elevation MI:   The patient's TIMI risk score is 4, which indicates a 20% risk of all cause mortality, new or recurrent myocardial infarction or need for urgent revascularization in the next 14 days.{     For questions or updates, please contact [facility] Please consult [email or web address] for contact info under     Signed, Dina Rich, MD  07/08/2022 2:40 PM

## 2022-07-08 NOTE — Telephone Encounter (Signed)
Detailed  message left for pt to come in one morning a couple of days prior to 9/26 for fasting lipid ,liver ,and bmet Orders are in system ./cy

## 2022-07-08 NOTE — ED Triage Notes (Signed)
Pt to er, pt states that he is here for some chest pain, states that he is also having some sob, states that he has a hx of heart attack with two stents.  Pt states that he has had the pain for the past few days.

## 2022-07-08 NOTE — Progress Notes (Signed)
ANTICOAGULATION CONSULT NOTE - Initial Consult  Pharmacy Consult for Heparin Indication: unstable angina  No Known Allergies  Patient Measurements: Height: 5\' 10"  (177.8 cm) Weight: 131.5 kg (290 lb) IBW/kg (Calculated) : 73 HEPARIN DW (KG): 103.3   Vital Signs: Temp: 98.1 F (36.7 C) (09/06 1121) Temp Source: Oral (09/06 1121) BP: 123/67 (09/06 1330) Pulse Rate: 44 (09/06 1330)  Labs: Recent Labs    07/08/22 1119 07/08/22 1315  HGB 16.6  --   HCT 49.4  --   PLT 323  --   CREATININE 0.97  --   TROPONINIHS 11 11    Estimated Creatinine Clearance: 125.6 mL/min (by C-G formula based on SCr of 0.97 mg/dL).   Medical History: Past Medical History:  Diagnosis Date   CAD (coronary artery disease)    cath 9/26 & 07/31/2015 100% total occlusion of post atrio branch of RCA, LV supplied by collaterals from the L atrial recurrent branch of distal LCx, 75-80% mid to distal LCx stenosis, high grade complex stenosis of OM2, patent LAD and RI, EF 55-60%. On 9/8, single DES to midLCx extending into OM2.   Headache    "weekly" (07/29/2015)   Hypercholesteremia    NSTEMI (non-ST elevated myocardial infarction) (HCC) 07/28/2015    Medications:  See med rec  Assessment: Pt presents to ED with some chest pain, and  some sob. He has hx of heart attack with two stents.  Pt states that he has had the pain for the past few days. Patient is not orally anticoagulated with DOAC. Pharmacy asked to start heparin  Goal of Therapy:  Heparin level 0.3-0.7 units/ml Monitor platelets by anticoagulation protocol: Yes   Plan:  Give 4000 units bolus x 1 Start heparin infusion at 1400 units/hr Check anti-Xa level in ~6 hours and daily while on heparin Continue to monitor H&H and platelets  07/30/2015, BS Pharm D, BCPS Clinical Pharmacist 07/08/2022,2:04 PM

## 2022-07-08 NOTE — Progress Notes (Signed)
ANTICOAGULATION CONSULT NOTE - Follow Up Consult  Pharmacy Consult for heparin Indication: chest pain/ACS  Labs: Recent Labs    07/08/22 1119 07/08/22 1315 07/08/22 1724  HGB 16.6  --   --   HCT 49.4  --   --   PLT 323  --   --   HEPARINUNFRC  --   --  0.27*  CREATININE 0.97  --   --   TROPONINIHS 11 11  --     Assessment: 51yo male subtherapeutic on heparin with initial dosing for CP; no infusion issues or signs of bleeding per RN.  Goal of Therapy:  Heparin level 0.3-0.7 units/ml   Plan:  Will increase heparin infusion by 1-2 units/kg/hr to 1600 units/hr and check level in 6 hours.    Vernard Gambles, PharmD, BCPS  07/08/2022,11:26 PM

## 2022-07-09 ENCOUNTER — Observation Stay (HOSPITAL_BASED_OUTPATIENT_CLINIC_OR_DEPARTMENT_OTHER): Payer: BC Managed Care – PPO

## 2022-07-09 ENCOUNTER — Encounter (HOSPITAL_COMMUNITY)
Admission: EM | Disposition: A | Discharge status: Home / Self Care | Payer: Self-pay | Source: Home / Self Care | Attending: Emergency Medicine

## 2022-07-09 ENCOUNTER — Other Ambulatory Visit: Payer: Self-pay

## 2022-07-09 ENCOUNTER — Encounter (HOSPITAL_COMMUNITY): Payer: Self-pay | Admitting: Cardiovascular Disease

## 2022-07-09 DIAGNOSIS — I10 Essential (primary) hypertension: Secondary | ICD-10-CM

## 2022-07-09 DIAGNOSIS — E782 Mixed hyperlipidemia: Secondary | ICD-10-CM | POA: Diagnosis not present

## 2022-07-09 DIAGNOSIS — R001 Bradycardia, unspecified: Secondary | ICD-10-CM | POA: Diagnosis not present

## 2022-07-09 DIAGNOSIS — I2510 Atherosclerotic heart disease of native coronary artery without angina pectoris: Secondary | ICD-10-CM | POA: Diagnosis not present

## 2022-07-09 DIAGNOSIS — I200 Unstable angina: Secondary | ICD-10-CM

## 2022-07-09 DIAGNOSIS — Z7902 Long term (current) use of antithrombotics/antiplatelets: Secondary | ICD-10-CM | POA: Diagnosis not present

## 2022-07-09 DIAGNOSIS — F17290 Nicotine dependence, other tobacco product, uncomplicated: Secondary | ICD-10-CM | POA: Diagnosis not present

## 2022-07-09 DIAGNOSIS — I25119 Atherosclerotic heart disease of native coronary artery with unspecified angina pectoris: Secondary | ICD-10-CM | POA: Diagnosis not present

## 2022-07-09 DIAGNOSIS — Z79899 Other long term (current) drug therapy: Secondary | ICD-10-CM | POA: Diagnosis not present

## 2022-07-09 DIAGNOSIS — F17210 Nicotine dependence, cigarettes, uncomplicated: Secondary | ICD-10-CM | POA: Diagnosis not present

## 2022-07-09 DIAGNOSIS — Z20822 Contact with and (suspected) exposure to covid-19: Secondary | ICD-10-CM | POA: Diagnosis not present

## 2022-07-09 DIAGNOSIS — R079 Chest pain, unspecified: Secondary | ICD-10-CM

## 2022-07-09 DIAGNOSIS — I25110 Atherosclerotic heart disease of native coronary artery with unstable angina pectoris: Secondary | ICD-10-CM | POA: Diagnosis not present

## 2022-07-09 DIAGNOSIS — Z7982 Long term (current) use of aspirin: Secondary | ICD-10-CM | POA: Diagnosis not present

## 2022-07-09 HISTORY — PX: LEFT HEART CATH AND CORONARY ANGIOGRAPHY: CATH118249

## 2022-07-09 LAB — TROPONIN I (HIGH SENSITIVITY): Troponin I (High Sensitivity): 11 ng/L (ref <18)

## 2022-07-09 LAB — ECHOCARDIOGRAM COMPLETE
Area-P 1/2: 3.81 cm2
Calc EF: 59.1 %
Height: 70 in
P 1/2 time: 405 msec
S' Lateral: 3.7 cm
Single Plane A2C EF: 66.1 %
Single Plane A4C EF: 51.8 %
Weight: 4697.6 oz

## 2022-07-09 LAB — COMPREHENSIVE METABOLIC PANEL
ALT: 38 U/L (ref 0–44)
AST: 27 U/L (ref 15–41)
Albumin: 3.5 g/dL (ref 3.5–5.0)
Alkaline Phosphatase: 30 U/L — ABNORMAL LOW (ref 38–126)
Anion gap: 7 (ref 5–15)
BUN: 10 mg/dL (ref 6–20)
CO2: 24 mmol/L (ref 22–32)
Calcium: 8.7 mg/dL — ABNORMAL LOW (ref 8.9–10.3)
Chloride: 106 mmol/L (ref 98–111)
Creatinine, Ser: 0.98 mg/dL (ref 0.61–1.24)
GFR, Estimated: >60 mL/min (ref >60)
Glucose, Bld: 101 mg/dL — ABNORMAL HIGH (ref 70–99)
Potassium: 4.1 mmol/L (ref 3.5–5.1)
Sodium: 137 mmol/L (ref 135–145)
Total Bilirubin: 0.6 mg/dL (ref 0.3–1.2)
Total Protein: 6.4 g/dL — ABNORMAL LOW (ref 6.5–8.1)

## 2022-07-09 LAB — LIPID PANEL
Cholesterol: 93 mg/dL (ref 0–200)
HDL: 22 mg/dL — ABNORMAL LOW (ref >40)
LDL Cholesterol: 44 mg/dL (ref 0–99)
Total CHOL/HDL Ratio: 4.2 RATIO
Triglycerides: 133 mg/dL (ref <150)
VLDL: 27 mg/dL (ref 0–40)

## 2022-07-09 LAB — CBC
HCT: 46.5 % (ref 39.0–52.0)
Hemoglobin: 15.5 g/dL (ref 13.0–17.0)
MCH: 30.8 pg (ref 26.0–34.0)
MCHC: 33.3 g/dL (ref 30.0–36.0)
MCV: 92.4 fL (ref 80.0–100.0)
Platelets: 292 10*3/uL (ref 150–400)
RBC: 5.03 MIL/uL (ref 4.22–5.81)
RDW: 13.7 % (ref 11.5–15.5)
WBC: 7.6 10*3/uL (ref 4.0–10.5)
nRBC: 0 % (ref 0.0–0.2)

## 2022-07-09 LAB — HEPARIN LEVEL (UNFRACTIONATED): Heparin Unfractionated: 0.3 IU/mL (ref 0.30–0.70)

## 2022-07-09 LAB — HIV ANTIBODY (ROUTINE TESTING W REFLEX): HIV Screen 4th Generation wRfx: NONREACTIVE

## 2022-07-09 SURGERY — LEFT HEART CATH AND CORONARY ANGIOGRAPHY
Anesthesia: LOCAL

## 2022-07-09 MED ORDER — LIDOCAINE HCL (PF) 1 % IJ SOLN
INTRAMUSCULAR | Status: DC | PRN
  Administered 2022-07-09: 2 mL

## 2022-07-09 MED ORDER — TRAMADOL HCL 50 MG PO TABS
50.0000 mg | ORAL_TABLET | Freq: Four times a day (QID) | ORAL | Status: DC | PRN
  Administered 2022-07-10: 50 mg via ORAL
  Filled 2022-07-09 (×2): qty 1

## 2022-07-09 MED ORDER — IOHEXOL 350 MG/ML SOLN
INTRAVENOUS | Status: DC | PRN
  Administered 2022-07-09: 45 mL

## 2022-07-09 MED ORDER — HEPARIN (PORCINE) IN NACL 1000-0.9 UT/500ML-% IV SOLN
INTRAVENOUS | Status: AC
  Filled 2022-07-09: qty 500

## 2022-07-09 MED ORDER — HEPARIN SODIUM (PORCINE) 1000 UNIT/ML IJ SOLN
INTRAMUSCULAR | Status: AC
  Filled 2022-07-09: qty 10

## 2022-07-09 MED ORDER — SODIUM CHLORIDE 0.9 % IV SOLN: 250.0000 mL | INTRAVENOUS | Status: DC | PRN

## 2022-07-09 MED ORDER — HYDROCODONE-ACETAMINOPHEN 5-325 MG PO TABS: 1.0000 | ORAL_TABLET | Freq: Once | ORAL | Status: DC

## 2022-07-09 MED ORDER — MIDAZOLAM HCL 2 MG/2ML IJ SOLN
INTRAMUSCULAR | Status: DC | PRN
  Administered 2022-07-09: 2 mg via INTRAVENOUS

## 2022-07-09 MED ORDER — HEPARIN SODIUM (PORCINE) 1000 UNIT/ML IJ SOLN
INTRAMUSCULAR | Status: DC | PRN
  Administered 2022-07-09: 6000 [IU] via INTRAVENOUS

## 2022-07-09 MED ORDER — SODIUM CHLORIDE 0.9% FLUSH
3.0000 mL | Freq: Two times a day (BID) | INTRAVENOUS | Status: DC
  Administered 2022-07-10 – 2022-07-11 (×3): 3 mL via INTRAVENOUS

## 2022-07-09 MED ORDER — PERFLUTREN LIPID MICROSPHERE
1.0000 mL | INTRAVENOUS | Status: AC | PRN
  Administered 2022-07-09: 2.5 mL via INTRAVENOUS

## 2022-07-09 MED ORDER — SODIUM CHLORIDE 0.9% FLUSH: 3.0000 mL | INTRAVENOUS | Status: DC | PRN

## 2022-07-09 MED ORDER — LABETALOL HCL 5 MG/ML IV SOLN: 10.0000 mg | INTRAVENOUS | Status: AC | PRN

## 2022-07-09 MED ORDER — HYDRALAZINE HCL 20 MG/ML IJ SOLN: 10.0000 mg | INTRAMUSCULAR | Status: AC | PRN

## 2022-07-09 MED ORDER — VERAPAMIL HCL 2.5 MG/ML IV SOLN
INTRAVENOUS | Status: AC
  Filled 2022-07-09: qty 2

## 2022-07-09 MED ORDER — DICLOFENAC SODIUM 1 % EX GEL
2.0000 g | Freq: Four times a day (QID) | CUTANEOUS | Status: DC
  Administered 2022-07-09 – 2022-07-10 (×4): 2 g via TOPICAL
  Filled 2022-07-09: qty 100

## 2022-07-09 MED ORDER — ALUM & MAG HYDROXIDE-SIMETH 200-200-20 MG/5ML PO SUSP
30.0000 mL | Freq: Once | ORAL | Status: AC
  Administered 2022-07-09: 30 mL via ORAL
  Filled 2022-07-09: qty 30

## 2022-07-09 MED ORDER — LIDOCAINE HCL (PF) 1 % IJ SOLN
INTRAMUSCULAR | Status: AC
  Filled 2022-07-09: qty 30

## 2022-07-09 MED ORDER — MIDAZOLAM HCL 2 MG/2ML IJ SOLN
INTRAMUSCULAR | Status: AC
  Filled 2022-07-09: qty 2

## 2022-07-09 MED ORDER — SODIUM CHLORIDE 0.9 % IV SOLN: INTRAVENOUS | Status: AC

## 2022-07-09 MED ORDER — HEPARIN (PORCINE) IN NACL 1000-0.9 UT/500ML-% IV SOLN
INTRAVENOUS | Status: DC | PRN
  Administered 2022-07-09 (×2): 500 mL

## 2022-07-09 MED ORDER — FENTANYL CITRATE (PF) 100 MCG/2ML IJ SOLN
INTRAMUSCULAR | Status: AC
  Filled 2022-07-09: qty 2

## 2022-07-09 MED ORDER — VERAPAMIL HCL 2.5 MG/ML IV SOLN
INTRAVENOUS | Status: DC | PRN
  Administered 2022-07-09: 10 mL via INTRA_ARTERIAL

## 2022-07-09 MED ORDER — FENTANYL CITRATE (PF) 100 MCG/2ML IJ SOLN
INTRAMUSCULAR | Status: DC | PRN
  Administered 2022-07-09: 50 ug via INTRAVENOUS

## 2022-07-09 SURGICAL SUPPLY — 10 items
BAND ZEPHYR COMPRESS 30 LONG (HEMOSTASIS) IMPLANT
CATH 5FR JL3.5 JR4 ANG PIG MP (CATHETERS) IMPLANT
GLIDESHEATH SLEND SS 6F .021 (SHEATH) IMPLANT
GUIDEWIRE INQWIRE 1.5J.035X260 (WIRE) IMPLANT
INQWIRE 1.5J .035X260CM (WIRE) ×1
KIT HEART LEFT (KITS) ×1 IMPLANT
PACK CARDIAC CATHETERIZATION (CUSTOM PROCEDURE TRAY) ×1 IMPLANT
SYR MEDRAD MARK 7 150ML (SYRINGE) ×1 IMPLANT
TRANSDUCER W/STOPCOCK (MISCELLANEOUS) ×1 IMPLANT
TUBING CIL FLEX 10 FLL-RA (TUBING) ×1 IMPLANT

## 2022-07-09 NOTE — Progress Notes (Addendum)
Pt had 4-5/10 CP prior to heart cath. When pt returned to the floor, pain was 0/10 due to medications giving intra-procedure. After about an hour, CP returned and GI cocktail maalox given. Pain has not changed since GI cocktail, 4-5/10, "uncomfortable, tight, pulsating with heartbeat".  Update: see MAR for new orders from PA Barrett

## 2022-07-09 NOTE — H&P (View-Only) (Signed)
Rounding Note    Patient Name: Alex Barnes Date of Encounter: 07/09/2022  [facility] Cardiologist: Dina Rich, MD   Subjective   Patient has had some chest pain overnight and this morning with no real change after a couple of doses of sublingual Nitro. He states he is currently still having 4/10 chest pain but does not appear to be in any distressed. He does not feel like he needs any additional medication for the pain right now. Offered to start IV Nitroglycerin but he gets a headache with the sublingual Nitro and would prefer to hold off on this. He also reports worsening dyspnea (primarily with exertion but also at rest sometimes) over the past month. In addition, he reports some intemittent fluttering in his chest and states chest pain gets worse during these times. He this sensation about 30 minutes ago. Reviewed telemetry which showed sinus bradycardia at this time with occasional PVCs but no concerning arrhythmias. He also had short episode of trigeminy PVCs last night. Patient has baseline bradycardia and states heart rates are normally in the 50s. Rates mostly in the 40s to 50s here at rest but will increase to the 70s with slight activity. He does report some mild dizziness at times often with the chest pain or after exercising.   Inpatient Medications    Scheduled Meds:  amLODipine  5 mg Oral Daily   [START ON 07/10/2022] aspirin EC  81 mg Oral Daily   clopidogrel  75 mg Oral Daily   lisinopril  5 mg Oral Daily   metoprolol tartrate  50 mg Oral BID   pantoprazole  40 mg Oral Daily   rosuvastatin  10 mg Oral Daily   sodium chloride flush  3 mL Intravenous Q12H   sodium chloride flush  3 mL Intravenous Q12H   Continuous Infusions:  sodium chloride     sodium chloride     sodium chloride 1 mL/kg/hr (07/09/22 0603)   heparin 1,600 Units/hr (07/09/22 0233)   PRN Meds: sodium chloride, sodium chloride, acetaminophen, nitroGLYCERIN, ondansetron, sodium  chloride flush, sodium chloride flush   Vital Signs    Vitals:   07/08/22 2334 07/09/22 0449 07/09/22 0718 07/09/22 0720  BP: 131/76 107/72 (!) 110/58 (!) 110/58  Pulse: (!) 56 (!) 52 (!) 50 (!) 48  Resp: 18 18 18 18   Temp: 97.6 F (36.4 C) 98 F (36.7 C) 98.1 F (36.7 C) 98.1 F (36.7 C)  TempSrc: Oral Oral Oral Oral  SpO2: 97% 95% 96% 95%  Weight:      Height:        Intake/Output Summary (Last 24 hours) at 07/09/2022 0740 Last data filed at 07/09/2022 0400 Gross per 24 hour  Intake 194.6 ml  Output 675 ml  Net -480.4 ml      07/08/2022    9:44 PM 07/08/2022   11:19 AM 12/30/2021    1:51 PM  Last 3 Weights  Weight (lbs) 293 lb 9.6 oz 290 lb 286 lb 3.2 oz  Weight (kg) 133.176 kg 131.543 kg 129.819 kg      Telemetry    Sinus bradycardia with rates mostly in the 40s to 50 but will increase to the 70s with light activity. Occasional PVC sometimes in trigeminy pattern. - Personally Reviewed  ECG    Sinus bradycardia, rate 50 bpm, with Q waves in inferior leads but no acute ST/T changes. - Personally Reviewed  Physical Exam   GEN: No acute distress.   Neck: No JVD.  Cardiac: Bradycardic with normal rhythm. No murmurs, rubs, or gallops. Radial pulses 2+ and equal bilaterally. Respiratory: Clear to auscultation bilaterally. No wheezes, rhonchi, or rales. GI: Soft, non-tended, and non-tender. MS: No lower extremity edema. No deformity. Skin: Warm and dry. Neuro:  No focal deficits. Psych: Normal affect. Responds appropriately.  Labs    High Sensitivity Troponin:   Recent Labs  Lab 07/08/22 1119 07/08/22 1315  TROPONINIHS 11 11     Chemistry Recent Labs  Lab 07/08/22 1119  NA 139  K 4.0  CL 109  CO2 25  GLUCOSE 102*  BUN 12  CREATININE 0.97  CALCIUM 8.9  GFRNONAA >60  ANIONGAP 5    Lipids No results for input(s): "CHOL", "TRIG", "HDL", "LABVLDL", "LDLCALC", "CHOLHDL" in the last 168 hours.  Hematology Recent Labs  Lab 07/08/22 1119  WBC 8.5  RBC  5.38  HGB 16.6  HCT 49.4  MCV 91.8  MCH 30.9  MCHC 33.6  RDW 13.7  PLT 323   Thyroid No results for input(s): "TSH", "FREET4" in the last 168 hours.  BNPNo results for input(s): "BNP", "PROBNP" in the last 168 hours.  DDimer No results for input(s): "DDIMER" in the last 168 hours.   Radiology    DG Chest 2 View  Result Date: 07/08/2022 CLINICAL DATA:  pt states that he is here for some chest pain, states that he is also having some SOB EXAM: CHEST - 2 VIEW COMPARISON:  Chest radiograph 07/04/2017 FINDINGS: The cardiomediastinal contours are within normal limits. The lungs are clear. No pneumothorax or pleural effusion. No acute finding in the visualized skeleton. IMPRESSION: No acute cardiopulmonary process. Electronically Signed   By: Emmaline Kluver M.D.   On: 07/08/2022 11:48    Cardiac Studies   Left Cardiac Catheterization 08/29/2021:   RPAV lesion is 100% stenosed.   Acute Mrg lesion is 95% stenosed.   Previously placed 2nd Mrg stent (unknown type) is  widely patent.   The left ventricular systolic function is normal.   LV end diastolic pressure is normal.   The left ventricular ejection fraction is 55-65% by visual estimate.   Single vessel occlusive CAD. Prior stent in the LCx is patent. There is a CTO of the PL branch of the RCA with epicardial collaterals from the LCx. There is also critical stenosis in the PDA which has a high take off from the mid RCA and is supplied by epicardial collaterals from the LAD. Normal LV function Normal LVEDP   Plan: will arrange for staged CTO PCI of the RCA lesions. Initiate Plavix therapy.   Diagnostic Dominance: Right  _______________  Coronary CTO Intervention 09/10/2021:   Acute Mrg lesion is 95% stenosed.   Dist RCA lesion is 100% stenosed.   Non-stenotic 2nd Mrg lesion was previously treated.   A drug-eluting stent was successfully placed using a SYNERGY XD 2.25X28.   Post intervention, there is a 0% residual stenosis.    Successful CTO PCI of the distal RCA with IVUS guidance and DES x 1 Unsuccessful PCI of the RV marginal branch due to acute angled take off and inability to cross with a wire.    Plan: DAPT for one year. Anticipate DC tomorrow.   Diagnostic Dominance: Right  Intervention    Patient Profile     51 y.o. male with a history of CAD with a history of NSTEMI in 07/2015 s/p DES to LCX and OM2 and then CTO intervention with DES to distal RCA in 09/2021, hypertension, and  hyperlipidemia who presented to the [facility] ED on 07/08/2022 with chest pain. He was seen by Cardiology there and decision was made to transfer him to [facility] for cardiac catheterization.  Assessment & Plan    Chest Pain CAD S/p prior stenting to LCX and OM2 in 2016 and then distal RCA in 2022. Now presents with intermittent chest pain with associated shortness of breath and diaphoresis over the last month.Also reported worsening dyspnea over this time mostly with exertion. No other CHF symptoms. Symptoms concerns for unstable angina. EKG showed no acute ischemic changes. High-sensitivity troponin negative x2 at 11 >> 11. - Reports some 4/10 chest pain this morning with no significant improvement with sublingual Nitro. Still having pain at this time but does not look to be in any acute distress. Offered to start IV Nitro but patient would like to hold off at this time. - Continue IV Heparin. - Echo has been ordered. - Continue DAPT with Aspirin and Plavix. - Continue Lopressor 50mg  twice daily. - Continue Crestor 10mg  daily. Also on Repatha at home. - Plan is for left cardiac catheterization later today. Advised to let know if chest pain worsens prior to this and he needs anything for the pain.  The patient understands that risks include but are not limited to stroke (1 in 1000), death (1 in 1000), kidney failure [usually temporary] (1 in 500), bleeding (1 in 200), allergic reaction [possibly serious] (1 in 200), and  agrees to proceed.   Sinus Bradycardia PVCs Patient states his heart rates have always run low - mostly in the 50s. He also reports some intermittent fluttering sensation. - Telemetry shows mostly sinus bradycardia at rest with rates in the 40s to 50s but increases to 70s with slight activity. Also showed occasional PVCs with some trigeminy last night. - Continue Lopressor 50mg  twice daily. Did initially consider decreasing this but with PVCs and patient's reports of flutter with associated worsening chest pain, will continue current dose for now.  Hypertension BP mostly well controlled. - Continue Amlodipine 5mg  daily. - Continue Lisinopril 5mg  daily. - Continue Lopressor 50mg  twice daily.  Hyperlipidemia Last LDL in 09/2021 was 67. - Continue home Crestor 10mg  daily. Also on Repatha at home. - Will recheck fasting lipid panel.  For questions or updates, please contact [facility] Please consult [email or web address] for contact info under        Signed, [place], PA-C  07/09/2022, 7:40 AM    Patient seen and examined and agree with , PA-C as detailed above.  In brief, the patient is a 51 y.o. male with a history of CAD with a history of NSTEMI in 07/2015 s/p DES to LCX and OM2 and then CTO intervention with DES to distal RCA in 09/2021, hypertension, and hyperlipidemia who presented to [facility] with worsening exertional chest pain with concern for UA now transferred to [facility].  Patient with known CAD with prior LCX and OM2 PCI in 2016 and then distal RCA in 2022. He now presents with 3 week history of progressive chest pain, diaphoresis and SOB with exertion. At [facility], trop 11>11. ECG non ischemic. Given known CAD and symptoms, however, there is concern for UA prompting transfer here.  Currently, the patient continues to have mild chest discomfort but had significant HA to IV nitro and wanted to hold off. ECG unchanged with no acute ischemic findings. Planned for cath  today.  GEN: No acute distress.   Neck: No JVD Cardiac: Bradycardic, regular,  1/6 systolic murmur Respiratory: Clear to auscultation bilaterally. GI: Soft, nontender, non-distended  MS: No edema; No deformity. Neuro:  Nonfocal  Psych: Normal affect    Plan: -Plan for [facility] today -Follow-up TTE -Continue ASA 81mg  daily, plavix 75mg  daily -Continue crestor 10mg  daily and is on repatha as outpatient -Continue metop 50mg  BID, lisinopril 5mg  daily -Nitro prn  INFORMED CONSENT: I have reviewed the risks, indications, and alternatives to cardiac catheterization, possible angioplasty, and stenting with the patient. Risks include but are not limited to bleeding, infection, vascular injury, stroke, myocardial infection, arrhythmia, kidney injury, radiation-related injury in the case of prolonged fluoroscopy use, emergency cardiac surgery, and death. The patient understands the risks of serious complication is 1-2 in 1000 with diagnostic cardiac cath and 1-2% or less with angioplasty/stenting.    , MD

## 2022-07-09 NOTE — Progress Notes (Addendum)
Rounding Note    Patient Name: Alex Barnes Date of Encounter: 07/09/2022  [facility] Cardiologist: Dina Rich, MD   Subjective   Patient has had some chest pain overnight and this morning with no real change after a couple of doses of sublingual Nitro. He states he is currently still having 4/10 chest pain but does not appear to be in any distressed. He does not feel like he needs any additional medication for the pain right now. Offered to start IV Nitroglycerin but he gets a headache with the sublingual Nitro and would prefer to hold off on this. He also reports worsening dyspnea (primarily with exertion but also at rest sometimes) over the past month. In addition, he reports some intemittent fluttering in his chest and states chest pain gets worse during these times. He this sensation about 30 minutes ago. Reviewed telemetry which showed sinus bradycardia at this time with occasional PVCs but no concerning arrhythmias. He also had short episode of trigeminy PVCs last night. Patient has baseline bradycardia and states heart rates are normally in the 50s. Rates mostly in the 40s to 50s here at rest but will increase to the 70s with slight activity. He does report some mild dizziness at times often with the chest pain or after exercising.   Inpatient Medications    Scheduled Meds:  amLODipine  5 mg Oral Daily   [START ON 07/10/2022] aspirin EC  81 mg Oral Daily   clopidogrel  75 mg Oral Daily   lisinopril  5 mg Oral Daily   metoprolol tartrate  50 mg Oral BID   pantoprazole  40 mg Oral Daily   rosuvastatin  10 mg Oral Daily   sodium chloride flush  3 mL Intravenous Q12H   sodium chloride flush  3 mL Intravenous Q12H   Continuous Infusions:  sodium chloride     sodium chloride     sodium chloride 1 mL/kg/hr (07/09/22 0603)   heparin 1,600 Units/hr (07/09/22 0233)   PRN Meds: sodium chloride, sodium chloride, acetaminophen, nitroGLYCERIN, ondansetron, sodium  chloride flush, sodium chloride flush   Vital Signs    Vitals:   07/08/22 2334 07/09/22 0449 07/09/22 0718 07/09/22 0720  BP: 131/76 107/72 (!) 110/58 (!) 110/58  Pulse: (!) 56 (!) 52 (!) 50 (!) 48  Resp: 18 18 18 18   Temp: 97.6 F (36.4 C) 98 F (36.7 C) 98.1 F (36.7 C) 98.1 F (36.7 C)  TempSrc: Oral Oral Oral Oral  SpO2: 97% 95% 96% 95%  Weight:      Height:        Intake/Output Summary (Last 24 hours) at 07/09/2022 0740 Last data filed at 07/09/2022 0400 Gross per 24 hour  Intake 194.6 ml  Output 675 ml  Net -480.4 ml      07/08/2022    9:44 PM 07/08/2022   11:19 AM 12/30/2021    1:51 PM  Last 3 Weights  Weight (lbs) 293 lb 9.6 oz 290 lb 286 lb 3.2 oz  Weight (kg) 133.176 kg 131.543 kg 129.819 kg      Telemetry    Sinus bradycardia with rates mostly in the 40s to 50 but will increase to the 70s with light activity. Occasional PVC sometimes in trigeminy pattern. - Personally Reviewed  ECG    Sinus bradycardia, rate 50 bpm, with Q waves in inferior leads but no acute ST/T changes. - Personally Reviewed  Physical Exam   GEN: No acute distress.   Neck: No JVD.  Cardiac: Bradycardic with normal rhythm. No murmurs, rubs, or gallops. Radial pulses 2+ and equal bilaterally. Respiratory: Clear to auscultation bilaterally. No wheezes, rhonchi, or rales. GI: Soft, non-tended, and non-tender. MS: No lower extremity edema. No deformity. Skin: Warm and dry. Neuro:  No focal deficits. Psych: Normal affect. Responds appropriately.  Labs    High Sensitivity Troponin:   Recent Labs  Lab 07/08/22 1119 07/08/22 1315  TROPONINIHS 11 11     Chemistry Recent Labs  Lab 07/08/22 1119  NA 139  K 4.0  CL 109  CO2 25  GLUCOSE 102*  BUN 12  CREATININE 0.97  CALCIUM 8.9  GFRNONAA >60  ANIONGAP 5    Lipids No results for input(s): "CHOL", "TRIG", "HDL", "LABVLDL", "LDLCALC", "CHOLHDL" in the last 168 hours.  Hematology Recent Labs  Lab 07/08/22 1119  WBC 8.5  RBC  5.38  HGB 16.6  HCT 49.4  MCV 91.8  MCH 30.9  MCHC 33.6  RDW 13.7  PLT 323   Thyroid No results for input(s): "TSH", "FREET4" in the last 168 hours.  BNPNo results for input(s): "BNP", "PROBNP" in the last 168 hours.  DDimer No results for input(s): "DDIMER" in the last 168 hours.   Radiology    DG Chest 2 View  Result Date: 07/08/2022 CLINICAL DATA:  pt states that he is here for some chest pain, states that he is also having some SOB EXAM: CHEST - 2 VIEW COMPARISON:  Chest radiograph 07/04/2017 FINDINGS: The cardiomediastinal contours are within normal limits. The lungs are clear. No pneumothorax or pleural effusion. No acute finding in the visualized skeleton. IMPRESSION: No acute cardiopulmonary process. Electronically Signed   By: Emmaline Kluver M.D.   On: 07/08/2022 11:48    Cardiac Studies   Left Cardiac Catheterization 08/29/2021:   RPAV lesion is 100% stenosed.   Acute Mrg lesion is 95% stenosed.   Previously placed 2nd Mrg stent (unknown type) is  widely patent.   The left ventricular systolic function is normal.   LV end diastolic pressure is normal.   The left ventricular ejection fraction is 55-65% by visual estimate.   Single vessel occlusive CAD. Prior stent in the LCx is patent. There is a CTO of the PL branch of the RCA with epicardial collaterals from the LCx. There is also critical stenosis in the PDA which has a high take off from the mid RCA and is supplied by epicardial collaterals from the LAD. Normal LV function Normal LVEDP   Plan: will arrange for staged CTO PCI of the RCA lesions. Initiate Plavix therapy.   Diagnostic Dominance: Right  _______________  Coronary CTO Intervention 09/10/2021:   Acute Mrg lesion is 95% stenosed.   Dist RCA lesion is 100% stenosed.   Non-stenotic 2nd Mrg lesion was previously treated.   A drug-eluting stent was successfully placed using a SYNERGY XD 2.25X28.   Post intervention, there is a 0% residual stenosis.    Successful CTO PCI of the distal RCA with IVUS guidance and DES x 1 Unsuccessful PCI of the RV marginal branch due to acute angled take off and inability to cross with a wire.    Plan: DAPT for one year. Anticipate DC tomorrow.   Diagnostic Dominance: Right  Intervention    Patient Profile     51 y.o. male with a history of CAD with a history of NSTEMI in 07/2015 s/p DES to LCX and OM2 and then CTO intervention with DES to distal RCA in 09/2021, hypertension, and  hyperlipidemia who presented to the [facility] ED on 07/08/2022 with chest pain. He was seen by Cardiology there and decision was made to transfer him to [facility] for cardiac catheterization.  Assessment & Plan    Chest Pain CAD S/p prior stenting to LCX and OM2 in 2016 and then distal RCA in 2022. Now presents with intermittent chest pain with associated shortness of breath and diaphoresis over the last month.Also reported worsening dyspnea over this time mostly with exertion. No other CHF symptoms. Symptoms concerns for unstable angina. EKG showed no acute ischemic changes. High-sensitivity troponin negative x2 at 11 >> 11. - Reports some 4/10 chest pain this morning with no significant improvement with sublingual Nitro. Still having pain at this time but does not look to be in any acute distress. Offered to start IV Nitro but patient would like to hold off at this time. - Continue IV Heparin. - Echo has been ordered. - Continue DAPT with Aspirin and Plavix. - Continue Lopressor 50mg  twice daily. - Continue Crestor 10mg  daily. Also on Repatha at home. - Plan is for left cardiac catheterization later today. Advised to let know if chest pain worsens prior to this and he needs anything for the pain.  The patient understands that risks include but are not limited to stroke (1 in 1000), death (1 in 1000), kidney failure [usually temporary] (1 in 500), bleeding (1 in 200), allergic reaction [possibly serious] (1 in 200), and  agrees to proceed.   Sinus Bradycardia PVCs Patient states his heart rates have always run low - mostly in the 50s. He also reports some intermittent fluttering sensation. - Telemetry shows mostly sinus bradycardia at rest with rates in the 40s to 50s but increases to 70s with slight activity. Also showed occasional PVCs with some trigeminy last night. - Continue Lopressor 50mg  twice daily. Did initially consider decreasing this but with PVCs and patient's reports of flutter with associated worsening chest pain, will continue current dose for now.  Hypertension BP mostly well controlled. - Continue Amlodipine 5mg  daily. - Continue Lisinopril 5mg  daily. - Continue Lopressor 50mg  twice daily.  Hyperlipidemia Last LDL in 09/2021 was 67. - Continue home Crestor 10mg  daily. Also on Repatha at home. - Will recheck fasting lipid panel.  For questions or updates, please contact [facility] Please consult [email or web address] for contact info under        Signed, [place], PA-C  07/09/2022, 7:40 AM    Patient seen and examined and agree with , PA-C as detailed above.  In brief, the patient is a 51 y.o. male with a history of CAD with a history of NSTEMI in 07/2015 s/p DES to LCX and OM2 and then CTO intervention with DES to distal RCA in 09/2021, hypertension, and hyperlipidemia who presented to [facility] with worsening exertional chest pain with concern for UA now transferred to [facility].  Patient with known CAD with prior LCX and OM2 PCI in 2016 and then distal RCA in 2022. He now presents with 3 week history of progressive chest pain, diaphoresis and SOB with exertion. At [facility], trop 11>11. ECG non ischemic. Given known CAD and symptoms, however, there is concern for UA prompting transfer here.  Currently, the patient continues to have mild chest discomfort but had significant HA to IV nitro and wanted to hold off. ECG unchanged with no acute ischemic findings. Planned for cath  today.  GEN: No acute distress.   Neck: No JVD Cardiac: Bradycardic, regular,  1/6 systolic murmur Respiratory: Clear to auscultation bilaterally. GI: Soft, nontender, non-distended  MS: No edema; No deformity. Neuro:  Nonfocal  Psych: Normal affect    Plan: -Plan for [facility] today -Follow-up TTE -Continue ASA 81mg  daily, plavix 75mg  daily -Continue crestor 10mg  daily and is on repatha as outpatient -Continue metop 50mg  BID, lisinopril 5mg  daily -Nitro prn  INFORMED CONSENT: I have reviewed the risks, indications, and alternatives to cardiac catheterization, possible angioplasty, and stenting with the patient. Risks include but are not limited to bleeding, infection, vascular injury, stroke, myocardial infection, arrhythmia, kidney injury, radiation-related injury in the case of prolonged fluoroscopy use, emergency cardiac surgery, and death. The patient understands the risks of serious complication is 1-2 in 1000 with diagnostic cardiac cath and 1-2% or less with angioplasty/stenting.    , MD

## 2022-07-09 NOTE — Progress Notes (Signed)
ANTICOAGULATION CONSULT NOTE - Initial Consult  Pharmacy Consult for Heparin Indication: unstable angina  No Known Allergies  Patient Measurements: Height: 5\' 10"  (177.8 cm) Weight: 133.2 kg (293 lb 9.6 oz) IBW/kg (Calculated) : 73 HEPARIN DW (KG): 103.8   Vital Signs: Temp: 97.5 F (36.4 C) (09/07 0754) Temp Source: Oral (09/07 0754) BP: 123/73 (09/07 0754) Pulse Rate: 53 (09/07 0754)  Labs: Recent Labs    07/08/22 1119 07/08/22 1315 07/08/22 1724 07/09/22 0705  HGB 16.6  --   --  15.5  HCT 49.4  --   --  46.5  PLT 323  --   --  292  HEPARINUNFRC  --   --  0.27* 0.30  CREATININE 0.97  --   --   --   TROPONINIHS 11 11  --   --      Estimated Creatinine Clearance: 126.5 mL/min (by C-G formula based on SCr of 0.97 mg/dL).   Medical History: Past Medical History:  Diagnosis Date   CAD (coronary artery disease)    cath 9/26 & 07/31/2015 100% total occlusion of post atrio branch of RCA, LV supplied by collaterals from the L atrial recurrent branch of distal LCx, 75-80% mid to distal LCx stenosis, high grade complex stenosis of OM2, patent LAD and RI, EF 55-60%. On 9/8, single DES to midLCx extending into OM2.   Headache    "weekly" (07/29/2015)   Hypercholesteremia    NSTEMI (non-ST elevated myocardial infarction) (HCC) 07/28/2015    Medications:  See med rec  Assessment: Pt presents to ED with some chest pain, and  some sob. He has hx of heart attack with two stents.  Pharmacy asked to start heparin. Plans noted for cath today -heparin at the low end of goal on 1600 units/hr  Goal of Therapy:  Heparin level 0.3-0.7 units/ml Monitor platelets by anticoagulation protocol: Yes   Plan:  -Increase heparin to 1700 units/hr -Will follow plans post cath  07/30/2015, PharmD Clinical Pharmacist **Pharmacist phone directory can now be found on [email or web address] ([email or web address]).  Listed under [facility].

## 2022-07-09 NOTE — Progress Notes (Signed)
  Echocardiogram 2D Echocardiogram has been performed.  Alex Barnes 07/09/2022, 10:36 AM

## 2022-07-09 NOTE — Progress Notes (Signed)
Called re: CP  Pt has had nitro, GI cocktail and sedation for cath over the course of the day.  Only thing that made a significant difference was the sedation. Pain has been continuous at a 4-5/10 all day.   Plan: will order a 1-time dose of vicodin, to help him get some relief tonight. Will order Voltaren gel to affected area as well.  May need to treat pericardial pain w/ NSAIDs, but he has had a lot in the last 24 hr, so will wait on that.   F/u in am, hopefully will be able to d/c  Theodore Demark, PA-C 07/09/2022 6:36 PM

## 2022-07-09 NOTE — Interval H&P Note (Signed)
History and Physical Interval Note:  07/09/2022 11:31 AM  Alex Barnes  has presented today for surgery, with the diagnosis of unstable angina.  The various methods of treatment have been discussed with the patient and family. After consideration of risks, benefits and other options for treatment, the patient has consented to  Procedure(s): LEFT HEART CATH AND CORONARY ANGIOGRAPHY (N/A) as a surgical intervention.  The patient's history has been reviewed, patient examined, no change in status, stable for surgery.  I have reviewed the patient's chart and labs.  Questions were answered to the patient's satisfaction.    Cath Lab Visit (complete for each Cath Lab visit)  Clinical Evaluation Leading to the Procedure:   ACS: No.  Non-ACS:    Anginal Classification: CCS III  Anti-ischemic medical therapy: Maximal Therapy (2 or more classes of medications)  Non-Invasive Test Results: No non-invasive testing performed  Prior CABG: No previous CABG        Verne Carrow

## 2022-07-09 NOTE — Care Management (Signed)
  Transition of Care [facility]) Screening Note   Patient Details  Name: Alex Barnes Date of Birth: 1971-01-27   Transition of Care [facility]) CM/SW Contact:    Gala Lewandowsky, RN Phone Number: 07/09/2022, 2:31 PM    Transition of Care Department [facility]) has reviewed the patient and no TOC needs have been identified at this time. We will continue to monitor patient advancement through interdisciplinary progression rounds. If new patient transition needs arise, please place a TOC consult.

## 2022-07-09 NOTE — Progress Notes (Signed)
Some complaints of chest pain, rated 6 out of 10. Given 2 doses of PRN nitro sublingual before patient stated pain let up a just a little to 5 out of 10. Offered to page physician to see about other options, but patient stated he felt ok and would try to sleep. Also stated feeling of heart fluttering a couple times; noted to have some trigeminal PVCs. Anticipating and prepping for heart cath 9/7.

## 2022-07-10 DIAGNOSIS — R079 Chest pain, unspecified: Principal | ICD-10-CM

## 2022-07-10 DIAGNOSIS — Z20822 Contact with and (suspected) exposure to covid-19: Secondary | ICD-10-CM | POA: Diagnosis not present

## 2022-07-10 DIAGNOSIS — F17210 Nicotine dependence, cigarettes, uncomplicated: Secondary | ICD-10-CM | POA: Diagnosis not present

## 2022-07-10 DIAGNOSIS — I25119 Atherosclerotic heart disease of native coronary artery with unspecified angina pectoris: Secondary | ICD-10-CM | POA: Diagnosis not present

## 2022-07-10 DIAGNOSIS — E782 Mixed hyperlipidemia: Secondary | ICD-10-CM | POA: Diagnosis not present

## 2022-07-10 DIAGNOSIS — R001 Bradycardia, unspecified: Secondary | ICD-10-CM | POA: Diagnosis not present

## 2022-07-10 DIAGNOSIS — I10 Essential (primary) hypertension: Secondary | ICD-10-CM | POA: Diagnosis not present

## 2022-07-10 DIAGNOSIS — I25110 Atherosclerotic heart disease of native coronary artery with unstable angina pectoris: Secondary | ICD-10-CM | POA: Diagnosis not present

## 2022-07-10 DIAGNOSIS — Z7902 Long term (current) use of antithrombotics/antiplatelets: Secondary | ICD-10-CM | POA: Diagnosis not present

## 2022-07-10 DIAGNOSIS — Z7982 Long term (current) use of aspirin: Secondary | ICD-10-CM | POA: Diagnosis not present

## 2022-07-10 DIAGNOSIS — Z79899 Other long term (current) drug therapy: Secondary | ICD-10-CM | POA: Diagnosis not present

## 2022-07-10 DIAGNOSIS — F17290 Nicotine dependence, other tobacco product, uncomplicated: Secondary | ICD-10-CM | POA: Diagnosis not present

## 2022-07-10 LAB — BASIC METABOLIC PANEL
Anion gap: 9 (ref 5–15)
BUN: 12 mg/dL (ref 6–20)
CO2: 24 mmol/L (ref 22–32)
Calcium: 9.2 mg/dL (ref 8.9–10.3)
Chloride: 104 mmol/L (ref 98–111)
Creatinine, Ser: 1.09 mg/dL (ref 0.61–1.24)
GFR, Estimated: >60 mL/min (ref >60)
Glucose, Bld: 108 mg/dL — ABNORMAL HIGH (ref 70–99)
Potassium: 4.2 mmol/L (ref 3.5–5.1)
Sodium: 137 mmol/L (ref 135–145)

## 2022-07-10 LAB — SEDIMENTATION RATE: Sed Rate: 4 mm/hr (ref 0–16)

## 2022-07-10 LAB — CBC
HCT: 47.8 % (ref 39.0–52.0)
Hemoglobin: 15.9 g/dL (ref 13.0–17.0)
MCH: 30.9 pg (ref 26.0–34.0)
MCHC: 33.3 g/dL (ref 30.0–36.0)
MCV: 92.8 fL (ref 80.0–100.0)
Platelets: 298 10*3/uL (ref 150–400)
RBC: 5.15 MIL/uL (ref 4.22–5.81)
RDW: 13.6 % (ref 11.5–15.5)
WBC: 8.6 10*3/uL (ref 4.0–10.5)
nRBC: 0 % (ref 0.0–0.2)

## 2022-07-10 LAB — SARS CORONAVIRUS 2 BY RT PCR: SARS Coronavirus 2 by RT PCR: NEGATIVE

## 2022-07-10 LAB — C-REACTIVE PROTEIN: CRP: 0.5 mg/dL (ref <1.0)

## 2022-07-10 LAB — LIPOPROTEIN A (LPA): Lipoprotein (a): 64.9 nmol/L — ABNORMAL HIGH (ref <75.0)

## 2022-07-10 MED ORDER — IBUPROFEN 600 MG PO TABS
800.0000 mg | ORAL_TABLET | Freq: Three times a day (TID) | ORAL | Status: AC
  Administered 2022-07-10 (×3): 800 mg via ORAL
  Filled 2022-07-10 (×3): qty 1

## 2022-07-10 NOTE — Progress Notes (Signed)
Patient rating chest pain in the 4-5 range out of 10. Given PRN tylenol and tramadol along with scheduled voltaren gel before sleep. Patient declined one time dose of vicodin. Heart rate dropped to 38 intermittently while sleeping, generally staying in the 40-50s.

## 2022-07-10 NOTE — Progress Notes (Addendum)
Rounding Note    Patient Name: Alex Barnes Date of Encounter: 07/10/2022  [place] Cardiologist: Carlyle Dolly, MD   Subjective   Patient reports last night he took Tylenol and tramadol with improved pain.  Currently having left-sided chest discomfort at 2/10 intensity.  Heart rate in 50s but does feel pulsation.   Inpatient Medications    Scheduled Meds:  amLODipine  5 mg Oral Daily   aspirin EC  81 mg Oral Daily   clopidogrel  75 mg Oral Daily   diclofenac Sodium  2 g Topical QID   HYDROcodone-acetaminophen  1-2 tablet Oral Once   ibuprofen  800 mg Oral TID   lisinopril  5 mg Oral Daily   metoprolol tartrate  50 mg Oral BID   pantoprazole  40 mg Oral Daily   rosuvastatin  10 mg Oral Daily   sodium chloride flush  3 mL Intravenous Q12H   sodium chloride flush  3 mL Intravenous Q12H   sodium chloride flush  3 mL Intravenous Q12H   Continuous Infusions:  sodium chloride     sodium chloride     PRN Meds: sodium chloride, sodium chloride, acetaminophen, nitroGLYCERIN, ondansetron, sodium chloride flush, sodium chloride flush, traMADol   Vital Signs    Vitals:   07/09/22 1216 07/09/22 1517 07/09/22 2009 07/10/22 0532  BP: 124/74 (!) 125/58 (!) 145/66 112/61  Pulse: (!) 59  60 (!) 45  Resp: '16  18 18  ' Temp: (!) 97.5 F (36.4 C)  98 F (36.7 C) 98 F (36.7 C)  TempSrc: Oral  Oral Oral  SpO2: 93%  97% 95%  Weight:      Height:        Intake/Output Summary (Last 24 hours) at 07/10/2022 0820 Last data filed at 07/09/2022 1552 Gross per 24 hour  Intake --  Output 1700 ml  Net -1700 ml      07/08/2022    9:44 PM 07/08/2022   11:19 AM 12/30/2021    1:51 PM  Last 3 Weights  Weight (lbs) 293 lb 9.6 oz 290 lb 286 lb 3.2 oz  Weight (kg) 133.176 kg 131.543 kg 129.819 kg      Telemetry    Sinus bradycardia  - Personally Reviewed  ECG    N/A  Physical Exam   GEN: No acute distress.   Neck: No JVD Cardiac: RRR, no murmurs, rubs, or  gallops.cath site without hematoma  Respiratory: Clear to auscultation bilaterally. GI: Soft, nontender, non-distended  MS: No edema; No deformity. Neuro:  Nonfocal  Psych: Normal affect   Labs    High Sensitivity Troponin:   Recent Labs  Lab 07/08/22 1119 07/08/22 1315 07/09/22 0705  TROPONINIHS '11 11 11     ' Chemistry Recent Labs  Lab 07/08/22 1119 07/09/22 0705 07/10/22 0432  NA 139 137 137  K 4.0 4.1 4.2  CL 109 106 104  CO2 '25 24 24  ' GLUCOSE 102* 101* 108*  BUN '12 10 12  ' CREATININE 0.97 0.98 1.09  CALCIUM 8.9 8.7* 9.2  PROT  --  6.4*  --   ALBUMIN  --  3.5  --   AST  --  27  --   ALT  --  38  --   ALKPHOS  --  30*  --   BILITOT  --  0.6  --   GFRNONAA >60 >60 >60  ANIONGAP '5 7 9    ' Lipids  Recent Labs  Lab 07/09/22 0705  CHOL 93  TRIG 133  HDL 22*  LDLCALC 44  CHOLHDL 4.2    Hematology Recent Labs  Lab 07/08/22 1119 07/09/22 0705 07/10/22 0432  WBC 8.5 7.6 8.6  RBC 5.38 5.03 5.15  HGB 16.6 15.5 15.9  HCT 49.4 46.5 47.8  MCV 91.8 92.4 92.8  MCH 30.9 30.8 30.9  MCHC 33.6 33.3 33.3  RDW 13.7 13.7 13.6  PLT 323 292 298   Thyroid No results for input(s): "TSH", "FREET4" in the last 168 hours.  BNPNo results for input(s): "BNP", "PROBNP" in the last 168 hours.  DDimer No results for input(s): "DDIMER" in the last 168 hours.   Radiology    ECHOCARDIOGRAM COMPLETE  Result Date: 07/09/2022    ECHOCARDIOGRAM REPORT   Patient Name:   JAKIN PAVAO Date of Exam: 07/09/2022 Medical Rec #:  [id number]      Height:       70.0 in Accession #:    [id number]     Weight:       293.6 lb Date of Birth:  1971/08/30      BSA:          2.457 m Patient Age:    51 years       BP:           132/56 mmHg Patient Gender: M              HR:           58 bpm. Exam Location:  Inpatient Procedure: 2D Echo, Cardiac Doppler, Color Doppler and Intracardiac            Opacification Agent Indications:    Chest Pain  History:        Patient has prior history of Echocardiogram  examinations, most                 recent 05/22/2021. Previous Myocardial Infarction and CAD.  Sonographer:    Eartha Inch Referring Phys: [id number] [place] Comments: Technically difficult study due to poor echo windows. Image acquisition challenging due to patient body habitus and Image acquisition challenging due to respiratory motion. IMPRESSIONS  1. Left ventricular ejection fraction, by estimation, is 60 to 65%. The left ventricle has normal function. The left ventricle has no regional wall motion abnormalities. There is mild left ventricular hypertrophy. Left ventricular diastolic parameters were normal.  2. Right ventricular systolic function is normal. The right ventricular size is normal.  3. The mitral valve is normal in structure. No evidence of mitral valve regurgitation. No evidence of mitral stenosis.  4. The aortic valve has an indeterminant number of cusps. Aortic valve regurgitation is mild. No aortic stenosis is present. Comparison(s): No significant change from prior study. FINDINGS  Left Ventricle: Left ventricular ejection fraction, by estimation, is 60 to 65%. The left ventricle has normal function. The left ventricle has no regional wall motion abnormalities. Definity contrast agent was given IV to delineate the left ventricular  endocardial borders. The left ventricular internal cavity size was normal in size. There is mild left ventricular hypertrophy. Left ventricular diastolic parameters were normal. Right Ventricle: The right ventricular size is normal. Right ventricular systolic function is normal. Left Atrium: Left atrial size was normal in size. Right Atrium: Right atrial size was normal in size. Pericardium: There is no evidence of pericardial effusion. Mitral Valve: The mitral valve is normal in structure. No evidence of mitral valve regurgitation. No evidence of mitral valve stenosis. Tricuspid Valve: The tricuspid valve is  normal in structure. Tricuspid valve  regurgitation is not demonstrated. No evidence of tricuspid stenosis. Aortic Valve: The aortic valve has an indeterminant number of cusps. Aortic valve regurgitation is mild. Aortic regurgitation PHT measures 405 msec. No aortic stenosis is present. Pulmonic Valve: The pulmonic valve was normal in structure. Pulmonic valve regurgitation is not visualized. No evidence of pulmonic stenosis. Aorta: The aortic root is normal in size and structure. Venous: The inferior vena cava was not well visualized. IAS/Shunts: The interatrial septum was not well visualized.  LEFT VENTRICLE PLAX 2D LVIDd:         4.40 cm      Diastology LVIDs:         3.70 cm      LV e' medial:    8.11 cm/s LV PW:         1.35 cm      LV E/e' medial:  9.5 LV IVS:        1.35 cm      LV e' lateral:   12.60 cm/s LVOT diam:     2.50 cm      LV E/e' lateral: 6.1 LV SV:         112 LV SV Index:   46 LVOT Area:     4.91 cm  LV Volumes (MOD) LV vol d, MOD A2C: 156.0 ml LV vol d, MOD A4C: 207.0 ml LV vol s, MOD A2C: 52.8 ml LV vol s, MOD A4C: 99.7 ml LV SV MOD A2C:     103.2 ml LV SV MOD A4C:     207.0 ml LV SV MOD BP:      109.1 ml RIGHT VENTRICLE RV S prime:     9.24 cm/s TAPSE (M-mode): 2.0 cm LEFT ATRIUM             Index        RIGHT ATRIUM           Index LA diam:        4.30 cm 1.75 cm/m   RA Area:     15.10 cm LA Vol (A2C):   55.2 ml 22.46 ml/m  RA Volume:   37.40 ml  15.22 ml/m LA Vol (A4C):   58.7 ml 23.89 ml/m LA Biplane Vol: 59.9 ml 24.38 ml/m  AORTIC VALVE LVOT Vmax:   103.00 cm/s LVOT Vmean:  76.700 cm/s LVOT VTI:    0.229 m AI PHT:      405 msec  AORTA Ao Root diam: 3.00 cm Ao Asc diam:  3.10 cm MITRAL VALVE MV Area (PHT): 3.81 cm    SHUNTS MV Decel Time: 199 msec    Systemic VTI:  0.23 m MV E velocity: 76.70 cm/s  Systemic Diam: 2.50 cm MV A velocity: 36.80 cm/s MV E/A ratio:  2.08 Kirk Ruths MD Electronically signed by Kirk Ruths MD Signature Date/Time: 07/09/2022/12:30:07 PM    Final    CARDIAC CATHETERIZATION  Result  Date: 07/09/2022   Acute Mrg lesion is 95% stenosed.   RPAV lesion is 100% stenosed.   Previously placed Dist RCA stent of unknown type is  widely patent.   Previously placed 2nd Mrg stent of unknown type is  widely patent. No obstructive disease in the large LAD Patent mid Circumflex/OM stent without restenosis Patent distal RCA stent.  Chronic occlusion right posterolateral branch which fills from left to right collaterals. The moderate caliber RV marginal branch is unchanged from last cath with severe ostial stenosis (this lesion could not be  crossed during attempted PCI in 2022). No change in coronary anatomy since last cath in 2022. Chest pain has been continuous for 48 hours with negative troponin. Suspect chest pain is not cardiac related. Recommendations: Continued medical management of CAD. Chest pain has been continuous for 48 hours with negative troponin. Suspect chest pain is not cardiac related. Will give a GI cocktail.   DG Chest 2 View  Result Date: 07/08/2022 CLINICAL DATA:  pt states that he is here for some chest pain, states that he is also having some SOB EXAM: CHEST - 2 VIEW COMPARISON:  Chest radiograph 07/04/2017 FINDINGS: The cardiomediastinal contours are within normal limits. The lungs are clear. No pneumothorax or pleural effusion. No acute finding in the visualized skeleton. IMPRESSION: No acute cardiopulmonary process. Electronically Signed   By: Audie Pinto M.D.   On: 07/08/2022 11:48    Cardiac Studies   Echo 07/09/22 1. Left ventricular ejection fraction, by estimation, is 60 to 65%. The  left ventricle has normal function. The left ventricle has no regional  wall motion abnormalities. There is mild left ventricular hypertrophy.  Left ventricular diastolic parameters  were normal.   2. Right ventricular systolic function is normal. The right ventricular  size is normal.   3. The mitral valve is normal in structure. No evidence of mitral valve  regurgitation. No  evidence of mitral stenosis.   4. The aortic valve has an indeterminant number of cusps. Aortic valve  regurgitation is mild. No aortic stenosis is present.   Comparison(s): No significant change from prior study.    LEFT HEART CATH AND CORONARY ANGIOGRAPHY 07/09/22   Conclusion      Acute Mrg lesion is 95% stenosed.   RPAV lesion is 100% stenosed.   Previously placed Dist RCA stent of unknown type is  widely patent.   Previously placed 2nd Mrg stent of unknown type is  widely patent.   No obstructive disease in the large LAD Patent mid Circumflex/OM stent without restenosis Patent distal RCA stent.  Chronic occlusion right posterolateral branch which fills from left to right collaterals. The moderate caliber RV marginal branch is unchanged from last cath with severe ostial stenosis (this lesion could not be crossed during attempted PCI in 2022).  No change in coronary anatomy since last cath in 2022.  Chest pain has been continuous for 48 hours with negative troponin. Suspect chest pain is not cardiac related.    Recommendations: Continued medical management of CAD. Chest pain has been continuous for 48 hours with negative troponin. Suspect chest pain is not cardiac related. Will give a GI cocktail.     Diagnostic Dominance: Right  Patient Profile     51 y.o. male with a history of CAD with a history of NSTEMI in 07/2015 s/p DES to LCX and OM2 and then CTO intervention with DES to distal RCA in 09/2021, hypertension, and hyperlipidemia who presented to the [facility] ED on 07/08/2022 with chest pain. He was seen by Cardiology there and decision was made to transfer him to [facility] for cardiac catheterization.  Assessment & Plan    Chest pain  Troponin negative cardiac catheterization showed stable disease.  No change from prior cath.  His symptoms felt noncardiac.  Given GI cocktail but no improvement.  Last night his pain improved after Tylenol and tramadol.  Currently 2 out of 10  chest discomfort.  With further questioning, patient with history of pericarditis post MI in 2016.  Symptoms not classic for  pleuritic nature but somewhat similar.  He had a cold about a month ago.  I will check CRP and sed rate.  Trial of ibuprofen 800 mg 3 times daily for today.  Also on Voltaren gel.  Continue PPI.   2.  CAD -Cath as above showing stable disease - Echo with preserved LVEF  -Continue dual antiplatelet therapy with aspirin and Plavix -Continue Crestor and beta-blocker  3.  Sinus bradycardia At rate in 40s to 50s but does fell pulsation.  Rare PVC.  Continue Lopressor 50 mg twice daily. He will benefit from outpatient sleep study evaluation  4.  Hypertension -Continue current medical therapy   For questions or updates, please contact [place] Please consult [email or web address] for contact info under        Signed, Leanor Kail, PA  07/10/2022, 8:20 AM     Patient seen and examined and agree with Robbie Lis, PA as detailed above.   In brief, the patient is a 51 y.o. male with a history of CAD with a history of NSTEMI in 07/2015 s/p DES to LCX and OM2 and then CTO intervention with DES to distal RCA in 09/2021, hypertension, and hyperlipidemia who presented to [facility] with worsening exertional chest pain with concern for UA prompting transfer to [facility] for further evaluation.  Cath 07/09/22 with 95% acute marginal stenosis, known 100% RPAV (fills from L>R collaterals), patent RCA stent, and patent OM2 stent. Overall stable from prior cath. Currently, patient has ongoing chest discomfort of unclear etiology. Reports no relief with GI cocktail and did not respond to nitro. Has history of pericarditis and reports a viral illness a month ago (? Atypical pericarditis). Also with recent COVID exposure so will check swab just to rule out.  Denies recent immobility/long travel and patient is not hypoxic or tachycardic with lower suspicion of PE.  GEN: No acute distress.    Neck: No JVD Cardiac: Bradycardic, regular Respiratory: Clear to auscultation bilaterally. GI: Soft, nontender, non-distended  MS: No edema; No deformity. Neuro:  Nonfocal  Psych: Normal affect    Plan: -Agree with checking ESR and CRP -Will check COVID swab given recent exposure -Continue medical therapy for known CAD with ASA 93m daily, plavix 748mdaily, crestor 1036maily -Continue lisinopril 5mg38mily, metop 50mg58m, amlodipine 5mg d52my -If above work-up reassuring, can likely discharge home later today vs tomorrow pending pain control  HeatheGwyndolyn Kaufman

## 2022-07-11 ENCOUNTER — Encounter (HOSPITAL_COMMUNITY): Payer: Self-pay | Admitting: Cardiology

## 2022-07-11 DIAGNOSIS — I10 Essential (primary) hypertension: Secondary | ICD-10-CM

## 2022-07-11 DIAGNOSIS — R079 Chest pain, unspecified: Secondary | ICD-10-CM | POA: Diagnosis not present

## 2022-07-11 DIAGNOSIS — I25119 Atherosclerotic heart disease of native coronary artery with unspecified angina pectoris: Secondary | ICD-10-CM | POA: Diagnosis not present

## 2022-07-11 DIAGNOSIS — F17290 Nicotine dependence, other tobacco product, uncomplicated: Secondary | ICD-10-CM | POA: Diagnosis not present

## 2022-07-11 DIAGNOSIS — R001 Bradycardia, unspecified: Secondary | ICD-10-CM

## 2022-07-11 DIAGNOSIS — I25110 Atherosclerotic heart disease of native coronary artery with unstable angina pectoris: Secondary | ICD-10-CM | POA: Diagnosis not present

## 2022-07-11 DIAGNOSIS — Z7902 Long term (current) use of antithrombotics/antiplatelets: Secondary | ICD-10-CM | POA: Diagnosis not present

## 2022-07-11 DIAGNOSIS — Z20822 Contact with and (suspected) exposure to covid-19: Secondary | ICD-10-CM | POA: Diagnosis not present

## 2022-07-11 DIAGNOSIS — Z7982 Long term (current) use of aspirin: Secondary | ICD-10-CM | POA: Diagnosis not present

## 2022-07-11 DIAGNOSIS — F17210 Nicotine dependence, cigarettes, uncomplicated: Secondary | ICD-10-CM | POA: Diagnosis not present

## 2022-07-11 DIAGNOSIS — Z79899 Other long term (current) drug therapy: Secondary | ICD-10-CM | POA: Diagnosis not present

## 2022-07-11 HISTORY — DX: Essential (primary) hypertension: I10

## 2022-07-11 LAB — LIPOPROTEIN A (LPA): Lipoprotein (a): 71.4 nmol/L — ABNORMAL HIGH (ref <75.0)

## 2022-07-11 MED ORDER — DICLOFENAC SODIUM 1 % EX GEL
2.0000 g | Freq: Four times a day (QID) | CUTANEOUS | 1 refills | Status: DC
Start: 2022-07-11 — End: 2022-07-28

## 2022-07-11 MED ORDER — ACETAMINOPHEN 325 MG PO TABS: 650.0000 mg | ORAL_TABLET | ORAL | Status: DC | PRN

## 2022-07-11 NOTE — Discharge Summary (Addendum)
Discharge Summary    Patient ID: Alex Barnes MRN: [id number]; DOB: Jul 23, 1971  Admit date: 07/08/2022 Discharge date: 07/11/2022  PCP:  Caryl Bis, MD   [place] [facility] Cardiologist:  Carlyle Dolly, MD        Discharge Diagnoses    Principal Problem:   Chest pain of uncertain etiology, non cardiac Heart Cath stable Active Problems:   Hyperlipidemia   Bradycardia   CAD (coronary artery disease), native coronary artery   HTN (hypertension)    Diagnostic Studies/Procedures    Echo 07/09/22 IMPRESSIONS     1. Left ventricular ejection fraction, by estimation, is 60 to 65%. The  left ventricle has normal function. The left ventricle has no regional  wall motion abnormalities. There is mild left ventricular hypertrophy.  Left ventricular diastolic parameters  were normal.   2. Right ventricular systolic function is normal. The right ventricular  size is normal.   3. The mitral valve is normal in structure. No evidence of mitral valve  regurgitation. No evidence of mitral stenosis.   4. The aortic valve has an indeterminant number of cusps. Aortic valve  regurgitation is mild. No aortic stenosis is present.   Comparison(s): No significant change from prior study.   FINDINGS   Left Ventricle: Left ventricular ejection fraction, by estimation, is 60  to 65%. The left ventricle has normal function. The left ventricle has no  regional wall motion abnormalities. Definity contrast agent was given IV  to delineate the left ventricular   endocardial borders. The left ventricular internal cavity size was normal  in size. There is mild left ventricular hypertrophy. Left ventricular  diastolic parameters were normal.   Right Ventricle: The right ventricular size is normal. Right ventricular  systolic function is normal.   Left Atrium: Left atrial size was normal in size.   Right Atrium: Right atrial size was normal in size.   Pericardium: There is no  evidence of pericardial effusion.   Mitral Valve: The mitral valve is normal in structure. No evidence of  mitral valve regurgitation. No evidence of mitral valve stenosis.   Tricuspid Valve: The tricuspid valve is normal in structure. Tricuspid  valve regurgitation is not demonstrated. No evidence of tricuspid  stenosis.   Aortic Valve: The aortic valve has an indeterminant number of cusps.  Aortic valve regurgitation is mild. Aortic regurgitation PHT measures 405  msec. No aortic stenosis is present.   Pulmonic Valve: The pulmonic valve was normal in structure. Pulmonic valve  regurgitation is not visualized. No evidence of pulmonic stenosis.   Aorta: The aortic root is normal in size and structure.   Venous: The inferior vena cava was not well visualized.   IAS/Shunts: The interatrial septum was not well visualized.  _____________  Cardiac cath 07/09/22     Acute Mrg lesion is 95% stenosed.   RPAV lesion is 100% stenosed.   Previously placed Dist RCA stent of unknown type is  widely patent.   Previously placed 2nd Mrg stent of unknown type is  widely patent.   No obstructive disease in the large LAD Patent mid Circumflex/OM stent without restenosis Patent distal RCA stent.  Chronic occlusion right posterolateral branch which fills from left to right collaterals. The moderate caliber RV marginal branch is unchanged from last cath with severe ostial stenosis (this lesion could not be crossed during attempted PCI in 2022).  No change in coronary anatomy since last cath in 2022.  Chest pain has been continuous for  48 hours with negative troponin. Suspect chest pain is not cardiac related.    Recommendations: Continued medical management of CAD. Chest pain has been continuous for 48 hours with negative troponin. Suspect chest pain is not cardiac related. Will give a GI cocktail.    Diagnostic Dominance: Right    History of Present Illness     Alex Barnes is a 51 y.o. male  with extensive history of CAD with NSTEMI 07/2015 received DES to LCX and OM2, RCA CTO managed medically at that time. 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 Following this procedure symptoms much improved based on 12/2021 clinic note. History of HL with limited statin tolerance on repatha and crestor followed in lipid clinic, HTN presented 07/08/22 with chest pain to [facility].  With his chest pain and past CAD pt was transferred to [facility] for cardiac Cath.  Troponins were neg, EKG without acute changes in SR.    Placed on IV heparin.    Hospital Course     Consultants: none   Pt arrived at [facility] and underwent cardiac cath with similar results as 2022. Not felt to be cardiac pain.  Echo with normal EF and mild LVH.    Despite stable cath pt continued with chest pain.  NTG without relief, no relief with GI cocktail. Sed rate was normal and CRP.    His pain did improve with tylenol and tramadol.  He also tested neg for COVID.   Ibuprofen was added.    Continue with treatment for CAD with ASA, plavix, crestor and lisinopril, lopressor and amlodipine.    Today he has been seen by Dr. Johney Frame and found stable for discharge.  Ibuprofen without much improvement.   Has follow up 07/27/22.   Did the patient have an acute coronary syndrome (MI, NSTEMI, STEMI, etc) this admission?:  No                               Did the patient have a percutaneous coronary intervention (stent / angioplasty)?:  No.          _____________  Discharge Vitals Blood pressure 129/70, pulse (!) 46, temperature 97.8 F (36.6 C), temperature source Oral, resp. rate 19, height '5\' 10"'  (1.778 m), weight 133.2 kg, SpO2 95 %.  Filed Weights   07/08/22 1119 07/08/22 2144  Weight: 131.5 kg 133.2 kg   PER Dr. Johney Frame    General:Pleasant affect, NAD Skin:Warm and dry, brisk capillary refill HEENT:normocephalic, sclera clear, mucus membranes moist Neck:supple, no JVD, no bruits  Heart:S1S2 RRR without murmur, gallup, rub or click Lungs:clear without rales, rhonchi, or wheezes WIO:XBDZ, non tender, + BS, do not palpate liver spleen or masses Ext:no lower ext edema, 2+ pedal pulses, 2+ radial pulses Neuro:alert and oriented, MAE, follows commands, + facial symmetry  Labs & Radiologic Studies    CBC Recent Labs    07/09/22 0705 07/10/22 0432  WBC 7.6 8.6  HGB 15.5 15.9  HCT 46.5 47.8  MCV 92.4 92.8  PLT 292 329   Basic Metabolic Panel Recent Labs    07/09/22 0705 07/10/22 0432  NA 137 137  K 4.1 4.2  CL 106 104  CO2 24 24  GLUCOSE 101* 108*  BUN  10 12  CREATININE 0.98 1.09  CALCIUM 8.7* 9.2   Liver Function Tests Recent Labs    07/09/22 0705  AST 27  ALT 38  ALKPHOS 30*  BILITOT 0.6  PROT 6.4*  ALBUMIN 3.5   No results for input(s): "LIPASE", "AMYLASE" in the last 72 hours. High Sensitivity Troponin:   Recent Labs  Lab 07/08/22 1119 07/08/22 1315 07/09/22 0705  TROPONINIHS '11 11 11    ' BNP Invalid input(s): "POCBNP" D-Dimer No results for input(s): "DDIMER" in the last 72 hours. Hemoglobin A1C No results for input(s): "HGBA1C" in the last 72 hours. Fasting Lipid Panel Recent Labs    07/09/22 0705  CHOL 93  HDL 22*  LDLCALC 44  TRIG 133  CHOLHDL 4.2   Thyroid Function Tests No results for input(s): "TSH", "T4TOTAL", "T3FREE", "THYROIDAB" in the last 72 hours.  Invalid input(s): "FREET3" _____________  ECHOCARDIOGRAM COMPLETE  Result Date: 07/09/2022    ECHOCARDIOGRAM REPORT   Patient Name:   Alex Barnes Date of Exam: 07/09/2022 Medical Rec #:  [id number]      Height:       70.0 in Accession #:    [id number]     Weight:       293.6 lb Date of Birth:  18-Jun-1971      BSA:          2.457 m Patient Age:    51 years       BP:           132/56 mmHg Patient  Gender: M              HR:           58 bpm. Exam Location:  Inpatient Procedure: 2D Echo, Cardiac Doppler, Color Doppler and Intracardiac            Opacification Agent Indications:    Chest Pain  History:        Patient has prior history of Echocardiogram examinations, most                 recent 05/22/2021. Previous Myocardial Infarction and CAD.  Sonographer:    Eartha Inch Referring Phys: [id number] [place] Comments: Technically difficult study due to poor echo windows. Image acquisition challenging due to patient body habitus and Image acquisition challenging due to respiratory motion. IMPRESSIONS  1. Left ventricular ejection fraction, by estimation, is 60 to 65%. The left ventricle has normal function. The left ventricle has no regional wall motion abnormalities. There is mild left ventricular hypertrophy. Left ventricular diastolic parameters were normal.  2. Right ventricular systolic function is normal. The right ventricular size is normal.  3. The mitral valve is normal in structure. No evidence of mitral valve regurgitation. No evidence of mitral stenosis.  4. The aortic valve has an indeterminant number of cusps. Aortic valve regurgitation is mild. No aortic stenosis is present. Comparison(s): No significant change from prior study. FINDINGS  Left Ventricle: Left ventricular ejection fraction, by estimation, is 60 to 65%. The left ventricle has normal function. The left ventricle has no regional wall motion abnormalities. Definity contrast agent was given IV to delineate the left ventricular  endocardial borders. The left ventricular internal cavity size was normal in size. There is mild left ventricular hypertrophy. Left ventricular diastolic parameters were normal. Right Ventricle: The right ventricular size is normal. Right ventricular systolic function is normal. Left Atrium: Left atrial size was normal in size. Right Atrium: Right atrial size was  normal in size. Pericardium: There  is no evidence of pericardial effusion. Mitral Valve: The mitral valve is normal in structure. No evidence of mitral valve regurgitation. No evidence of mitral valve stenosis. Tricuspid Valve: The tricuspid valve is normal in structure. Tricuspid valve regurgitation is not demonstrated. No evidence of tricuspid stenosis. Aortic Valve: The aortic valve has an indeterminant number of cusps. Aortic valve regurgitation is mild. Aortic regurgitation PHT measures 405 msec. No aortic stenosis is present. Pulmonic Valve: The pulmonic valve was normal in structure. Pulmonic valve regurgitation is not visualized. No evidence of pulmonic stenosis. Aorta: The aortic root is normal in size and structure. Venous: The inferior vena cava was not well visualized. IAS/Shunts: The interatrial septum was not well visualized.  LEFT VENTRICLE PLAX 2D LVIDd:         4.40 cm      Diastology LVIDs:         3.70 cm      LV e' medial:    8.11 cm/s LV PW:         1.35 cm      LV E/e' medial:  9.5 LV IVS:        1.35 cm      LV e' lateral:   12.60 cm/s LVOT diam:     2.50 cm      LV E/e' lateral: 6.1 LV SV:         112 LV SV Index:   46 LVOT Area:     4.91 cm  LV Volumes (MOD) LV vol d, MOD A2C: 156.0 ml LV vol d, MOD A4C: 207.0 ml LV vol s, MOD A2C: 52.8 ml LV vol s, MOD A4C: 99.7 ml LV SV MOD A2C:     103.2 ml LV SV MOD A4C:     207.0 ml LV SV MOD BP:      109.1 ml RIGHT VENTRICLE RV S prime:     9.24 cm/s TAPSE (M-mode): 2.0 cm LEFT ATRIUM             Index        RIGHT ATRIUM           Index LA diam:        4.30 cm 1.75 cm/m   RA Area:     15.10 cm LA Vol (A2C):   55.2 ml 22.46 ml/m  RA Volume:   37.40 ml  15.22 ml/m LA Vol (A4C):   58.7 ml 23.89 ml/m LA Biplane Vol: 59.9 ml 24.38 ml/m  AORTIC VALVE LVOT Vmax:   103.00 cm/s LVOT Vmean:  76.700 cm/s LVOT VTI:    0.229 m AI PHT:      405 msec  AORTA Ao Root diam: 3.00 cm Ao Asc diam:  3.10 cm MITRAL VALVE MV Area (PHT): 3.81 cm    SHUNTS MV Decel Time: 199 msec    Systemic VTI:  0.23  m MV E velocity: 76.70 cm/s  Systemic Diam: 2.50 cm MV A velocity: 36.80 cm/s MV E/A ratio:  2.08 Kirk Ruths MD Electronically signed by Kirk Ruths MD Signature Date/Time: 07/09/2022/12:30:07 PM    Final    CARDIAC CATHETERIZATION  Result Date: 07/09/2022   Acute Mrg lesion is 95% stenosed.   RPAV lesion is 100% stenosed.   Previously placed Dist RCA stent of unknown type is  widely patent.   Previously placed 2nd Mrg stent of unknown type is  widely patent. No obstructive disease in the large LAD Patent mid Circumflex/OM stent without  restenosis Patent distal RCA stent.  Chronic occlusion right posterolateral branch which fills from left to right collaterals. The moderate caliber RV marginal branch is unchanged from last cath with severe ostial stenosis (this lesion could not be crossed during attempted PCI in 2022). No change in coronary anatomy since last cath in 2022. Chest pain has been continuous for 48 hours with negative troponin. Suspect chest pain is not cardiac related. Recommendations: Continued medical management of CAD. Chest pain has been continuous for 48 hours with negative troponin. Suspect chest pain is not cardiac related. Will give a GI cocktail.   DG Chest 2 View  Result Date: 07/08/2022 CLINICAL DATA:  pt states that he is here for some chest pain, states that he is also having some SOB EXAM: CHEST - 2 VIEW COMPARISON:  Chest radiograph 07/04/2017 FINDINGS: The cardiomediastinal contours are within normal limits. The lungs are clear. No pneumothorax or pleural effusion. No acute finding in the visualized skeleton. IMPRESSION: No acute cardiopulmonary process. Electronically Signed   By: Audie Pinto M.D.   On: 07/08/2022 11:48    Disposition   Pt is being discharged home today in good condition.  Follow-up Plans & Appointments     Follow-up Information     [place], Crista L[place], [place] Follow up on 07/27/2022.   Specialty: Cardiology Why: '@11' :20am for hosptial follow  up Contact information: [place] [id number] [phone]                  Discharge Medications   Allergies as of 07/11/2022   No Known Allergies      Medication List     TAKE these medications    acetaminophen 325 MG tablet Commonly known as: TYLENOL Take 2 tablets (650 mg total) by mouth every 4 (four) hours as needed for headache or mild pain.   amLODipine 5 MG tablet Commonly known as: NORVASC Take 1 tablet (5 mg total) by mouth daily.   aspirin 81 MG chewable tablet Chew 1 tablet (81 mg total) by mouth daily.   clopidogrel 75 MG tablet Commonly known as: Plavix Take 1 tablet (75 mg total) by mouth daily.   diclofenac Sodium 1 % Gel Commonly known as: VOLTAREN Apply 2 g topically 4 (four) times daily.   lisinopril 2.5 MG tablet Commonly known as: ZESTRIL TAKE 2 TABLETS BY MOUTH EVERY DAY   metoprolol tartrate 50 MG tablet Commonly known as: LOPRESSOR TAKE 1 TABLET BY MOUTH TWICE A DAY   nitroGLYCERIN 0.4 MG SL tablet Commonly known as: NITROSTAT Place 1 tablet (0.4 mg total) under the tongue every 5 (five) minutes as needed for chest pain.   pantoprazole 40 MG tablet Commonly known as: PROTONIX Take 40 mg by mouth daily.   Repatha SureClick 201 MG/ML Soaj Generic drug: Evolocumab Inject 140 mg into the skin every 14 (fourteen) days.   rosuvastatin 10 MG tablet Commonly known as: CRESTOR Take 1 tablet (10 mg total) by mouth daily.   testosterone cypionate 200 MG/ML injection Commonly known as: DEPOTESTOSTERONE CYPIONATE Inject 200 mg into the muscle every 14 (fourteen) days.           Outstanding Labs/Studies     Duration of Discharge Encounter   Greater than 30 minutes including physician time.  Signed, Cecilie Kicks, NP 07/11/2022, 11:28 AM  Patient seen and examined and agree with Cecilie Kicks, NP as detailed above.    In brief, the patient is a 51 y.o. male with a history  of CAD with a history  of NSTEMI in 07/2015 s/p DES to LCX and OM2 and then CTO intervention with DES to distal RCA in 09/2021, hypertension, and hyperlipidemia who presented to [facility] with worsening exertional chest pain with concern for UA prompting transfer to [facility] for further evaluation.   Cath 07/09/22 with 95% acute marginal stenosis, known 100% RPAV (fills from L>R collaterals), patent RCA stent, and patent OM2 stent. Overall stable from prior cath. Unclear etiology of the pain. Reports no relief with GI cocktail and did not respond to nitro. ESR/CRP normal. COVID negative. Feels better today and states he is ready to go home.   GEN: No acute distress.   Neck: No JVD Cardiac: Bradycardic, regular Respiratory: Clear to auscultation bilaterally. GI: Soft, nontender, non-distended  MS: No edema; No deformity. Neuro:  Nonfocal  Psych: Normal affect     Plan: -Cath with stable disease -ESR/CRP normal, COVID negative -Continue medical therapy for known CAD with ASA 28m daily, plavix 767mdaily, crestor 1069maily -Continue lisinopril 5mg69mily, metop 50mg74m, amlodipine 5mg d16my -Feels better and will discharge home today   HeatheGwyndolyn Kaufman

## 2022-07-14 MED FILL — Fentanyl Citrate Preservative Free (PF) Inj 100 MCG/2ML: INTRAMUSCULAR | Qty: 50 | Status: AC

## 2022-07-17 DIAGNOSIS — I25119 Atherosclerotic heart disease of native coronary artery with unspecified angina pectoris: Secondary | ICD-10-CM | POA: Diagnosis not present

## 2022-07-17 DIAGNOSIS — Z6841 Body Mass Index (BMI) 40.0 and over, adult: Secondary | ICD-10-CM | POA: Diagnosis not present

## 2022-07-17 DIAGNOSIS — Z23 Encounter for immunization: Secondary | ICD-10-CM | POA: Diagnosis not present

## 2022-07-17 DIAGNOSIS — E291 Testicular hypofunction: Secondary | ICD-10-CM | POA: Diagnosis not present

## 2022-07-25 DIAGNOSIS — G4733 Obstructive sleep apnea (adult) (pediatric): Secondary | ICD-10-CM | POA: Diagnosis not present

## 2022-07-26 NOTE — Progress Notes (Signed)
Cardiology Clinic Note   Patient Name: Alex Barnes Date of Encounter: 07/28/2022  Primary Care Provider:  Richardean Chimera, MD Primary Cardiologist:  Dina Rich, MD  Patient Profile    Alex Barnes presents in the clinic today for follow-up evaluation of his chest discomfort.  Past Medical History    Past Medical History:  Diagnosis Date   CAD (coronary artery disease)    cath 9/26 & 07/31/2015 100% total occlusion of post atrio branch of RCA, LV supplied by collaterals from the L atrial recurrent branch of distal LCx, 75-80% mid to distal LCx stenosis, high grade complex stenosis of OM2, patent LAD and RI, EF 55-60%. On 9/8, single DES to midLCx extending into OM2.   Headache    "weekly" (07/29/2015)   HTN (hypertension) 07/11/2022   Hypercholesteremia    NSTEMI (non-ST elevated myocardial infarction) (HCC) 07/28/2015   Past Surgical History:  Procedure Laterality Date   CARDIAC CATHETERIZATION  2006; 07/29/2015   CARDIAC CATHETERIZATION N/A 07/29/2015   Procedure: Left Heart Cath and Coronary Angiography;  Surgeon: Lyn Records, MD;  Location[facility] INVASIVE CV LAB;  Service: Cardiovascular;  Laterality: N/A;   CARDIAC CATHETERIZATION N/A 07/31/2015   Procedure: Coronary Stent Intervention;  Surgeon: Iran Ouch, MD;  Location: [facility] INVASIVE CV LAB;  Service: Cardiovascular;  Laterality: N/A;   CARDIAC CATHETERIZATION N/A 07/31/2015   Procedure: Left Heart Cath and Coronary Angiography;  Surgeon: Iran Ouch, MD;  Location: [facility] INVASIVE CV LAB;  Service: Cardiovascular;  Laterality: N/A;   CHOLECYSTECTOMY     CORONARY CTO INTERVENTION N/A 09/10/2021   Procedure: CORONARY CTO INTERVENTION;  Surgeon: Swaziland, Peter M, MD;  Location: [facility] INVASIVE CV LAB;  Service: Cardiovascular;  Laterality: N/A;   INTRAVASCULAR ULTRASOUND/IVUS N/A 09/10/2021   Procedure: Intravascular Ultrasound/IVUS;  Surgeon: Swaziland, Peter M, MD;  Location: [facility] INVASIVE CV LAB;  Service: Cardiovascular;   Laterality: N/A;   LEFT HEART CATH AND CORONARY ANGIOGRAPHY N/A 08/29/2021   Procedure: LEFT HEART CATH AND CORONARY ANGIOGRAPHY;  Surgeon: Swaziland, Peter M, MD;  Location: [facility] INVASIVE CV LAB;  Service: Cardiovascular;  Laterality: N/A;   LEFT HEART CATH AND CORONARY ANGIOGRAPHY N/A 07/09/2022   Procedure: LEFT HEART CATH AND CORONARY ANGIOGRAPHY;  Surgeon: Kathleene Hazel, MD;  Location: [facility] INVASIVE CV LAB;  Service: Cardiovascular;  Laterality: N/A;   TONSILLECTOMY  ~ 1989   WISDOM TOOTH EXTRACTION  1993    Allergies  No Known Allergies  History of Present Illness    Alex Barnes has a PMH of coronary artery disease with NSTEMI 9/16.  He underwent PCI with DES to his circumflex and OM 2.  He was noted to have RCA CTO which meds managed medically.  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  He was noted to have chronic angina from right CTO failed medical therapy and was referred to Dr. Swaziland.  He underwent PCI to his RCA CTO 11/22.  He had an unsuccessful PCI of the RV marginal branch due to acute angle takeoff and inability to cross wire.  Following his procedure his symptoms improved based on his 2/23 clinic note.  He was placed on rosuvastatin and Repatha and followed with the lipid clinic.  He presented 07/08/2022 with chest pain to [facility].  His chest pain and presentation prompted transfer to [facility] for cardiac catheterization.  His troponins were negative, EKG showed no acute changes in sinus rhythm.  He was placed on IV  heparin.  He underwent cardiac catheterization and his cardiac cath showed similar results to his 2022 catheterization.  His chest pain was not felt to be cardiac in nature.  His echo showed normal EF and mild LVH.  Despite his cath his chest pain continued.  Nitroglycerin did not provide relief.  He did not receive relief from GI cocktail.  His sedimentation rate was normal and CRP were normal.  His pain improved with Tylenol and tramadol.  He  was negative for COVID infection.  Ibuprofen was added to his medication regimen.  He presents to the clinic today for follow-up evaluation and states he has felt better this week.  He did take out and air conditioning window unit which caused some chest discomfort.  His chest discomfort has responded well to as needed ibuprofen.  We reviewed his cardiac catheterization and echocardiogram.  He and his wife expressed understanding.  He has had recent lab work drawn.  His LDL cholesterol was 44 on 11/05/863.  I will continue his current medication regimen, have him increase his physical activity as tolerated and give the salty 6 diet sheet.  We will plan follow-up in 3 to 4 months.  Today he denies chest pain, shortness of breath, lower extremity edema, fatigue, palpitations, melena, hematuria, hemoptysis, diaphoresis, weakness, presyncope, syncope, orthopnea, and PND.   Home Medications    Prior to Admission medications   Medication Sig Start Date End Date Taking? Authorizing Provider  acetaminophen (TYLENOL) 325 MG tablet Take 2 tablets (650 mg total) by mouth every 4 (four) hours as needed for headache or mild pain. 07/11/22   Leone Brand, NP  amLODipine (NORVASC) 5 MG tablet Take 1 tablet (5 mg total) by mouth daily. 08/28/21   Swaziland, Peter M, MD  aspirin 81 MG chewable tablet Chew 1 tablet (81 mg total) by mouth daily. 08/01/15   Azalee Course, PA  clopidogrel (PLAVIX) 75 MG tablet Take 1 tablet (75 mg total) by mouth daily. 08/29/21 08/29/22  [place]il[place], Peter M, MD  diclofenac Sodium (VOLTAREN) 1 % GEL Apply 2 g topically 4 (four) times daily. 07/11/22   Leone Brand, NP  Evolocumab (REPATHA SURECLICK) 140 MG/ML SOAJ Inject 140 mg into the skin every 14 (fourteen) days. 10/16/21   Antoine Poche, MD  lisinopril (ZESTRIL) 2.5 MG tablet TAKE 2 TABLETS BY MOUTH EVERY DAY Patient taking differently: Take 5 mg by mouth daily. 05/20/22   Antoine Poche, MD  metoprolol tartrate (LOPRESSOR) 50 MG  tablet TAKE 1 TABLET BY MOUTH TWICE A DAY Patient taking differently: Take 50 mg by mouth 2 (two) times daily. 06/17/22   [place]aziland, Peter M, MD  nitroGLYCERIN (NITROSTAT) 0.4 MG SL tablet Place 1 tablet (0.4 mg total) under the tongue every 5 (five) minutes as needed for chest pain. 03/11/21   Netta Neat., NP  pantoprazole (PROTONIX) 40 MG tablet Take 40 mg by mouth daily.    [provider]  rosuvastatin (CRESTOR) 10 MG tablet Take 1 tablet (10 mg total) by mouth daily. 05/18/22 05/13/23  [place]azil[place], Peter M, MD  testosterone cypionate (DEPOTESTOSTERONE CYPIONATE) 200 MG/ML injection Inject 200 mg into the muscle every 14 (fourteen) days. 11/26/18   [provider]    Family History    Family History  Problem Relation Age of Onset   Cancer Father    He indicated that his mother is alive. He indicated that his father is deceased. He indicated that his sister is alive.  Social History  Social History   Socioeconomic History   Marital status: Married    Spouse name: Not on file   Number of children: Not on file   Years of education: Not on file   Highest education level: Not on file  Occupational History   Not on file  Tobacco Use   Smoking status: Some Days    Packs/day: 0.50    Years: 10.00    Total pack years: 5.00    Types: Cigarettes, Cigars    Last attempt to quit: 03/08/2020    Years since quitting: 2.3   Smokeless tobacco: Former   Tobacco comments:    "quit smoking cigarettes in 1998"  Vaping Use   Vaping Use: Never used  Substance and Sexual Activity   Alcohol use: Yes    Alcohol/week: 0.0 standard drinks of alcohol    Comment: 6 drinks weekly    Drug use: No   Sexual activity: Yes  Other Topics Concern   Not on file  Social History Narrative   Not on file   Social Determinants of Health   Financial Resource Strain: Not on file  Food Insecurity: No Food Insecurity (07/09/2022)   Hunger Vital Sign    Worried About Running Out of Food in the  Last Year: Never true    Ran Out of Food in the Last Year: Never true  Transportation Needs: No Transportation Needs (07/09/2022)   PRAPARE - Administrator, Civil Service (Medical): No    Lack of Transportation (Non-Medical): No  Physical Activity: Not on file  Stress: Not on file  Social Connections: Not on file  Intimate Partner Violence: Not At Risk (07/09/2022)   Humiliation, Afraid, Rape, and Kick questionnaire    Fear of Current or Ex-Partner: No    Emotionally Abused: No    Physically Abused: No    Sexually Abused: No     Review of Systems    General:  No chills, fever, night sweats or weight changes.  Cardiovascular:  No chest pain, dyspnea on exertion, edema, orthopnea, palpitations, paroxysmal nocturnal dyspnea. Dermatological: No rash, lesions/masses Respiratory: No cough, dyspnea Urologic: No hematuria, dysuria Abdominal:   No nausea, vomiting, diarrhea, bright red blood per rectum, melena, or hematemesis Neurologic:  No visual changes, wkns, changes in mental status. All other systems reviewed and are otherwise negative except as noted above.  Physical Exam    VS:  BP 116/62   Pulse 62   Ht 5\' 10"  (1.778 m)   Wt 289 lb 9.6 oz (131.4 kg)   SpO2 98%   BMI 41.55 kg/m  , BMI Body mass index is 41.55 kg/m. GEN: Well nourished, well developed, in no acute distress. HEENT: normal. Neck: Supple, no JVD, carotid bruits, or masses. Cardiac: RRR, no murmurs, rubs, or gallops. No clubbing, cyanosis, edema.  Radials/DP/PT 2+ and equal bilaterally.  Respiratory:  Respirations regular and unlabored, clear to auscultation bilaterally. GI: Soft, nontender, nondistended, BS + x 4. MS: no deformity or atrophy. Skin: warm and dry, no rash. Neuro:  Strength and sensation are intact. Psych: Normal affect.  Accessory Clinical Findings    Recent Labs: 07/09/2022: ALT 38 07/10/2022: BUN 12; Creatinine, Ser 1.09; Hemoglobin 15.9; Platelets 298; Potassium 4.2; Sodium 137    Recent Lipid Panel    Component Value Date/Time   CHOL 93 07/09/2022 0705   CHOL 148 08/28/2021 1041   TRIG 133 07/09/2022 0705   HDL 22 (L) 07/09/2022 0705   HDL 35 (L) 08/28/2021 1041  CHOLHDL 4.2 07/09/2022 0705   VLDL 27 07/09/2022 0705   LDLCALC 44 07/09/2022 0705   LDLCALC 97 08/28/2021 1041         ECG personally reviewed by me today-none today  Cardiac catheterization 07/09/2022    Acute Mrg lesion is 95% stenosed.   RPAV lesion is 100% stenosed.   Previously placed Dist RCA stent of unknown type is  widely patent.   Previously placed 2nd Mrg stent of unknown type is  widely patent.   No obstructive disease in the large LAD Patent mid Circumflex/OM stent without restenosis Patent distal RCA stent.  Chronic occlusion right posterolateral branch which fills from left to right collaterals. The moderate caliber RV marginal branch is unchanged from last cath with severe ostial stenosis (this lesion could not be crossed during attempted PCI in 2022).  No change in coronary anatomy since last cath in 2022.  Chest pain has been continuous for 48 hours with negative troponin. Suspect chest pain is not cardiac related.    Recommendations: Continued medical management of CAD. Chest pain has been continuous for 48 hours with negative troponin. Suspect chest pain is not cardiac related. Will give a GI cocktail.   Diagnostic Dominance: Right  Intervention   Assessment & Plan   1.  Chest pain of uncertain etiology-underwent cardiac catheterization on 07/08/2022.  No new coronary findings were identified.  Cardiac catheterization similar to previous 2022 findings.  Medical management was recommended.  Noted relief with tramadol and Tylenol.  Ibuprofen was added to his medication regimen. Continue ibuprofen as needed  Coronary artery disease-no chest pain today.  NSTEMI 9/16 with PCI and DES to his circumflex and OM 2.  He was noted to have CTO of his RCA.  He later underwent  catheterization by Dr. Swaziland who was unable to cross RCA CTO. Continue amlodipine, aspirin, lisinopril, metoprolol, rosuvastatin, Repatha Heart healthy low-sodium diet-salty 6 given Increase physical activity as tolerated  Hyperlipidemia-LDL 44 on 07/09/22 Continue Repatha, rosuvastatin Heart healthy low-sodium high-fiber diet Increase physical activity as tolerated  Bradycardia-heart rate today 62 BPM.  Denies episodes of lightheadedness, presyncope and syncope.  Blood pressure well controlled. Continue metoprolol   Essential hypertension-BP today 116/62. Continue metoprolol, amlodipine, lisinopril Heart healthy low-sodium diet-salty 6 given Increase physical activity as tolerated  Disposition: Follow-up with Dr. Swaziland in 3-4 months.   Thomasene Ripple. Terryn Redner NP-C     07/28/2022, 2:22 PM [facility] [address] Office [phone] Fax [phone]  Notice: This dictation was prepared with Dragon dictation along with smaller phrase technology. Any transcriptional errors that result from this process are unintentional and may not be corrected upon review.  I spent 14 minutes examining this patient, reviewing medications, and using patient centered shared decision making involving her cardiac care.  Prior to her visit I spent greater than 20 minutes reviewing her past medical history,  medications, and prior cardiac tests.

## 2022-07-27 ENCOUNTER — Ambulatory Visit: Payer: BC Managed Care – PPO | Admitting: Physician Assistant

## 2022-07-28 ENCOUNTER — Ambulatory Visit: Payer: BC Managed Care – PPO | Attending: Physician Assistant | Admitting: General Practice

## 2022-07-28 ENCOUNTER — Ambulatory Visit: Payer: BC Managed Care – PPO | Admitting: Physician Assistant

## 2022-07-28 ENCOUNTER — Encounter: Payer: Self-pay | Admitting: General Practice

## 2022-07-28 VITALS — BP 116/62 | HR 62 | Ht 70.0 in | Wt 289.6 lb

## 2022-07-28 DIAGNOSIS — I2510 Atherosclerotic heart disease of native coronary artery without angina pectoris: Secondary | ICD-10-CM | POA: Diagnosis not present

## 2022-07-28 DIAGNOSIS — I10 Essential (primary) hypertension: Secondary | ICD-10-CM

## 2022-07-28 DIAGNOSIS — R001 Bradycardia, unspecified: Secondary | ICD-10-CM

## 2022-07-28 DIAGNOSIS — R079 Chest pain, unspecified: Secondary | ICD-10-CM | POA: Diagnosis not present

## 2022-07-28 DIAGNOSIS — E782 Mixed hyperlipidemia: Secondary | ICD-10-CM | POA: Diagnosis not present

## 2022-07-28 NOTE — Patient Instructions (Signed)
Medication Instructions:  The current medical regimen is effective;  continue present plan and medications as directed. Please refer to the Current Medication list given to you today.  *If you need a refill on your cardiac medications before your next appointment, please call your pharmacy*  Lab Work: NONE  Other Instructions PLEASE READ AND FOLLOW ATTACHED  SALTY 6  MAY [place]: At [facility], you and your health needs are our priority.  As part of our continuing mission to provide you with exceptional heart care, we have created designated Provider Care Teams.  These Care Teams include your primary Cardiologist (physician) and Advanced Practice Providers (APPs -  Physician Assistants and Nurse Practitioners) who all work together to provide you with the care you need, when you need it.  Your next appointment:   3-4 month(s)  The format for your next appointment:   In Person  Provider:   Peter Martinique, MD    Important Information About Sugar

## 2022-07-29 DIAGNOSIS — G4733 Obstructive sleep apnea (adult) (pediatric): Secondary | ICD-10-CM | POA: Diagnosis not present

## 2022-07-30 DIAGNOSIS — E291 Testicular hypofunction: Secondary | ICD-10-CM | POA: Diagnosis not present

## 2022-08-09 ENCOUNTER — Other Ambulatory Visit: Payer: Self-pay | Admitting: Cardiology

## 2022-08-13 DIAGNOSIS — E291 Testicular hypofunction: Secondary | ICD-10-CM | POA: Diagnosis not present

## 2022-08-13 DIAGNOSIS — R030 Elevated blood-pressure reading, without diagnosis of hypertension: Secondary | ICD-10-CM | POA: Diagnosis not present

## 2022-08-24 DIAGNOSIS — G4733 Obstructive sleep apnea (adult) (pediatric): Secondary | ICD-10-CM | POA: Diagnosis not present

## 2022-08-27 DIAGNOSIS — E291 Testicular hypofunction: Secondary | ICD-10-CM | POA: Diagnosis not present

## 2022-08-31 ENCOUNTER — Telehealth: Payer: Self-pay | Admitting: Cardiology

## 2022-08-31 MED ORDER — CLOPIDOGREL BISULFATE 75 MG PO TABS: 75.0000 mg | ORAL_TABLET | Freq: Every day | ORAL | 3 refills | Status: DC

## 2022-08-31 NOTE — Telephone Encounter (Signed)
Refill complete 

## 2022-08-31 NOTE — Telephone Encounter (Signed)
*  STAT* If patient is at the pharmacy, call can be transferred to refill team.   1. Which medications need to be refilled? (please list name of each medication and dose if known) clopidogrel (PLAVIX) 75 MG tablet  2. Which pharmacy/location (including street and city if local pharmacy) is medication to be sent to?  [id number] - [place], [place] - [place]  3. Do they need a 30 day or 90 day supply? [place]

## 2022-09-11 DIAGNOSIS — E291 Testicular hypofunction: Secondary | ICD-10-CM | POA: Diagnosis not present

## 2022-09-24 DIAGNOSIS — G4733 Obstructive sleep apnea (adult) (pediatric): Secondary | ICD-10-CM | POA: Diagnosis not present

## 2022-09-28 DIAGNOSIS — E291 Testicular hypofunction: Secondary | ICD-10-CM | POA: Diagnosis not present

## 2022-10-12 ENCOUNTER — Other Ambulatory Visit: Payer: Self-pay | Admitting: Cardiology

## 2022-10-12 DIAGNOSIS — E291 Testicular hypofunction: Secondary | ICD-10-CM | POA: Diagnosis not present

## 2022-10-29 DIAGNOSIS — E291 Testicular hypofunction: Secondary | ICD-10-CM | POA: Diagnosis not present

## 2022-11-01 ENCOUNTER — Other Ambulatory Visit: Payer: Self-pay | Admitting: Cardiology

## 2022-11-17 NOTE — Progress Notes (Signed)
Interventional cardiology note:   Date:  12/30/2021   ID:  Alex Barnes, DOB [birth year], MRN [id number]  PCP:  Caryl Bis, MD  Cardiologist:   Hattie Aguinaldo Martinique, MD   Chief Complaint  Patient presents with   Coronary Artery Disease       History of Present Illness: Alex Barnes is a 52 y.o. male who is seen for follow up CAD. He had a Cardiac catheterization in 2016 with totally occluded RCA PL branch as well as significant LCx and OM disease.  He had DES to LCx and OM 2, PL occlusion managed medically. Echocardiogram 2016 LVEF 55 to 60%, no WMA's. Treated for post MI pericarditis with colchicine aspirin and NSAIDs. On follow up he had persistent chest pain despite medical therapy. Last Myoview in 2018 showed inferior infarct with peri infarct ischemia and normal EF at good exercise tolerance. Echo in July showed Normal LV function. He apparently had cardiac cath in May 2021 at [facility] showing occlusion of RCA without any new disease.   When seen previously he continued to have significant lifestyle limiting chest pain. For this reason he underwent CTO PCI of the distal RCA on 09/10/21. This was successful. Unable to pass a wire into the RV marginal branch.   On subsequent follow he noted that his anginal symptoms were markedly better.  He is able to do things now that he couldn't before and he feels it is life changing.   He presented 07/08/2022 with chest pain to [facility].  His chest pain and presentation prompted transfer to [facility] for cardiac catheterization.  His troponins were negative, EKG showed no acute changes in sinus rhythm.   He underwent cardiac catheterization and his cardiac cath showed similar results to his 2022 catheterization.  His chest pain was not felt to be cardiac in nature.  His echo showed normal EF and mild LVH.  Despite his cath his chest pain continued.  Nitroglycerin did not provide relief.  He did not receive relief from GI  cocktail.  His sedimentation rate was normal and CRP were normal.  His pain improved with Tylenol and tramadol.   When seen back in office his chest pain was better with prn NSAIDs.   On follow up he is doing well. He denies any chest pain. No SOB. He did lose about 15 lbs but gained more back over the holidays. He is walking regularly. Feels very well.   Past Medical History:  Diagnosis Date   CAD (coronary artery disease)    cath 9/26 & 07/31/2015 100% total occlusion of post atrio branch of RCA, LV supplied by collaterals from the L atrial recurrent branch of distal LCx, 75-80% mid to distal LCx stenosis, high grade complex stenosis of OM2, patent LAD and RI, EF 55-60%. On 9/8, single DES to midLCx extending into OM2.   Headache    "weekly" (07/29/2015)   Hypercholesteremia    NSTEMI (non-ST elevated myocardial infarction) ([place]) 07/28/2015    Past Surgical History:  Procedure Laterality Date   CARDIAC CATHETERIZATION  2006; 07/29/2015   CARDIAC CATHETERIZATION N/A 07/29/2015   Procedure: Left Heart Cath and Coronary Angiography;  Surgeon: Belva Crome, MD;  Location: [place] [facility];  Service: Cardiovascular;  Laterality: N/A;   CARDIAC CATHETERIZATION N/A 07/31/2015   Procedure: Coronary Stent Intervention;  Surgeon: Wellington Hampshire, MD;  Location: [place] [facility];  Service: Cardiovascular;  Laterality: N/A;   CARDIAC CATHETERIZATION N/A  07/31/2015   Procedure: Left Heart Cath and Coronary Angiography;  Surgeon: Iran Ouch, MD;  Location: [facility] INVASIVE CV LAB;  Service: Cardiovascular;  Laterality: N/A;   CHOLECYSTECTOMY     CORONARY CTO INTERVENTION N/A 09/10/2021   Procedure: CORONARY CTO INTERVENTION;  Surgeon: [place], Emara Lichter M, MD;  Location: [facility] INVASIVE CV LAB;  Service: Cardiovascular;  Laterality: N/A;   INTRAVASCULAR ULTRASOUND/IVUS N/A 09/10/2021   Procedure: Intravascular Ultrasound/IVUS;  Surgeon: [place], Raisa Ditto M, MD;  Location: [facility] INVASIVE CV LAB;  Service:  Cardiovascular;  Laterality: N/A;   LEFT HEART CATH AND CORONARY ANGIOGRAPHY N/A 08/29/2021   Procedure: LEFT HEART CATH AND CORONARY ANGIOGRAPHY;  Surgeon: [place], Ronni Osterberg M, MD;  Location: [facility] INVASIVE CV LAB;  Service: Cardiovascular;  Laterality: N/A;   TONSILLECTOMY  ~ 1989   WISDOM TOOTH EXTRACTION  1993     Current Outpatient Medications  Medication Sig Dispense Refill   amLODipine (NORVASC) 5 MG tablet Take 1 tablet (5 mg total) by mouth daily. 90 tablet 3   aspirin 81 MG chewable tablet Chew 1 tablet (81 mg total) by mouth daily.     clopidogrel (PLAVIX) 75 MG tablet Take 1 tablet (75 mg total) by mouth daily. 90 tablet 3   Evolocumab (REPATHA SURECLICK) 140 MG/ML SOAJ Inject 140 mg into the skin every 14 (fourteen) days. 2 mL 11   ezetimibe (ZETIA) 10 MG tablet Take 10 mg by mouth daily.     lisinopril (ZESTRIL) 2.5 MG tablet TAKE 2 TABLETS BY MOUTH EVERY DAY 180 tablet 2   metoprolol tartrate (LOPRESSOR) 50 MG tablet TAKE 1 TABLET BY MOUTH TWICE A DAY 60 tablet 0   nitroGLYCERIN (NITROSTAT) 0.4 MG SL tablet Place 1 tablet (0.4 mg total) under the tongue every 5 (five) minutes as needed for chest pain. 25 tablet 3   pantoprazole (PROTONIX) 40 MG tablet Take 40 mg by mouth daily.     rosuvastatin (CRESTOR) 10 MG tablet Take 1 tablet (10 mg total) by mouth daily. 90 tablet 3   testosterone cypionate (DEPOTESTOSTERONE CYPIONATE) 200 MG/ML injection Inject 200 mg into the muscle every 14 (fourteen) days.     No current facility-administered medications for this visit.    Allergies:   Patient has no known allergies.    Social History:  The patient  reports that he has been smoking cigarettes and cigars. He has a 5.00 pack-year smoking history. He has quit using smokeless tobacco. He reports current alcohol use. He reports that he does not use drugs.   Family History:  The patient's family history includes Cancer in his father.    ROS:  Please see the history of present illness.    Otherwise, review of systems are positive for none.   All other systems are reviewed and negative.    PHYSICAL EXAM: VS:  BP 136/78   Pulse 72   Ht 5\' 10"  (1.778 m)   Wt 286 lb 3.2 oz (129.8 kg)   BMI 41.07 kg/m  , BMI Body mass index is 41.07 kg/m. GEN: Well nourished, overweight, in no acute distress HEENT: normal Neck: no JVD, carotid bruits, or masses Cardiac: RRR; no murmurs, rubs, or gallops,no edema  Respiratory:  clear to auscultation bilaterally, normal work of breathing GI: soft, nontender, nondistended, + BS MS: no deformity or atrophy Skin: warm and dry, no rash Neuro:  Strength and sensation are intact Psych: euthymic mood, full affect   EKG:  EKG is not ordered today.   Recent Labs: 09/03/2021:  ALT 152 09/11/2021: BUN 11; Creatinine, Ser 1.01; Hemoglobin 14.2; Platelets 335; Potassium 3.9; Sodium 135   Dated 03/03/21: cholesterol 222, triglycerides 151, HDL 38, LDL 156. Normal CBC, CMET, TSH Dated 11/16/22: cholesterol 117, triglycerides 101, HDL 37, CMET with AST 41, ALT 75. Otherwise normal. A1c 6.2%.     Lipid Panel    Component Value Date/Time   CHOL 121 09/03/2021 1538   CHOL 148 08/28/2021 1041   TRIG 114 09/03/2021 1538   HDL 31 (L) 09/03/2021 1538   HDL 35 (L) 08/28/2021 1041   CHOLHDL 3.9 09/03/2021 1538   VLDL 23 09/03/2021 1538   LDLCALC 67 09/03/2021 1538   LDLCALC 97 08/28/2021 1041      Wt Readings from Last 3 Encounters:  12/30/21 286 lb 3.2 oz (129.8 kg)  10/01/21 281 lb 3.2 oz (127.6 kg)  09/30/21 278 lb 12.8 oz (126.5 kg)      Other studies Reviewed: Additional studies/ records that were reviewed today include: .  Echo 05/22/21: IMPRESSIONS     1. Left ventricular ejection fraction, by estimation, is 60 to 65%. The  left ventricle has normal function. The left ventricle has no regional  wall motion abnormalities. There is mild left ventricular hypertrophy.  Left ventricular diastolic parameters  are consistent with Grade I  diastolic dysfunction (impaired relaxation).   2. Right ventricular systolic function is normal. The right ventricular  size is normal.   3. The mitral valve is normal in structure. No evidence of mitral valve  regurgitation. No evidence of mitral stenosis.   4. The aortic valve has an indeterminant number of cusps. Aortic valve  regurgitation is mild.   Comparison(s): Previous Echo showed LV EF 55-60%, no RWMA, mild LVH.   CTO PCI 09/10/21: Procedures  CORONARY CTO INTERVENTION  Intravascular Ultrasound/IVUS   Conclusion      Acute Mrg lesion is 95% stenosed.   Dist RCA lesion is 100% stenosed.   Non-stenotic 2nd Mrg lesion was previously treated.   A drug-eluting stent was successfully placed using a SYNERGY XD [id number].   Post intervention, there is a 0% residual stenosis.   Successful CTO PCI of the distal RCA with IVUS guidance and DES x 1 Unsuccessful PCI of the RV marginal branch due to acute angled take off and inability to cross with a wire.    Plan: DAPT for one year. Anticipate DC tomorrow.   Diagnostic Dominance: Right Intervention   Cardiac catheterization 07/09/2022     Acute Mrg lesion is 95% stenosed.   RPAV lesion is 100% stenosed.   Previously placed Dist RCA stent of unknown type is  widely patent.   Previously placed 2nd Mrg stent of unknown type is  widely patent.   No obstructive disease in the large LAD Patent mid Circumflex/OM stent without restenosis Patent distal RCA stent.  Chronic occlusion right posterolateral branch which fills from left to right collaterals. The moderate caliber RV marginal branch is unchanged from last cath with severe ostial stenosis (this lesion could not be crossed during attempted PCI in 2022).  No change in coronary anatomy since last cath in 2022.  Chest pain has been continuous for 48 hours with negative troponin. Suspect chest pain is not cardiac related.    Recommendations: Continued medical management of CAD. Chest  pain has been continuous for 48 hours with negative troponin. Suspect chest pain is not cardiac related. Will give a GI cocktail.    Diagnostic Dominance: Right  Intervention   Echo 07/09/22:  IMPRESSIONS     1. Left ventricular ejection fraction, by estimation, is 60 to 65%. The  left ventricle has normal function. The left ventricle has no regional  wall motion abnormalities. There is mild left ventricular hypertrophy.  Left ventricular diastolic parameters  were normal.   2. Right ventricular systolic function is normal. The right ventricular  size is normal.   3. The mitral valve is normal in structure. No evidence of mitral valve  regurgitation. No evidence of mitral stenosis.   4. The aortic valve has an indeterminant number of cusps. Aortic valve  regurgitation is mild. No aortic stenosis is present.   Comparison(s): No significant change from prior study.    ASSESSMENT AND PLAN:  1.  CAD  s/p remote stent of OM in 2016. S/p successful CTO PCI of the distal RCA in 2022.  Now class 1. Cardiac cath repeat in September 2023 was stable.  Can discontinue Plavix at this point. Continue other therapy.  2. Hypercholesterolemia. Now on Repatha and Crestor 10 mg daily. Lipids are at goal.   3. HTN controlled.   Disposition:  follow up in 6 months.  Signed, Wylie Russon [place], MD  12/30/2021 2:00 PM    [facility] [address] [place], [place], [address] Phone [phone], Fax [phone]

## 2022-11-27 ENCOUNTER — Encounter: Payer: Self-pay | Admitting: Cardiology

## 2022-11-27 ENCOUNTER — Ambulatory Visit: Payer: BC Managed Care – PPO | Attending: Cardiology | Admitting: Cardiology

## 2022-11-27 VITALS — BP 118/64 | HR 72 | Ht 70.0 in | Wt 284.8 lb

## 2022-11-27 DIAGNOSIS — I25118 Atherosclerotic heart disease of native coronary artery with other forms of angina pectoris: Secondary | ICD-10-CM

## 2022-11-27 DIAGNOSIS — E782 Mixed hyperlipidemia: Secondary | ICD-10-CM | POA: Diagnosis not present

## 2022-11-27 DIAGNOSIS — I10 Essential (primary) hypertension: Secondary | ICD-10-CM | POA: Diagnosis not present

## 2022-11-27 NOTE — Patient Instructions (Signed)
Medication Instructions:  STOP Plavix   *If you need a refill on your cardiac medications before your next appointment, please call your pharmacy*  Lab Work: NONE ordered at this time of appointment   If you have labs (blood work) drawn today and your tests are completely normal, you will receive your results only by: [place] (if you have MyChart) OR A paper copy in the mail If you have any lab test that is abnormal or we need to change your treatment, we will call you to review the results.  Testing/Procedures: NONE ordered at this time of appointment   Follow-Up: At [facility] [place], you and your health needs are our priority.  As part of our continuing mission to provide you with exceptional heart care, we have created designated Provider Care Teams.  These Care Teams include your primary Cardiologist (physician) and Advanced Practice Providers (APPs -  Physician Assistants and Nurse Practitioners) who all work together to provide you with the care you need, when you need it.   Your next appointment:   6 month(s)  Provider:   Peter Martinique, MD     Other Instructions

## 2023-01-04 ENCOUNTER — Other Ambulatory Visit: Payer: Self-pay | Admitting: Cardiology

## 2023-02-16 ENCOUNTER — Other Ambulatory Visit: Payer: Self-pay | Admitting: Cardiology

## 2023-03-15 ENCOUNTER — Other Ambulatory Visit (HOSPITAL_COMMUNITY): Payer: Self-pay

## 2023-03-15 ENCOUNTER — Telehealth: Payer: Self-pay

## 2023-03-15 NOTE — Telephone Encounter (Signed)
Pharmacy Patient Advocate Encounter   Received notification from [facility] that prior authorization for Repatha 140mg /ml is required/requested.   PA submitted on 5.13.24 to (ins) [employer] via CoverMyMeds  Key or [facility]) confirmation # [id number]  Status is pending

## 2023-03-15 NOTE — Telephone Encounter (Signed)
Pharmacy Patient Advocate Encounter  Prior Authorization for REPATHA 140MG /ML has been approved by [employer] (ins).    Key: [id number] Effective dates: 5.13.24 through 5.13.25

## 2023-03-16 NOTE — Telephone Encounter (Signed)
Approval letter attached in patients media

## 2023-08-04 ENCOUNTER — Other Ambulatory Visit: Payer: Self-pay | Admitting: Cardiology

## 2023-09-12 NOTE — Progress Notes (Deleted)
Cardiology Office Note    Date:  09/12/2023  ID:  Alex Barnes, DOB [birth year], MRN [id number] PCP:  Richardean Chimera, MD  Cardiologist:  Peter Swaziland, MD  Electrophysiologist:  None   Chief Complaint: Follow up for CAD   History of Present Illness: .    Alex Barnes is a 52 y.o. male with visit-pertinent history of CAD, NSTEMI in 07/2015 with DES to LCX and OM2, RCA CTO at that time medically managed, post MI pericarditis, hyperlipidemia, HTN.   Admitted in 07/2015 with NSTEMI, underwent PCI with DES to circumflex and OM 2, he was noted to have a CTO of the RCA which at that time was managed medically.  His hospital course was complicated by MI pericarditis.  He had chronic angina from right CTO, eventually failed medical therapy and was worried to Dr. Swaziland for complex PCI.  He underwent PCI to his RCA in 09/2021.  He had an unsuccessful PCI of the RV marginal branch due to acute angle takeoff and inability to cross wire, and following his procedure his symptoms improved.  On 07/08/2022 he presented to [facility] with chest pain, he was transferred to [facility] for cardiac catheterization.  His troponins were negative.  His cardiac cath showed similar results to his 2022 catheterization, his chest pain was not felt to be cardiac in nature.  His echo showed normal EF and mild LVH.  Despite cardiac cath his chest pain continued.  He did not have relief with nitroglycerin nor with a GI cocktail.  His sedimentation rate was normal and CRP was normal.  His pain improved with Tylenol and tramadol, ibuprofen was added to his medication regimen.  He was last seen in clinic on 11/27/2022 by Dr. Swaziland.  He had remained stable from a cardiac perspective. His Plavix was discontinued at office visit.   Today he presents for follow up. He reports that he   CAD: NSTEMI in 07/2015 with DES to LCX and OM2, RCA CTO at that time medically managed. Eventually failed medical management and underwent  successful CTO PCI of the distal RCA with IVUS guidance and DES x 1, he did have unsuccessful PCI of the RV marginal branch due to acute angle takeoff and inability to cross with a wire in 09/2021 with Dr. Swaziland.  Cardiac cath on 07/09/2022 in setting of chest discomfort showed no change in coronary anatomy since cath in 2022.  It was felt that chest pain was not cardiac related.  Today he reports   Hyperlipidemia: Last lipid profile on 07/09/2022 indicated total cholesterol 93, triglycerides 133, HDL 22 and LDL 44.  Lipoprotein a 71.4. Check fasting lipid profile and CMET.   Hypertension: Blood pressure today   Labwork independently reviewed:   ROS: .   *** denies chest pain, shortness of breath, lower extremity edema, fatigue, palpitations, melena, hematuria, hemoptysis, diaphoresis, weakness, presyncope, syncope, orthopnea, and PND.  All other systems are reviewed and otherwise negative.  Studies Reviewed: Marland Kitchen    EKG:  EKG is ordered today, personally reviewed, demonstrating ***     CV Studies:  Cardiac Studies & Procedures   CARDIAC CATHETERIZATION  CARDIAC CATHETERIZATION 07/09/2022  Narrative   Acute Mrg lesion is 95% stenosed.   RPAV lesion is 100% stenosed.   Previously placed Dist RCA stent of unknown type is  widely patent.   Previously placed 2nd Mrg stent of unknown type is  widely patent.  No obstructive disease in the large LAD Patent  mid Circumflex/OM stent without restenosis Patent distal RCA stent.  Chronic occlusion right posterolateral branch which fills from left to right collaterals. The moderate caliber RV marginal branch is unchanged from last cath with severe ostial stenosis (this lesion could not be crossed during attempted PCI in 2022). No change in coronary anatomy since last cath in 2022. Chest pain has been continuous for 48 hours with negative troponin. Suspect chest pain is not cardiac related.  Recommendations: Continued medical management of CAD. Chest  pain has been continuous for 48 hours with negative troponin. Suspect chest pain is not cardiac related. Will give a GI cocktail.  Findings Coronary Findings Diagnostic  Dominance: Right  Left Main Vessel is normal in caliber. Vessel is angiographically normal.  Left Anterior Descending Vessel is normal in caliber and large.  Ramus Intermedius Vessel is normal in caliber. The vessel exhibits minimal luminal irregularities.  Left Circumflex  Second Obtuse Marginal Branch Previously placed 2nd Mrg stent of unknown type is  widely patent.  Right Coronary Artery Previously placed Dist RCA stent of unknown type is  widely patent.  Acute Marginal Branch Acute Mrg lesion is 95% stenosed.  Right Posterior Descending Artery Vessel is small in size.  Right Posterior Atrioventricular Artery Vessel is small in size. Collaterals RPAV filled by collaterals from 2nd Sept.  RPAV lesion is 100% stenosed. The lesion is chronically occluded.  Intervention  No interventions have been documented.   CARDIAC CATHETERIZATION  CARDIAC CATHETERIZATION 09/10/2021  Narrative   Acute Mrg lesion is 95% stenosed.   Dist RCA lesion is 100% stenosed.   Non-stenotic 2nd Mrg lesion was previously treated.   A drug-eluting stent was successfully placed using a SYNERGY XD 2.25X28.   Post intervention, there is a 0% residual stenosis.  Successful CTO PCI of the distal RCA with IVUS guidance and DES x 1 Unsuccessful PCI of the RV marginal branch due to acute angled take off and inability to cross with a wire.  Plan: DAPT for one year. Anticipate DC tomorrow.  Findings Coronary Findings Diagnostic  Dominance: Right  Left Main Vessel is normal in caliber. Vessel is angiographically normal.  Left Anterior Descending Vessel is normal in caliber. There is mild diffuse disease throughout the vessel.  Ramus Intermedius Vessel is normal in caliber. The vessel exhibits minimal luminal  irregularities.  Left Circumflex  Second Obtuse Marginal Branch Non-stenotic 2nd Mrg lesion was previously treated.  Right Coronary Artery Dist RCA lesion is 100% stenosed. The lesion is chronically occluded. The lesion is moderately calcified.  Acute Marginal Branch Collaterals Acute Mrg filled by collaterals from Dist LAD.  Acute Mrg lesion is 95% stenosed.  Right Posterior Descending Artery Vessel is small in size.  Right Posterior Atrioventricular Artery Vessel is small in size.  Intervention  Dist RCA lesion Stent CATH MACH1 8FR AL.75  90CM guide catheter was inserted. Lesion crossed with guidewire using a WIRE ASAHI CONFIANZPRO12 300CM. Pre-stent angioplasty was performed using a BALLN [place] 2.50X15. A drug-eluting stent was successfully placed using a SYNERGY XD 2.25X28. Stent strut is well apposed. Post-stent angioplasty was performed using a BALLN [place] EMERGE MR 3.25X8. Maximum pressure:  18 atm. Post-Intervention Lesion Assessment The intervention was successful. Intentional subintimal strategy was not used. Pre-interventional TIMI flow is 0. Post-intervention TIMI flow is 3. No complications occurred at this lesion. There is a 0% residual stenosis post intervention.   STRESS TESTS  NM MYOCAR MULTI W/SPECT W 04/01/2017  Narrative  Blood pressure demonstrated a normal response to  exercise. Duke treadmill score of 9 consistent with low risk for major cardiac events  There was no ST segment deviation noted during stress.  Findings consistent with prior inferior myocardial infarction with very mild peri-infarct ischemia.  This is a low risk study. Particularly when considering good functional capacity and low risk Duke treadmill score.  The left ventricular ejection fraction is normal (55-65%).   ECHOCARDIOGRAM  ECHOCARDIOGRAM COMPLETE 07/09/2022  Narrative ECHOCARDIOGRAM REPORT    Patient Name:   Alex Barnes Date of Exam: 07/09/2022 Medical Rec #:   [id number]      Height:       70.0 in Accession #:    [id number]     Weight:       293.6 lb Date of Birth:  Dec 05, 1970      BSA:          2.457 m Patient Age:    51 years       BP:           132/56 mmHg Patient Gender: M              HR:           58 bpm. Exam Location:  Inpatient  Procedure: 2D Echo, Cardiac Doppler, Color Doppler and Intracardiac Opacification Agent  Indications:    Chest Pain  History:        Patient has prior history of Echocardiogram examinations, most recent 05/22/2021. Previous Myocardial Infarction and CAD.  Sonographer:    Milda Smart Referring Phys: 33 DAYNA N DUNN   Sonographer Comments: Technically difficult study due to poor echo windows. Image acquisition challenging due to patient body habitus and Image acquisition challenging due to respiratory motion. IMPRESSIONS   1. Left ventricular ejection fraction, by estimation, is 60 to 65%. The left ventricle has normal function. The left ventricle has no regional wall motion abnormalities. There is mild left ventricular hypertrophy. Left ventricular diastolic parameters were normal. 2. Right ventricular systolic function is normal. The right ventricular size is normal. 3. The mitral valve is normal in structure. No evidence of mitral valve regurgitation. No evidence of mitral stenosis. 4. The aortic valve has an indeterminant number of cusps. Aortic valve regurgitation is mild. No aortic stenosis is present.  Comparison(s): No significant change from prior study.  FINDINGS Left Ventricle: Left ventricular ejection fraction, by estimation, is 60 to 65%. The left ventricle has normal function. The left ventricle has no regional wall motion abnormalities. Definity contrast agent was given IV to delineate the left ventricular endocardial borders. The left ventricular internal cavity size was normal in size. There is mild left ventricular hypertrophy. Left ventricular diastolic parameters were  normal.  Right Ventricle: The right ventricular size is normal. Right ventricular systolic function is normal.  Left Atrium: Left atrial size was normal in size.  Right Atrium: Right atrial size was normal in size.  Pericardium: There is no evidence of pericardial effusion.  Mitral Valve: The mitral valve is normal in structure. No evidence of mitral valve regurgitation. No evidence of mitral valve stenosis.  Tricuspid Valve: The tricuspid valve is normal in structure. Tricuspid valve regurgitation is not demonstrated. No evidence of tricuspid stenosis.  Aortic Valve: The aortic valve has an indeterminant number of cusps. Aortic valve regurgitation is mild. Aortic regurgitation PHT measures 405 msec. No aortic stenosis is present.  Pulmonic Valve: The pulmonic valve was normal in structure. Pulmonic valve regurgitation is not visualized. No evidence of pulmonic stenosis.  Aorta: The aortic  root is normal in size and structure.  Venous: The inferior vena cava was not well visualized.  IAS/Shunts: The interatrial septum was not well visualized.   LEFT VENTRICLE PLAX 2D LVIDd:         4.40 cm      Diastology LVIDs:         3.70 cm      LV e' medial:    8.11 cm/s LV PW:         1.35 cm      LV E/e' medial:  9.5 LV IVS:        1.35 cm      LV e' lateral:   12.60 cm/s LVOT diam:     2.50 cm      LV E/e' lateral: 6.1 LV SV:         112 LV SV Index:   46 LVOT Area:     4.91 cm  LV Volumes (MOD) LV vol d, MOD A2C: 156.0 ml LV vol d, MOD A4C: 207.0 ml LV vol s, MOD A2C: 52.8 ml LV vol s, MOD A4C: 99.7 ml LV SV MOD A2C:     103.2 ml LV SV MOD A4C:     207.0 ml LV SV MOD BP:      109.1 ml  RIGHT VENTRICLE RV S prime:     9.24 cm/s TAPSE (M-mode): 2.0 cm  LEFT ATRIUM             Index        RIGHT ATRIUM           Index LA diam:        4.30 cm 1.75 cm/m   RA Area:     15.10 cm LA Vol (A2C):   55.2 ml 22.46 ml/m  RA Volume:   37.40 ml  15.22 ml/m LA Vol (A4C):   58.7 ml  23.89 ml/m LA Biplane Vol: 59.9 ml 24.38 ml/m AORTIC VALVE LVOT Vmax:   103.00 cm/s LVOT Vmean:  76.700 cm/s LVOT VTI:    0.229 m AI PHT:      405 msec  AORTA Ao Root diam: 3.00 cm Ao Asc diam:  3.10 cm  MITRAL VALVE MV Area (PHT): 3.81 cm    SHUNTS MV Decel Time: 199 msec    Systemic VTI:  0.23 m MV E velocity: 76.70 cm/s  Systemic Diam: 2.50 cm MV A velocity: 36.80 cm/s MV E/A ratio:  2.08  Olga Millers MD Electronically signed by Olga Millers MD Signature Date/Time: 07/09/2022/12:30:07 PM    Final               Current Reported Medications:.    No outpatient medications have been marked as taking for the 09/13/23 encounter (Appointment) with Rip Harbour, NP.    Physical Exam:    VS:  There were no vitals taken for this visit.   Wt Readings from Last 3 Encounters:  11/27/22 284 lb 12.8 oz (129.2 kg)  07/28/22 289 lb 9.6 oz (131.4 kg)  07/08/22 293 lb 9.6 oz (133.2 kg)    GEN: Well nourished, well developed in no acute distress NECK: No JVD; No carotid bruits CARDIAC: ***RRR, no murmurs, rubs, gallops RESPIRATORY:  Clear to auscultation without rales, wheezing or rhonchi  ABDOMEN: Soft, non-tender, non-distended EXTREMITIES:  No edema; No acute deformity   Asessement and Plan:.     ***     Disposition: F/u with ***  Signed, Rip Harbour, NP

## 2023-09-13 ENCOUNTER — Ambulatory Visit: Payer: BC Managed Care – PPO | Attending: Cardiology | Admitting: Cardiology

## 2023-09-13 DIAGNOSIS — E782 Mixed hyperlipidemia: Secondary | ICD-10-CM

## 2023-09-13 DIAGNOSIS — I2510 Atherosclerotic heart disease of native coronary artery without angina pectoris: Secondary | ICD-10-CM

## 2023-09-13 DIAGNOSIS — I10 Essential (primary) hypertension: Secondary | ICD-10-CM

## 2023-10-06 LAB — COLOGUARD: COLOGUARD: NEGATIVE

## 2023-10-06 LAB — EXTERNAL GENERIC LAB PROCEDURE: COLOGUARD: NEGATIVE

## 2023-10-14 ENCOUNTER — Telehealth: Payer: Self-pay | Admitting: Cardiology

## 2023-10-14 MED ORDER — METOPROLOL TARTRATE 50 MG PO TABS: 50.0000 mg | ORAL_TABLET | Freq: Two times a day (BID) | ORAL | 0 refills | Status: DC

## 2023-10-14 NOTE — Telephone Encounter (Signed)
*  STAT* If patient is at the pharmacy, call can be transferred to refill team.   1. Which medications need to be refilled? (please list name of each medication and dose if known)   metoprolol tartrate (LOPRESSOR) 50 MG tablet   2. Would you like to learn more about the convenience, safety, & potential cost savings by using the [facility]?   3. Are you open to using the [facility] (Type [facility]. ).  4. Which pharmacy/location (including street and city if local pharmacy) is medication to be sent to?  [id number] - [place], [place] - [address]   5. Do they need a 30 day or 90 day supply?   90 day  Patient stated he has some medication left.

## 2023-10-14 NOTE — Telephone Encounter (Signed)
Refill faxed to the pharmacy

## 2023-11-04 ENCOUNTER — Other Ambulatory Visit: Payer: Self-pay | Admitting: Cardiology

## 2023-11-26 ENCOUNTER — Other Ambulatory Visit: Payer: Self-pay | Admitting: Cardiology

## 2023-11-27 ENCOUNTER — Other Ambulatory Visit: Payer: Self-pay | Admitting: Cardiology

## 2023-12-07 NOTE — Progress Notes (Deleted)
 Interventional cardiology note:   Date:  12/07/2023   ID:  Alex Barnes, DOB 1971/10/13, MRN [id number]  PCP:  Alex Chimera, MD  Cardiologist:   Dekisha Mesmer [place], MD   No chief complaint on file.      History of Present Illness: Alex Barnes is a 53 y.o. male who is seen for follow up CAD. He had a Cardiac catheterization in 2016 with totally occluded RCA PL branch as well as significant LCx and OM disease.  He had DES to LCx and OM 2, PL occlusion managed medically. Echocardiogram 2016 LVEF 55 to 60%, no WMA's. Treated for post MI pericarditis with colchicine aspirin and NSAIDs. On follow up he had persistent chest pain despite medical therapy. Last Myoview in 2018 showed inferior infarct with peri infarct ischemia and normal EF at good exercise tolerance. Echo in July showed Normal LV function. He apparently had cardiac cath in May 2021 at [facility] showing occlusion of RCA without any new disease.   When seen previously he continued to have significant lifestyle limiting chest pain. For this reason he underwent CTO PCI of the distal RCA on 09/10/21. This was successful. Unable to pass a wire into the RV marginal branch.   On subsequent follow he noted that his anginal symptoms were markedly better.  He is able to do things now that he couldn't before and he feels it is life changing.   He presented 07/08/2022 with chest pain to [facility].  His chest pain and presentation prompted transfer to [facility] for cardiac catheterization.  His troponins were negative, EKG showed no acute changes in sinus rhythm.   He underwent cardiac catheterization and his cardiac cath showed similar results to his 2022 catheterization.  His chest pain was not felt to be cardiac in nature.  His echo showed normal EF and mild LVH.  Despite his cath his chest pain continued.  Nitroglycerin did not provide relief.  He did not receive relief from GI cocktail.  His sedimentation rate was normal and  CRP were normal.  His pain improved with Tylenol and tramadol.   When seen back in office his chest pain was better with prn NSAIDs.   On follow up he is doing well. He denies any chest pain. No SOB. He did lose about 15 lbs but gained more back over the holidays. He is walking regularly. Feels very well.   Past Medical History:  Diagnosis Date   CAD (coronary artery disease)    cath 9/26 & 07/31/2015 100% total occlusion of post atrio branch of RCA, LV supplied by collaterals from the L atrial recurrent branch of distal LCx, 75-80% mid to distal LCx stenosis, high grade complex stenosis of OM2, patent LAD and RI, EF 55-60%. On 9/8, single DES to midLCx extending into OM2.   Headache    "weekly" (07/29/2015)   HTN (hypertension) 07/11/2022   Hypercholesteremia    NSTEMI (non-ST elevated myocardial infarction) (HCC) 07/28/2015    Past Surgical History:  Procedure Laterality Date   CARDIAC CATHETERIZATION  2006; 07/29/2015   CARDIAC CATHETERIZATION N/A 07/29/2015   Procedure: Left Heart Cath and Coronary Angiography;  Surgeon: Lyn Records, MD;  Location: [facility] INVASIVE CV LAB;  Service: Cardiovascular;  Laterality: N/A;   CARDIAC CATHETERIZATION N/A 07/31/2015   Procedure: Coronary Stent Intervention;  Surgeon: Iran Ouch, MD;  Location: [facility] INVASIVE CV LAB;  Service: Cardiovascular;  Laterality: N/A;   CARDIAC CATHETERIZATION N/A 07/31/2015  Procedure: Left Heart Cath and Coronary Angiography;  Surgeon: Iran Ouch, MD;  Location: [facility] INVASIVE CV LAB;  Service: Cardiovascular;  Laterality: N/A;   CHOLECYSTECTOMY     CORONARY CTO INTERVENTION N/A 09/10/2021   Procedure: CORONARY CTO INTERVENTION;  Surgeon: [place], [name], MD;  Location: [facility] INVASIVE CV LAB;  Service: Cardiovascular;  Laterality: N/A;   CORONARY ULTRASOUND/IVUS N/A 09/10/2021   Procedure: Intravascular Ultrasound/IVUS;  Surgeon: [place], [name], MD;  Location: [facility] INVASIVE CV LAB;  Service: Cardiovascular;  Laterality: N/A;    LEFT HEART CATH AND CORONARY ANGIOGRAPHY N/A 08/29/2021   Procedure: LEFT HEART CATH AND CORONARY ANGIOGRAPHY;  Surgeon: [place], [name], MD;  Location: [facility] INVASIVE CV LAB;  Service: Cardiovascular;  Laterality: N/A;   LEFT HEART CATH AND CORONARY ANGIOGRAPHY N/A 07/09/2022   Procedure: LEFT HEART CATH AND CORONARY ANGIOGRAPHY;  Surgeon: Kathleene Hazel, MD;  Location: [facility] INVASIVE CV LAB;  Service: Cardiovascular;  Laterality: N/A;   TONSILLECTOMY  ~ 1989   WISDOM TOOTH EXTRACTION  1993     Current Outpatient Medications  Medication Sig Dispense Refill   amLODipine (NORVASC) 5 MG tablet Take 1 tablet (5 mg total) by mouth daily. Please keep upcoming Feb appt for further refills. Thank you 30 tablet 0   aspirin 81 MG chewable tablet Chew 1 tablet (81 mg total) by mouth daily.     Evolocumab (REPATHA SURECLICK) 140 MG/ML SOAJ INJECT 140 MG INTO THE SKIN EVERY 14 (FOURTEEN) DAYS. 6 mL 3   lisinopril (ZESTRIL) 2.5 MG tablet TAKE 2 TABLETS BY MOUTH EVERY DAY 180 tablet 3   metoprolol tartrate (LOPRESSOR) 50 MG tablet Take 1 tablet (50 mg total) by mouth 2 (two) times daily. Please call [phone] to schedule an appointment for future refills. Thank you. 180 tablet 0   nitroGLYCERIN (NITROSTAT) 0.4 MG SL tablet Place 1 tablet (0.4 mg total) under the tongue every 5 (five) minutes as needed for chest pain. 25 tablet 3   pantoprazole (PROTONIX) 40 MG tablet Take 40 mg by mouth daily.     rosuvastatin (CRESTOR) 10 MG tablet TAKE 1 TABLET BY MOUTH EVERY DAY 90 tablet 1   testosterone cypionate (DEPOTESTOSTERONE CYPIONATE) 200 MG/ML injection Inject 200 mg into the muscle every 14 (fourteen) days.     No current facility-administered medications for this visit.    Allergies:   Patient has no known allergies.    Social History:  The patient  reports that he has been smoking cigarettes and cigars. He started smoking about 13 years ago. He has a 5 pack-year smoking history. He has quit using  smokeless tobacco. He reports current alcohol use. He reports that he does not use drugs.   Family History:  The patient's family history includes Cancer in his father.    ROS:  Please see the history of present illness.   Otherwise, review of systems are positive for none.   All other systems are reviewed and negative.    PHYSICAL EXAM: VS:  There were no vitals taken for this visit. , BMI There is no height or weight on file to calculate BMI. GEN: Well nourished, overweight, in no acute distress HEENT: normal Neck: no JVD, carotid bruits, or masses Cardiac: RRR; no murmurs, rubs, or gallops,no edema  Respiratory:  clear to auscultation bilaterally, normal work of breathing GI: soft, nontender, nondistended, + BS MS: no deformity or atrophy Skin: warm and dry, no rash Neuro:  Strength and sensation are intact Psych: euthymic mood,  full affect   EKG:  EKG is not ordered today.   Recent Labs: No results found for requested labs within last 365 days.   Dated 03/03/21: cholesterol 222, triglycerides 151, HDL 38, LDL 156. Normal CBC, CMET, TSH Dated 11/16/22: cholesterol 117, triglycerides 101, HDL 37, CMET with AST 41, ALT 75. Otherwise normal. A1c 6.2%.     Lipid Panel    Component Value Date/Time   CHOL 93 07/09/2022 0705   CHOL 148 08/28/2021 1041   TRIG 133 07/09/2022 0705   HDL 22 (L) 07/09/2022 0705   HDL 35 (L) 08/28/2021 1041   CHOLHDL 4.2 07/09/2022 0705   VLDL 27 07/09/2022 0705   LDLCALC 44 07/09/2022 0705   LDLCALC 97 08/28/2021 1041      Wt Readings from Last 3 Encounters:  11/27/22 284 lb 12.8 oz (129.2 kg)  07/28/22 289 lb 9.6 oz (131.4 kg)  07/08/22 293 lb 9.6 oz (133.2 kg)      Other studies Reviewed: Additional studies/ records that were reviewed today include: .  Echo 05/22/21: IMPRESSIONS     1. Left ventricular ejection fraction, by estimation, is 60 to 65%. The  left ventricle has normal function. The left ventricle has no regional  wall  motion abnormalities. There is mild left ventricular hypertrophy.  Left ventricular diastolic parameters  are consistent with Grade I diastolic dysfunction (impaired relaxation).   2. Right ventricular systolic function is normal. The right ventricular  size is normal.   3. The mitral valve is normal in structure. No evidence of mitral valve  regurgitation. No evidence of mitral stenosis.   4. The aortic valve has an indeterminant number of cusps. Aortic valve  regurgitation is mild.   Comparison(s): Previous Echo showed LV EF 55-60%, no RWMA, mild LVH.   CTO PCI 09/10/21: Procedures  CORONARY CTO INTERVENTION  Intravascular Ultrasound/IVUS   Conclusion      Acute Mrg lesion is 95% stenosed.   Dist RCA lesion is 100% stenosed.   Non-stenotic 2nd Mrg lesion was previously treated.   A drug-eluting stent was successfully placed using a SYNERGY XD 2.25X28.   Post intervention, there is a 0% residual stenosis.   Successful CTO PCI of the distal RCA with IVUS guidance and DES x 1 Unsuccessful PCI of the RV marginal branch due to acute angled take off and inability to cross with a wire.    Plan: DAPT for one year. Anticipate DC tomorrow.   Diagnostic Dominance: Right Intervention   Cardiac catheterization 07/09/2022     Acute Mrg lesion is 95% stenosed.   RPAV lesion is 100% stenosed.   Previously placed Dist RCA stent of unknown type is  widely patent.   Previously placed 2nd Mrg stent of unknown type is  widely patent.   No obstructive disease in the large LAD Patent mid Circumflex/OM stent without restenosis Patent distal RCA stent.  Chronic occlusion right posterolateral branch which fills from left to right collaterals. The moderate caliber RV marginal branch is unchanged from last cath with severe ostial stenosis (this lesion could not be crossed during attempted PCI in 2022).  No change in coronary anatomy since last cath in 2022.  Chest pain has been continuous for 48  hours with negative troponin. Suspect chest pain is not cardiac related.    Recommendations: Continued medical management of CAD. Chest pain has been continuous for 48 hours with negative troponin. Suspect chest pain is not cardiac related. Will give a GI cocktail.  Diagnostic Dominance: Right  Intervention   Echo 07/09/22: IMPRESSIONS     1. Left ventricular ejection fraction, by estimation, is 60 to 65%. The  left ventricle has normal function. The left ventricle has no regional  wall motion abnormalities. There is mild left ventricular hypertrophy.  Left ventricular diastolic parameters  were normal.   2. Right ventricular systolic function is normal. The right ventricular  size is normal.   3. The mitral valve is normal in structure. No evidence of mitral valve  regurgitation. No evidence of mitral stenosis.   4. The aortic valve has an indeterminant number of cusps. Aortic valve  regurgitation is mild. No aortic stenosis is present.   Comparison(s): No significant change from prior study.    ASSESSMENT AND PLAN:  1.  CAD  s/p remote stent of OM in 2016. S/p successful CTO PCI of the distal RCA in 2022.  Now class 1. Cardiac cath repeat in September 2023 was stable.  Can discontinue Plavix at this point. Continue other therapy.  2. Hypercholesterolemia. Now on Repatha and Crestor 10 mg daily. Lipids are at goal.   3. HTN controlled.   Disposition:  follow up in 6 months.  Signed, Briseidy Spark [place], MD  12/07/2023 8:02 AM    [facility] [address] [place], [place], [address] Phone [phone], Fax [phone]

## 2023-12-13 ENCOUNTER — Ambulatory Visit: Payer: BC Managed Care – PPO | Admitting: Cardiology

## 2023-12-25 NOTE — Progress Notes (Unsigned)
 Interventional cardiology note:   Date:  12/25/2023   ID:  Alex Barnes, DOB 1971/08/24, MRN [id number]  PCP:  Richardean Chimera, MD  Cardiologist:   Saydie Gerdts [place], MD   No chief complaint on file.      History of Present Illness: Alex Barnes is a 53 y.o. male who is seen for follow up CAD. He had a Cardiac catheterization in 2016 with totally occluded RCA PL branch as well as significant LCx and OM disease.  He had DES to LCx and OM 2, PL occlusion managed medically. Echocardiogram 2016 LVEF 55 to 60%, no WMA's. Treated for post MI pericarditis with colchicine aspirin and NSAIDs. On follow up he had persistent chest pain despite medical therapy. Last Myoview in 2018 showed inferior infarct with peri infarct ischemia and normal EF at good exercise tolerance. Echo in July showed Normal LV function. He apparently had cardiac cath in May 2021 at [facility] showing occlusion of RCA without any new disease.   When seen previously he continued to have significant lifestyle limiting chest pain. For this reason he underwent CTO PCI of the distal RCA on 09/10/21. This was successful. Unable to pass a wire into the RV marginal branch.   On subsequent follow he noted that his anginal symptoms were markedly better.  He is able to do things now that he couldn't before and he feels it is life changing.   He presented 07/08/2022 with chest pain to [facility].  His chest pain and presentation prompted transfer to [facility] for cardiac catheterization.  His troponins were negative, EKG showed no acute changes in sinus rhythm.   He underwent cardiac catheterization and his cardiac cath showed similar results to his 2022 catheterization.  His chest pain was not felt to be cardiac in nature.  His echo showed normal EF and mild LVH.  Despite his cath his chest pain continued.  Nitroglycerin did not provide relief.  He did not receive relief from GI cocktail.  His sedimentation rate was normal  and CRP were normal.  His pain improved with Tylenol and tramadol.   When seen back in office his chest pain was better with prn NSAIDs.   On follow up he is doing well. He denies any chest pain. No SOB. He did lose about 15 lbs but gained more back over the holidays. He is walking regularly. Feels very well.   Past Medical History:  Diagnosis Date   CAD (coronary artery disease)    cath 9/26 & 07/31/2015 100% total occlusion of post atrio branch of RCA, LV supplied by collaterals from the L atrial recurrent branch of distal LCx, 75-80% mid to distal LCx stenosis, high grade complex stenosis of OM2, patent LAD and RI, EF 55-60%. On 9/8, single DES to midLCx extending into OM2.   Headache    "weekly" (07/29/2015)   HTN (hypertension) 07/11/2022   Hypercholesteremia    NSTEMI (non-ST elevated myocardial infarction) (HCC) 07/28/2015    Past Surgical History:  Procedure Laterality Date   CARDIAC CATHETERIZATION  2006; 07/29/2015   CARDIAC CATHETERIZATION N/A 07/29/2015   Procedure: Left Heart Cath and Coronary Angiography;  Surgeon: Lyn Records, MD;  Location: [facility] INVASIVE CV LAB;  Service: Cardiovascular;  Laterality: N/A;   CARDIAC CATHETERIZATION N/A 07/31/2015   Procedure: Coronary Stent Intervention;  Surgeon: Iran Ouch, MD;  Location: [facility] INVASIVE CV LAB;  Service: Cardiovascular;  Laterality: N/A;   CARDIAC CATHETERIZATION N/A 07/31/2015  Procedure: Left Heart Cath and Coronary Angiography;  Surgeon: Iran Ouch, MD;  Location: [facility] INVASIVE CV LAB;  Service: Cardiovascular;  Laterality: N/A;   CHOLECYSTECTOMY     CORONARY CTO INTERVENTION N/A 09/10/2021   Procedure: CORONARY CTO INTERVENTION;  Surgeon: [place], Annelie Boak M, MD;  Location: [facility] INVASIVE CV LAB;  Service: Cardiovascular;  Laterality: N/A;   CORONARY ULTRASOUND/IVUS N/A 09/10/2021   Procedure: Intravascular Ultrasound/IVUS;  Surgeon: [place], Taejah Ohalloran M, MD;  Location: [facility] INVASIVE CV LAB;  Service: Cardiovascular;  Laterality: N/A;    LEFT HEART CATH AND CORONARY ANGIOGRAPHY N/A 08/29/2021   Procedure: LEFT HEART CATH AND CORONARY ANGIOGRAPHY;  Surgeon: [place], Jushua Waltman M, MD;  Location: [facility] INVASIVE CV LAB;  Service: Cardiovascular;  Laterality: N/A;   LEFT HEART CATH AND CORONARY ANGIOGRAPHY N/A 07/09/2022   Procedure: LEFT HEART CATH AND CORONARY ANGIOGRAPHY;  Surgeon: Kathleene Hazel, MD;  Location: [facility] INVASIVE CV LAB;  Service: Cardiovascular;  Laterality: N/A;   TONSILLECTOMY  ~ 1989   WISDOM TOOTH EXTRACTION  1993     Current Outpatient Medications  Medication Sig Dispense Refill   amLODipine (NORVASC) 5 MG tablet Take 1 tablet (5 mg total) by mouth daily. Please keep upcoming Feb appt for further refills. Thank you 30 tablet 0   aspirin 81 MG chewable tablet Chew 1 tablet (81 mg total) by mouth daily.     Evolocumab (REPATHA SURECLICK) 140 MG/ML SOAJ INJECT 140 MG INTO THE SKIN EVERY 14 (FOURTEEN) DAYS. 6 mL 3   lisinopril (ZESTRIL) 2.5 MG tablet TAKE 2 TABLETS BY MOUTH EVERY DAY 180 tablet 3   metoprolol tartrate (LOPRESSOR) 50 MG tablet Take 1 tablet (50 mg total) by mouth 2 (two) times daily. Please call [phone] to schedule an appointment for future refills. Thank you. 180 tablet 0   nitroGLYCERIN (NITROSTAT) 0.4 MG SL tablet Place 1 tablet (0.4 mg total) under the tongue every 5 (five) minutes as needed for chest pain. 25 tablet 3   pantoprazole (PROTONIX) 40 MG tablet Take 40 mg by mouth daily.     rosuvastatin (CRESTOR) 10 MG tablet TAKE 1 TABLET BY MOUTH EVERY DAY 90 tablet 1   testosterone cypionate (DEPOTESTOSTERONE CYPIONATE) 200 MG/ML injection Inject 200 mg into the muscle every 14 (fourteen) days.     No current facility-administered medications for this visit.    Allergies:   Patient has no known allergies.    Social History:  The patient  reports that he has been smoking cigarettes and cigars. He started smoking about 13 years ago. He has a 5 pack-year smoking history. He has quit using  smokeless tobacco. He reports current alcohol use. He reports that he does not use drugs.   Family History:  The patient's family history includes Cancer in his father.    ROS:  Please see the history of present illness.   Otherwise, review of systems are positive for none.   All other systems are reviewed and negative.    PHYSICAL EXAM: VS:  There were no vitals taken for this visit. , BMI There is no height or weight on file to calculate BMI. GEN: Well nourished, overweight, in no acute distress HEENT: normal Neck: no JVD, carotid bruits, or masses Cardiac: RRR; no murmurs, rubs, or gallops,no edema  Respiratory:  clear to auscultation bilaterally, normal work of breathing GI: soft, nontender, nondistended, + BS MS: no deformity or atrophy Skin: warm and dry, no rash Neuro:  Strength and sensation are intact Psych: euthymic mood,  full affect   EKG:  EKG is not ordered today.   Recent Labs: No results found for requested labs within last 365 days.   Dated 03/03/21: cholesterol 222, triglycerides 151, HDL 38, LDL 156. Normal CBC, CMET, TSH Dated 11/16/22: cholesterol 117, triglycerides 101, HDL 37, CMET with AST 41, ALT 75. Otherwise normal. A1c 6.2%.  Dated 06/01/23: cholesterol 177, triglycerides 135, HDL 43. A1c 6.1%. CMET and TSH normal.    Lipid Panel    Component Value Date/Time   CHOL 93 07/09/2022 0705   CHOL 148 08/28/2021 1041   TRIG 133 07/09/2022 0705   HDL 22 (L) 07/09/2022 0705   HDL 35 (L) 08/28/2021 1041   CHOLHDL 4.2 07/09/2022 0705   VLDL 27 07/09/2022 0705   LDLCALC 44 07/09/2022 0705   LDLCALC 97 08/28/2021 1041      Wt Readings from Last 3 Encounters:  11/27/22 284 lb 12.8 oz (129.2 kg)  07/28/22 289 lb 9.6 oz (131.4 kg)  07/08/22 293 lb 9.6 oz (133.2 kg)      Other studies Reviewed: Additional studies/ records that were reviewed today include: .  Echo 05/22/21: IMPRESSIONS     1. Left ventricular ejection fraction, by estimation, is 60 to  65%. The  left ventricle has normal function. The left ventricle has no regional  wall motion abnormalities. There is mild left ventricular hypertrophy.  Left ventricular diastolic parameters  are consistent with Grade I diastolic dysfunction (impaired relaxation).   2. Right ventricular systolic function is normal. The right ventricular  size is normal.   3. The mitral valve is normal in structure. No evidence of mitral valve  regurgitation. No evidence of mitral stenosis.   4. The aortic valve has an indeterminant number of cusps. Aortic valve  regurgitation is mild.   Comparison(s): Previous Echo showed LV EF 55-60%, no RWMA, mild LVH.   CTO PCI 09/10/21: Procedures  CORONARY CTO INTERVENTION  Intravascular Ultrasound/IVUS   Conclusion      Acute Mrg lesion is 95% stenosed.   Dist RCA lesion is 100% stenosed.   Non-stenotic 2nd Mrg lesion was previously treated.   A drug-eluting stent was successfully placed using a SYNERGY XD 2.25X28.   Post intervention, there is a 0% residual stenosis.   Successful CTO PCI of the distal RCA with IVUS guidance and DES x 1 Unsuccessful PCI of the RV marginal branch due to acute angled take off and inability to cross with a wire.    Plan: DAPT for one year. Anticipate DC tomorrow.   Diagnostic Dominance: Right Intervention   Cardiac catheterization 07/09/2022     Acute Mrg lesion is 95% stenosed.   RPAV lesion is 100% stenosed.   Previously placed Dist RCA stent of unknown type is  widely patent.   Previously placed 2nd Mrg stent of unknown type is  widely patent.   No obstructive disease in the large LAD Patent mid Circumflex/OM stent without restenosis Patent distal RCA stent.  Chronic occlusion right posterolateral branch which fills from left to right collaterals. The moderate caliber RV marginal branch is unchanged from last cath with severe ostial stenosis (this lesion could not be crossed during attempted PCI in 2022).  No change  in coronary anatomy since last cath in 2022.  Chest pain has been continuous for 48 hours with negative troponin. Suspect chest pain is not cardiac related.    Recommendations: Continued medical management of CAD. Chest pain has been continuous for 48 hours with negative troponin. Suspect  chest pain is not cardiac related. Will give a GI cocktail.    Diagnostic Dominance: Right  Intervention   Echo 07/09/22: IMPRESSIONS     1. Left ventricular ejection fraction, by estimation, is 60 to 65%. The  left ventricle has normal function. The left ventricle has no regional  wall motion abnormalities. There is mild left ventricular hypertrophy.  Left ventricular diastolic parameters  were normal.   2. Right ventricular systolic function is normal. The right ventricular  size is normal.   3. The mitral valve is normal in structure. No evidence of mitral valve  regurgitation. No evidence of mitral stenosis.   4. The aortic valve has an indeterminant number of cusps. Aortic valve  regurgitation is mild. No aortic stenosis is present.   Comparison(s): No significant change from prior study.    ASSESSMENT AND PLAN:  1.  CAD  s/p remote stent of OM in 2016. S/p successful CTO PCI of the distal RCA in 2022.  Now class 1. Cardiac cath repeat in September 2023 was stable.  Can discontinue Plavix at this point. Continue other therapy.  2. Hypercholesterolemia. Now on Repatha and Crestor 10 mg daily. Lipids are at goal.   3. HTN controlled.   Disposition:  follow up in 6 months.  Signed, Galit Urich [place], MD  12/25/2023 8:34 AM    [facility] [address] [place], [place], [address] Phone [phone], Fax [phone]

## 2023-12-26 ENCOUNTER — Other Ambulatory Visit: Payer: Self-pay | Admitting: Cardiology

## 2023-12-31 ENCOUNTER — Ambulatory Visit: Payer: BC Managed Care – PPO | Attending: Cardiology | Admitting: Cardiology

## 2023-12-31 ENCOUNTER — Encounter: Payer: Self-pay | Admitting: Cardiology

## 2023-12-31 VITALS — BP 146/90 | HR 61 | Ht 70.0 in | Wt 296.0 lb

## 2023-12-31 DIAGNOSIS — G459 Transient cerebral ischemic attack, unspecified: Secondary | ICD-10-CM | POA: Diagnosis not present

## 2023-12-31 DIAGNOSIS — I25119 Atherosclerotic heart disease of native coronary artery with unspecified angina pectoris: Secondary | ICD-10-CM | POA: Diagnosis not present

## 2023-12-31 DIAGNOSIS — R001 Bradycardia, unspecified: Secondary | ICD-10-CM

## 2023-12-31 DIAGNOSIS — I25110 Atherosclerotic heart disease of native coronary artery with unstable angina pectoris: Secondary | ICD-10-CM

## 2023-12-31 DIAGNOSIS — I209 Angina pectoris, unspecified: Secondary | ICD-10-CM

## 2023-12-31 DIAGNOSIS — I214 Non-ST elevation (NSTEMI) myocardial infarction: Secondary | ICD-10-CM | POA: Diagnosis not present

## 2023-12-31 MED ORDER — AMLODIPINE BESYLATE 10 MG PO TABS
10.0000 mg | ORAL_TABLET | Freq: Every day | ORAL | 3 refills | Status: AC
Start: 2023-12-31 — End: 2024-03-30

## 2023-12-31 NOTE — Patient Instructions (Addendum)
 Medication Instructions:  Increase Amlodipine to 10 mg daily Continue all other medications *If you need a refill on your cardiac medications before your next appointment, please call your pharmacy*   Lab Work: None ordered   Testing/Procedures: None ordered   Follow-Up: At [facility], you and your health needs are our priority.  As part of our continuing mission to provide you with exceptional heart care, we have created designated Provider Care Teams.  These Care Teams include your primary Cardiologist (physician) and Advanced Practice Providers (APPs -  Physician Assistants and Nurse Practitioners) who all work together to provide you with the care you need, when you need it.  We recommend signing up for the patient portal called "MyChart".  Sign up information is provided on this After Visit Summary.  MyChart is used to connect with patients for Virtual Visits (Telemedicine).  Patients are able to view lab/test results, encounter notes, upcoming appointments, etc.  Non-urgent messages can be sent to your provider as well.   To learn more about what you can do with MyChart, go to [email or web address].    Your next appointment:  1 year    Call in Nov to schedule Feb appointment     Provider:  Dr.Jordan

## 2024-01-09 ENCOUNTER — Other Ambulatory Visit: Payer: Self-pay | Admitting: Cardiology

## 2024-01-30 ENCOUNTER — Other Ambulatory Visit: Payer: Self-pay | Admitting: Cardiology

## 2024-03-01 ENCOUNTER — Other Ambulatory Visit: Payer: Self-pay | Admitting: Cardiology

## 2024-04-09 ENCOUNTER — Other Ambulatory Visit: Payer: Self-pay | Admitting: Cardiology

## 2024-10-29 ENCOUNTER — Other Ambulatory Visit: Payer: Self-pay | Admitting: Cardiology

## 2024-11-04 ENCOUNTER — Other Ambulatory Visit: Payer: Self-pay | Admitting: Cardiology

## 2024-11-30 ENCOUNTER — Telehealth: Payer: Self-pay | Admitting: Cardiology

## 2024-11-30 MED ORDER — LISINOPRIL 2.5 MG PO TABS: 5.0000 mg | ORAL_TABLET | Freq: Every day | ORAL | 0 refills | Status: AC

## 2024-11-30 NOTE — Telephone Encounter (Signed)
 Appt scheduled for 03/09. 90 day rx sent to last pt until appt.

## 2024-11-30 NOTE — Telephone Encounter (Signed)
" °*  STAT* If patient is at the pharmacy, call can be transferred to refill team.   1. Which medications need to be refilled? (please list name of each medication and dose if known)   lisinopril  (ZESTRIL ) 2.5 MG tablet     2. Would you like to learn more about the convenience, safety, & potential cost savings by using the [facility]?    3. Are you open to using the [facility] (Type [facility].    4. Which pharmacy/location (including street and city if local pharmacy) is medication to be sent to?   [id number] - [place], [place] - [address]     5. Do they need a 30 day or 90 day supply? 90  "

## 2025-01-08 ENCOUNTER — Ambulatory Visit: Admitting: Cardiology
